# Patient Record
Sex: Male | Born: 1960 | Race: Asian | Hispanic: No | Marital: Single | Smoking: Former smoker
Health system: Southern US, Community
[De-identification: ages and names within clinical notes are randomized; demographics above are authoritative.]

## PROBLEM LIST (undated history)

## (undated) DIAGNOSIS — I10 Essential (primary) hypertension: Secondary | ICD-10-CM

## (undated) DIAGNOSIS — N182 Chronic kidney disease, stage 2 (mild): Secondary | ICD-10-CM

## (undated) DIAGNOSIS — F419 Anxiety disorder, unspecified: Secondary | ICD-10-CM

## (undated) DIAGNOSIS — F32A Depression, unspecified: Secondary | ICD-10-CM

## (undated) DIAGNOSIS — S069XAA Unspecified intracranial injury with loss of consciousness status unknown, initial encounter: Secondary | ICD-10-CM

## (undated) DIAGNOSIS — Z789 Other specified health status: Secondary | ICD-10-CM

## (undated) DIAGNOSIS — F329 Major depressive disorder, single episode, unspecified: Secondary | ICD-10-CM

## (undated) DIAGNOSIS — R569 Unspecified convulsions: Secondary | ICD-10-CM

## (undated) DIAGNOSIS — S069X9A Unspecified intracranial injury with loss of consciousness of unspecified duration, initial encounter: Secondary | ICD-10-CM

## (undated) DIAGNOSIS — M549 Dorsalgia, unspecified: Secondary | ICD-10-CM

## (undated) HISTORY — DX: Anxiety disorder, unspecified: F41.9

## (undated) HISTORY — DX: Major depressive disorder, single episode, unspecified: F32.9

## (undated) HISTORY — DX: Dorsalgia, unspecified: M54.9

## (undated) HISTORY — PX: NO PAST SURGERIES: SHX2092

## (undated) HISTORY — PX: ABDOMINAL SURGERY: SHX537

## (undated) HISTORY — DX: Depression, unspecified: F32.A

---

## 1998-06-23 ENCOUNTER — Inpatient Hospital Stay (HOSPITAL_COMMUNITY): Admission: EM | Admit: 1998-06-23 | Discharge: 1998-06-26 | Payer: Self-pay | Admitting: *Deleted

## 1998-06-25 ENCOUNTER — Encounter: Payer: Self-pay | Admitting: Neurology

## 2001-09-03 ENCOUNTER — Emergency Department (HOSPITAL_COMMUNITY): Admission: EM | Admit: 2001-09-03 | Discharge: 2001-09-03 | Payer: Self-pay

## 2001-09-09 ENCOUNTER — Emergency Department (HOSPITAL_COMMUNITY): Admission: EM | Admit: 2001-09-09 | Discharge: 2001-09-09 | Payer: Self-pay | Admitting: Emergency Medicine

## 2002-04-03 ENCOUNTER — Emergency Department (HOSPITAL_COMMUNITY): Admission: EM | Admit: 2002-04-03 | Discharge: 2002-04-03 | Payer: Self-pay | Admitting: Emergency Medicine

## 2002-09-20 ENCOUNTER — Emergency Department (HOSPITAL_COMMUNITY): Admission: EM | Admit: 2002-09-20 | Discharge: 2002-09-20 | Payer: Self-pay | Admitting: Emergency Medicine

## 2002-10-25 ENCOUNTER — Emergency Department (HOSPITAL_COMMUNITY): Admission: EM | Admit: 2002-10-25 | Discharge: 2002-10-25 | Payer: Self-pay | Admitting: Emergency Medicine

## 2003-01-09 ENCOUNTER — Emergency Department (HOSPITAL_COMMUNITY): Admission: EM | Admit: 2003-01-09 | Discharge: 2003-01-09 | Payer: Self-pay | Admitting: Emergency Medicine

## 2003-06-30 ENCOUNTER — Inpatient Hospital Stay (HOSPITAL_COMMUNITY): Admission: EM | Admit: 2003-06-30 | Discharge: 2003-07-10 | Payer: Self-pay | Admitting: Emergency Medicine

## 2003-10-19 ENCOUNTER — Emergency Department (HOSPITAL_COMMUNITY): Admission: EM | Admit: 2003-10-19 | Discharge: 2003-10-19 | Payer: Self-pay | Admitting: Emergency Medicine

## 2004-03-13 ENCOUNTER — Emergency Department (HOSPITAL_COMMUNITY): Admission: EM | Admit: 2004-03-13 | Discharge: 2004-03-13 | Payer: Self-pay | Admitting: Emergency Medicine

## 2005-06-15 ENCOUNTER — Inpatient Hospital Stay (HOSPITAL_COMMUNITY): Admission: EM | Admit: 2005-06-15 | Discharge: 2005-06-25 | Payer: Self-pay | Admitting: Emergency Medicine

## 2007-11-15 ENCOUNTER — Inpatient Hospital Stay (HOSPITAL_COMMUNITY): Admission: EM | Admit: 2007-11-15 | Discharge: 2007-11-15 | Payer: Self-pay | Admitting: Emergency Medicine

## 2010-03-04 ENCOUNTER — Emergency Department (HOSPITAL_COMMUNITY)
Admission: EM | Admit: 2010-03-04 | Discharge: 2010-03-04 | Payer: Self-pay | Source: Home / Self Care | Admitting: Family Medicine

## 2010-03-04 ENCOUNTER — Emergency Department (HOSPITAL_COMMUNITY)
Admission: EM | Admit: 2010-03-04 | Discharge: 2010-03-05 | Payer: Self-pay | Source: Home / Self Care | Admitting: Emergency Medicine

## 2010-05-12 LAB — COMPREHENSIVE METABOLIC PANEL
ALT: 14 U/L (ref 0–53)
AST: 26 U/L (ref 0–37)
Albumin: 3.9 g/dL (ref 3.5–5.2)
Alkaline Phosphatase: 89 U/L (ref 39–117)
BUN: 10 mg/dL (ref 6–23)
CO2: 23 mEq/L (ref 19–32)
Calcium: 8.8 mg/dL (ref 8.4–10.5)
Chloride: 99 mEq/L (ref 96–112)
Creatinine, Ser: 1.15 mg/dL (ref 0.4–1.5)
GFR calc Af Amer: >60 mL/min (ref >60)
GFR calc non Af Amer: >60 mL/min (ref >60)
Glucose, Bld: 112 mg/dL — ABNORMAL HIGH (ref 70–99)
Potassium: 3.2 mEq/L — ABNORMAL LOW (ref 3.5–5.1)
Sodium: 136 mEq/L (ref 135–145)
Total Bilirubin: 0.3 mg/dL (ref 0.3–1.2)
Total Protein: 8.2 g/dL (ref 6.0–8.3)

## 2010-05-12 LAB — DIFFERENTIAL
Basophils Absolute: 0 10*3/uL (ref 0.0–0.1)
Basophils Relative: 0 % (ref 0–1)
Eosinophils Absolute: 0.1 10*3/uL (ref 0.0–0.7)
Eosinophils Relative: 1 % (ref 0–5)
Lymphocytes Relative: 10 % — ABNORMAL LOW (ref 12–46)
Lymphs Abs: 1.2 10*3/uL (ref 0.7–4.0)
Monocytes Absolute: 0.9 10*3/uL (ref 0.1–1.0)
Monocytes Relative: 7 % (ref 3–12)
Neutro Abs: 10.6 10*3/uL — ABNORMAL HIGH (ref 1.7–7.7)
Neutrophils Relative %: 82 % — ABNORMAL HIGH (ref 43–77)

## 2010-05-12 LAB — CBC
HCT: 37.9 % — ABNORMAL LOW (ref 39.0–52.0)
Hemoglobin: 13 g/dL (ref 13.0–17.0)
MCH: 32.9 pg (ref 26.0–34.0)
MCHC: 34.3 g/dL (ref 30.0–36.0)
MCV: 95.9 fL (ref 78.0–100.0)
Platelets: 316 10*3/uL (ref 150–400)
RBC: 3.95 MIL/uL — ABNORMAL LOW (ref 4.22–5.81)
RDW: 12.9 % (ref 11.5–15.5)
WBC: 12.9 10*3/uL — ABNORMAL HIGH (ref 4.0–10.5)

## 2010-05-12 LAB — URINALYSIS, ROUTINE W REFLEX MICROSCOPIC
Bilirubin Urine: NEGATIVE
Glucose, UA: NEGATIVE mg/dL
Ketones, ur: NEGATIVE mg/dL
Leukocytes, UA: NEGATIVE
Nitrite: NEGATIVE
Protein, ur: NEGATIVE mg/dL
Specific Gravity, Urine: 1.009 (ref 1.005–1.030)
Urobilinogen, UA: 1 mg/dL (ref 0.0–1.0)
pH: 6.5 (ref 5.0–8.0)

## 2010-05-12 LAB — ETHANOL: Alcohol, Ethyl (B): <5 mg/dL (ref 0–10)

## 2010-05-12 LAB — RAPID URINE DRUG SCREEN, HOSP PERFORMED
Amphetamines: NOT DETECTED
Barbiturates: POSITIVE — AB
Benzodiazepines: NOT DETECTED
Cocaine: NOT DETECTED
Opiates: NOT DETECTED
Tetrahydrocannabinol: NOT DETECTED

## 2010-05-12 LAB — PHENYTOIN LEVEL, TOTAL: Phenytoin Lvl: 17.7 ug/mL (ref 10.0–20.0)

## 2010-05-12 LAB — URINE MICROSCOPIC-ADD ON

## 2010-07-15 NOTE — Discharge Summary (Signed)
[name], [name]                   ACCOUNT NO.:  [id number]   MEDICAL RECORD NO.:  [id number]          PATIENT TYPE:  INP   LOCATION:  3012                         FACILITY:  [facility]   PHYSICIAN:  Pramod P. Pearlean Brownie, MD    DATE OF BIRTH:  10-10-1960   DATE OF ADMISSION:  11/14/2007  DATE OF DISCHARGE:                               DISCHARGE SUMMARY   ADDENDUM:   DISPOSITION:  The patient will follow up with Dr. Anne Hahn in 3 to 4  weeks.  He was also discharged home with medications, dilantin 100 mg to  take 2 tabs q. a.m. and 1 tab q. p.m., dispensed 90 with 5 refills.  Pentobarbital prescription 98 mg to take 1 tab p.o. q. a.m. and 1 tab  p.o. q. at bedtime, dispensed #60 with again 5 refills.  At the time of  office visit, the patient will get phenobarbital and dilantin level  checked.  If any questions, please have them call [facility].     ______________________________  Felicie Morn, PA-C    ______________________________  Sunny Schlein. Pearlean Brownie, MD    DS/MEDQ  D:  11/15/2007  T:  11/16/2007  Job:  [id number]

## 2010-07-15 NOTE — Discharge Summary (Signed)
[name], [name]                   ACCOUNT NO.:  [id number]   MEDICAL RECORD NO.:  [id number]          PATIENT TYPE:  INP   LOCATION:  3012                         FACILITY:  [facility]   PHYSICIAN:  Pramod P. Pearlean Brownie, MD    DATE OF BIRTH:  02-20-1961   DATE OF ADMISSION:  11/14/2007  DATE OF DISCHARGE:  11/15/2007                               DISCHARGE SUMMARY   ADMISSION DIAGNOSIS:  Seizure.   DISCHARGE DIAGNOSIS:  Suboptimal phenytoin levels.   HISTORY OF PRESENT ILLNESS:  This patient recently had not refilled his phenytoin prescription, and  as a result of this, patient had a tonic/clonic seizure.  Patient was  brought to the emergency room at [facility], where a phenytoin  level was taken, showing it was low at 6.2, where a normal level is 10-  20.  The patient was at that point admitted to the hospital, loaded with  phenytoin and Dilantin to bring his levels back to normal ranges, kept  overnight to assure the patient was safely within normal limits and not  toxic.  According to the patient, the patient called [facility] to refill a prescription; however, he was  significantly deficient in payment, and the prescription was declined.  Due to this reason, the patient did not refill his prescription nor did  he go to his primary care doctor to refill his phenytoin prescriptions.  Subsequently, the patient suffered from a seizure due to his suboptimal  levels of phenytoin.   PHYSICAL EXAMINATION:  On discharge, November 15, 2007, patient was alert and oriented.  Did  not speak any [place].  However, cranial nerves II-XII were intact.  Patient could move all extremities.  Sensation was grossly intact.  Patient was not exhibiting any seizure or postictal actions.  He was  completely alert.  Brother was at his side.   It was thoroughly discussed with his brother, who speaks good [place],  the importance of taking these medications on a regular basis  in keeping  his blood levels up, both Dilantin and phenytoin, up to optimal levels.  Patient is to follow up with Dr. Anne Hahn in approximately 2-3 weeks and  at that time have a phenytoin and Dilantin level checked.   CHEMISTRIES ON ADMISSION:  White blood cell count was 10.7, hemoglobin 13.9, hematocrit 41.2,  platelets 297.  Sodium 139, potassium 3.5, chloride 103, glucose 107,  CO2 30, BUN 14, creatinine 0.94.   DISPOSITION:  Patient will be discharged home to follow up with Dr. Anne Hahn at [facility] in approximately 2-3 weeks.  At that time, have  Dilantin levels checked.     ______________________________  Felicie Morn, PA-C    ______________________________  Sunny Schlein. Pearlean Brownie, MD    DS/MEDQ  D:  11/15/2007  T:  11/16/2007  Job:  [id number]
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
## 2010-07-18 NOTE — Consult Note (Signed)
NAMELUZ, BURCHER NO.:  [id number]   MEDICAL RECORD NO.:  [id number]                   PATIENT TYPE:  INP   LOCATION:  3308                                 FACILITY:  [facility]   PHYSICIAN:  Melvyn Novas, M.D.               DATE OF BIRTH:  1961-02-11   DATE OF CONSULTATION:  07/02/2003  DATE OF DISCHARGE:                                   CONSULTATION   This is a 50 year old gentleman of [place] origin who was admitted to  [facility] system for intractable seizures in conjunction with  Dilantin toxicity on June 30, 2003.  He is followed by the [facility]. The patient is seen in consultation today by neurology on Jul 02, 2003.  The patient is known to [facility] for 15 years and is  followed by Dr. Lesia Sago.  He has a history of seizure disorder since  childhood.  The history, according to him and is previous outpatient chart  visits, was always elicited with the help of a [name] friend of the  patient.  The patient is described as noncompliant with medications which  was felt to be due to educational deficits.  The patient has a third or  fourth grade education in his home country of [place] but never mastered the  [place] language.  He lives here for over 18 years, and he has been a  difficult historian, according to previous office notes.  He was once seen  in 2004 by Dr. Marcelino Freestone.  At that time, an EEG was obtained which  was abnormal for left-sided electrical discharges, and the patient's  medicine regimen at that time was supervised by a friend or aide, [name].   The patient has a history of Tegretol noncompliance and was switched to  Dilantin to allow him a once-a-day intake.  Dilantin was combined with  phenobarbital already as early as 72.  Past admissions then followed for  Dilantin toxicity in 1991 and in 2000.  Dilantin levels after that were  frequently  subtherapeutic.  The patient then in 2000 decided not to follow  up any longer.  He was then again seen in 2004 after his new primary care  physician, Dr. Laurena Spies, referred him.  At that time, he was reported to  have between three and six seizures weekly, and his phenobarbital level was  17, his Dilantin level 16.  Dilantin was given at that time 400 mg a day,  sometimes described as divided b.i.d., sometimes as a whole dose being taken  at night.  Phenobarbital was 60 mg q.h.s. and a.m.   Normal MRI report for 2001.  Normal CT in 1990, 2001, 2004.  EEG showed left  carotid abnormality, and EEG in 1991 in hospital was read as normal.  Laboratory results vary greatly.  Today he has Dilantin toxicity with  a  level of 68.9.  He has great variability of his Dilantin levels in the past.  He has therapeutic levels for phenobarbital.   SOCIAL HISTORY:  He lives with cousins.  He is not able to speak [place]  fluently.  He denies alcohol or drug abuse in the past.  He is also a  nonsmoker.   FAMILY HISTORY:  There is no family history of epilepsy.  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   PHYSICAL EXAMINATION:  NEUROLOGIC:  Mental Status:  Somnolent, obtunded, not  verbally responsive.  Cranial Nerves:  Pupils react equally to light.  Deviation to gaze to the left without nystagmus.  Disconjugate eye  movements.  No papilledema, no facial droop, no ptosis.  Decreased gag.  Motor Exam:  The patient can withdraw to painful stimuli only. Deep tendon  reflexes are severely attenuated.  Babinski is elicited with upgoing toes  bilaterally.  No clonus could be elicited.  Sensory exam shows noxious  stimuli-provoked response but no responses seen to touch, verbal stimuli, or  even to an ice cube being placed on his chest.  Coordination:  The patient  is not able to comply. Gait and station had to be deferred  as well.   IMPRESSION:  1. Phenytoin toxicity.  2. History of seizure disorder.  3. Possible history of cerebral palsy or childhood brain injury.   The patient was placed on phenytoin and phenobarbital due to a cost factor.  These medications are the cheapest available and cover a variety of  generalized and focal seizure types.  While phenobarbital was in the normal  range, Dilantin was found to be toxic.  I suggested that we should switch  the patient to a once-a-day regimen which makes the compliance easier.  He  should take phenobarbital only at night.  He can take 120 mg, and he should  take Dilantin in p.m. at 300 mg once alert and back into the therapeutic  range. We will check daily Dilantin levels for the next three days.  I  suggested further to either start topiramate, which can be given once a day,  or Keppra, which can be given once a day.  Since the patient still is  supposed to have seizures three to four times weekly, I think it makes sense  to add another substance; however, the cost factor of these medications will  come in to play.   Followup Dilantin levels  #1: During hospitalization daily for the next four  days.  Followup Dilantin levels after discharge:  For the first three  months, monthly; then every three months.  The patient should have  supervised medicine intake.   FOLLOW UP:  The patient is to follow up with Dr. Lesia Sago or Marcelino Freestone, his established neurologists, within the next three months after  discharge.                                               Melvyn Novas, M.D.    CD/MEDQ  D:  07/02/2003  T:  07/02/2003  Job:  [id number]   cc:   Marlan Palau, M.D.  [address]  [address]  [place]  [place] [address]  Fax: [phone]   Laurena Spies, M.D.
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
## 2010-07-18 NOTE — Discharge Summary (Signed)
[name], [name]                               ACCOUNT NO.:  [id number]   MEDICAL RECORD NO.:  [id number]                   PATIENT TYPE:  INP   LOCATION:  3704                                 FACILITY:  [facility]   PHYSICIAN:  Renato Battles, M.D.                  DATE OF BIRTH:  [birth year]   DATE OF ADMISSION:  06/30/2003  DATE OF DISCHARGE:  07/10/2003                                 DISCHARGE SUMMARY   REASON FOR ADMISSION:  Altered mental status.   DISCHARGE DIAGNOSES:  1. Dilantin toxicity.  2. Right pneumonia.  3. Right sinusitis.  4. Psychosis.  5. Post-traumatic stress disorder.  6. Seizure disorder.   DISCHARGE MEDICATIONS:  1. Phenobarbital 120 mg p.o. q.h.s.  2. Topamax 50 mg p.o. daily.  3. Prolixin 5 mg p.o. b.i.d.  4. Paroxetine.  5. Prozac 20 mg p.o. q.a.m.  6. Augmentin 875 mg p.o. b.i.d. until Jul 14, 2003.   CONSULTATIONS:  1. Psychiatry, Dr. ______________.  2. Neurology, Dr. ______________.   PROCEDURES:  The patient had a CT scan on June 30, 2003 which showed only  right upper bilateral nasal sinusitis.   HISTORY OF PRESENT ILLNESS/HOSPITAL COURSE:  The patient is a 50 year old  [name] male who presented to the emergency department and was brought in  by roommate, with complaint of drowsiness and altered mental status for 24  hours prior to admission. On initial workup, he was extremely drowsy and he  does not speak [name], so almost no history was obtained. Most of the  information was given by the roommate.   His physical exam revealed high blood pressure 166/101, heart rate 106. The  rest of the physical exam was negative.   Initial workup was positive for benzodiazepine and barbiturates, traces in  the urine drug screen, and extremely high level of Dilantin was present in  the blood at 39.2. It was felt that the patient's main issue was Dilantin  toxicity. Was admitted for treatment and Dilantin was left to taper off  spontaneously.  Meanwhile, the patient developed shortness of breath and  elevation in white count. Chest x-ray showed extensive right-sided pneumonia  possibly secondary to aspiration. The patient was treated with Unasyn, and  this took care of his shortness of breath and his elevated white count.   Toward the end of the hospital stay, the patient had multiple flares of  agitation and violence. He had to be restrained. Psychiatry and neurology  consults were obtained. Psychiatry started the patient on paroxetine for  possible diagnosis of schizophrenia, and the patient was admitted initially  for 48 hours. Neurologist discontinued Dilantin and replaced it with Topamax  for seizure control. The patient had no seizures during his hospitalization;  however, he continued to have bouts of violence and agitation. Finally, we  had conference with the patient, myself, a cousin, and the other roommate,  which was Mr. [name], who is the primary caregiver, and we found  that the patient is at his baseline and his outbursts are of anger and  frustration because of inability to communicate, and we feel that the  patient was fine just under strain.   They also were told that it was thought that since the patient had been  stable at this level for the last 10-12 years, and he is safe to return home  with them, by them meaning the cousin, and Mr. [name]. Given the fact  that the patient is stable, had no seizure and Dilantin level returned to  therapeutic range and is going to continue taper off as the patient has not  taken any more Dilantin, and also given the fact that commitment papers were  expired, and I have no grounds to renew the commitment papers, I felt that  it was safe to discharge the patient home with his family and follow up with  neurology and psychiatric outpatient.   DISCHARGE DIET:  Regular.   ACTIVITY:  Activity as tolerated.   INSTRUCTIONS:  The patient will require company in  any activity that may  carry risk to him or others in case he has seizure during activity.                                                Renato Battles, M.D.    SA/MEDQ  D:  07/10/2003  T:  07/11/2003  Job:  [id number]

## 2010-07-21 ENCOUNTER — Emergency Department (HOSPITAL_COMMUNITY)
Admission: EM | Admit: 2010-07-21 | Discharge: 2010-07-25 | Disposition: A | Discharge status: Other Acute Inpatient Hospital | Payer: Medicare Other | Attending: Emergency Medicine | Admitting: Emergency Medicine

## 2010-07-21 DIAGNOSIS — F489 Nonpsychotic mental disorder, unspecified: Secondary | ICD-10-CM | POA: Insufficient documentation

## 2010-07-21 DIAGNOSIS — T07XXXA Unspecified multiple injuries, initial encounter: Secondary | ICD-10-CM

## 2010-07-21 DIAGNOSIS — S91009A Unspecified open wound, unspecified ankle, initial encounter: Secondary | ICD-10-CM | POA: Insufficient documentation

## 2010-07-21 DIAGNOSIS — S81009A Unspecified open wound, unspecified knee, initial encounter: Secondary | ICD-10-CM | POA: Insufficient documentation

## 2010-07-21 DIAGNOSIS — R569 Unspecified convulsions: Secondary | ICD-10-CM | POA: Insufficient documentation

## 2010-07-21 DIAGNOSIS — R4182 Altered mental status, unspecified: Secondary | ICD-10-CM | POA: Insufficient documentation

## 2010-07-21 DIAGNOSIS — X789XXA Intentional self-harm by unspecified sharp object, initial encounter: Secondary | ICD-10-CM | POA: Insufficient documentation

## 2010-07-21 DIAGNOSIS — W261XXA Contact with sword or dagger, initial encounter: Secondary | ICD-10-CM

## 2010-07-21 DIAGNOSIS — W260XXA Contact with knife, initial encounter: Secondary | ICD-10-CM

## 2010-07-21 LAB — CBC
HCT: 36.4 % — ABNORMAL LOW (ref 39.0–52.0)
Hemoglobin: 12.5 g/dL — ABNORMAL LOW (ref 13.0–17.0)
MCH: 30.8 pg (ref 26.0–34.0)
MCHC: 34.3 g/dL (ref 30.0–36.0)
MCV: 89.7 fL (ref 78.0–100.0)
Platelets: 396 10*3/uL (ref 150–400)
RBC: 4.06 MIL/uL — ABNORMAL LOW (ref 4.22–5.81)
RDW: 13.2 % (ref 11.5–15.5)
WBC: 4.2 10*3/uL (ref 4.0–10.5)

## 2010-07-21 LAB — ETHANOL: Alcohol, Ethyl (B): <11 mg/dL — ABNORMAL HIGH (ref 0–10)

## 2010-07-21 LAB — RAPID URINE DRUG SCREEN, HOSP PERFORMED
Amphetamines: NOT DETECTED
Barbiturates: POSITIVE — AB
Benzodiazepines: NOT DETECTED
Opiates: NOT DETECTED

## 2010-07-21 LAB — DIFFERENTIAL
Basophils Absolute: 0 10*3/uL (ref 0.0–0.1)
Basophils Relative: 0 % (ref 0–1)
Eosinophils Absolute: 0.2 10*3/uL (ref 0.0–0.7)
Eosinophils Relative: 4 % (ref 0–5)
Lymphocytes Relative: 27 % (ref 12–46)
Lymphs Abs: 1.1 10*3/uL (ref 0.7–4.0)
Monocytes Absolute: 0.3 10*3/uL (ref 0.1–1.0)
Monocytes Relative: 8 % (ref 3–12)
Neutro Abs: 2.5 10*3/uL (ref 1.7–7.7)
Neutrophils Relative %: 61 % (ref 43–77)

## 2010-07-21 LAB — POCT I-STAT, CHEM 8
BUN: 9 mg/dL (ref 6–23)
Calcium, Ion: 1.08 mmol/L — ABNORMAL LOW (ref 1.12–1.32)
Chloride: 102 mEq/L (ref 96–112)
Creatinine, Ser: 1.1 mg/dL (ref 0.4–1.5)
Glucose, Bld: 106 mg/dL — ABNORMAL HIGH (ref 70–99)
HCT: 40 % (ref 39.0–52.0)
Hemoglobin: 13.6 g/dL (ref 13.0–17.0)
Potassium: 3.7 mEq/L (ref 3.5–5.1)
Sodium: 140 mEq/L (ref 135–145)
TCO2: 27 mmol/L (ref 0–100)

## 2010-07-21 LAB — ACETAMINOPHEN LEVEL: Acetaminophen (Tylenol), Serum: <15 ug/mL (ref 10–30)

## 2010-07-21 LAB — SAMPLE TO BLOOD BANK

## 2010-07-21 LAB — PHENYTOIN LEVEL, TOTAL: Phenytoin Lvl: 11.1 ug/mL (ref 10.0–20.0)

## 2010-07-21 LAB — PHENOBARBITAL LEVEL: Phenobarbital: 24.2 ug/mL (ref 15.0–40.0)

## 2010-07-23 DIAGNOSIS — F39 Unspecified mood [affective] disorder: Secondary | ICD-10-CM
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
## 2010-07-25 ENCOUNTER — Emergency Department (HOSPITAL_COMMUNITY)
Admission: EM | Admit: 2010-07-25 | Discharge: 2010-07-31 | Disposition: A | Discharge status: Home / Self Care | Payer: Medicare Other | Attending: Emergency Medicine | Admitting: Emergency Medicine

## 2010-07-25 DIAGNOSIS — F39 Unspecified mood [affective] disorder: Secondary | ICD-10-CM

## 2010-07-25 DIAGNOSIS — F3289 Other specified depressive episodes: Secondary | ICD-10-CM | POA: Insufficient documentation

## 2010-07-25 DIAGNOSIS — G40909 Epilepsy, unspecified, not intractable, without status epilepticus: Secondary | ICD-10-CM | POA: Insufficient documentation

## 2010-07-25 DIAGNOSIS — F329 Major depressive disorder, single episode, unspecified: Secondary | ICD-10-CM | POA: Insufficient documentation

## 2010-07-26 DIAGNOSIS — F329 Major depressive disorder, single episode, unspecified: Secondary | ICD-10-CM

## 2010-07-27 DIAGNOSIS — F329 Major depressive disorder, single episode, unspecified: Secondary | ICD-10-CM

## 2010-07-29 DIAGNOSIS — F39 Unspecified mood [affective] disorder: Secondary | ICD-10-CM

## 2010-07-29 LAB — PHENOBARBITAL LEVEL: Phenobarbital: 15.9 ug/mL (ref 15.0–40.0)

## 2010-07-30 DIAGNOSIS — F39 Unspecified mood [affective] disorder: Secondary | ICD-10-CM

## 2010-07-31 DIAGNOSIS — F39 Unspecified mood [affective] disorder: Secondary | ICD-10-CM
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
## 2010-09-10 ENCOUNTER — Ambulatory Visit (HOSPITAL_COMMUNITY): Payer: Self-pay | Admitting: Psychiatry

## 2010-12-01 LAB — URINALYSIS, ROUTINE W REFLEX MICROSCOPIC
Ketones, ur: NEGATIVE
Nitrite: NEGATIVE
Protein, ur: NEGATIVE
Urobilinogen, UA: 0.2

## 2010-12-01 LAB — COMPREHENSIVE METABOLIC PANEL
ALT: 18
Alkaline Phosphatase: 70
BUN: 14
CO2: 30
GFR calc non Af Amer: >60
Glucose, Bld: 107 — ABNORMAL HIGH
Potassium: 3.5
Sodium: 139
Total Bilirubin: 0.8

## 2010-12-01 LAB — CBC
HCT: 41.2
Hemoglobin: 13.9
RBC: 4.37
RDW: 14.2

## 2010-12-03 LAB — PHENYTOIN LEVEL, TOTAL: Phenytoin Lvl: 6.2 — ABNORMAL LOW

## 2011-12-04 DIAGNOSIS — Z79899 Other long term (current) drug therapy: Secondary | ICD-10-CM | POA: Diagnosis not present

## 2011-12-04 DIAGNOSIS — G40219 Localization-related (focal) (partial) symptomatic epilepsy and epileptic syndromes with complex partial seizures, intractable, without status epilepticus: Secondary | ICD-10-CM | POA: Diagnosis not present

## 2011-12-04 DIAGNOSIS — G40209 Localization-related (focal) (partial) symptomatic epilepsy and epileptic syndromes with complex partial seizures, not intractable, without status epilepticus: Secondary | ICD-10-CM | POA: Diagnosis not present

## 2011-12-04 DIAGNOSIS — G40319 Generalized idiopathic epilepsy and epileptic syndromes, intractable, without status epilepticus: Secondary | ICD-10-CM | POA: Diagnosis not present

## 2011-12-08 DIAGNOSIS — G40219 Localization-related (focal) (partial) symptomatic epilepsy and epileptic syndromes with complex partial seizures, intractable, without status epilepticus: Secondary | ICD-10-CM | POA: Diagnosis not present

## 2011-12-08 DIAGNOSIS — G40319 Generalized idiopathic epilepsy and epileptic syndromes, intractable, without status epilepticus: Secondary | ICD-10-CM | POA: Diagnosis not present

## 2011-12-17 ENCOUNTER — Encounter (HOSPITAL_COMMUNITY): Payer: Self-pay

## 2011-12-17 ENCOUNTER — Emergency Department (HOSPITAL_COMMUNITY): Payer: Medicare Other

## 2011-12-17 ENCOUNTER — Inpatient Hospital Stay (HOSPITAL_COMMUNITY)
Admission: EM | Admit: 2011-12-17 | Discharge: 2011-12-23 | DRG: 100 | Disposition: A | Discharge status: Home / Self Care | Payer: Medicare Other | Attending: Internal Medicine | Admitting: Internal Medicine

## 2011-12-17 ENCOUNTER — Other Ambulatory Visit: Payer: Self-pay

## 2011-12-17 DIAGNOSIS — T420X5A Adverse effect of hydantoin derivatives, initial encounter: Secondary | ICD-10-CM | POA: Diagnosis present

## 2011-12-17 DIAGNOSIS — G9340 Encephalopathy, unspecified: Secondary | ICD-10-CM | POA: Diagnosis not present

## 2011-12-17 DIAGNOSIS — G40409 Other generalized epilepsy and epileptic syndromes, not intractable, without status epilepticus: Secondary | ICD-10-CM

## 2011-12-17 DIAGNOSIS — Z79899 Other long term (current) drug therapy: Secondary | ICD-10-CM

## 2011-12-17 DIAGNOSIS — M79606 Pain in leg, unspecified: Secondary | ICD-10-CM

## 2011-12-17 DIAGNOSIS — I10 Essential (primary) hypertension: Secondary | ICD-10-CM | POA: Diagnosis not present

## 2011-12-17 DIAGNOSIS — R892 Abnormal level of other drugs, medicaments and biological substances in specimens from other organs, systems and tissues: Secondary | ICD-10-CM | POA: Diagnosis not present

## 2011-12-17 DIAGNOSIS — G929 Unspecified toxic encephalopathy: Secondary | ICD-10-CM | POA: Diagnosis present

## 2011-12-17 DIAGNOSIS — G5790 Unspecified mononeuropathy of unspecified lower limb: Secondary | ICD-10-CM | POA: Diagnosis not present

## 2011-12-17 DIAGNOSIS — R569 Unspecified convulsions: Secondary | ICD-10-CM

## 2011-12-17 DIAGNOSIS — M79605 Pain in left leg: Secondary | ICD-10-CM | POA: Diagnosis not present

## 2011-12-17 DIAGNOSIS — Z87891 Personal history of nicotine dependence: Secondary | ICD-10-CM | POA: Diagnosis not present

## 2011-12-17 DIAGNOSIS — D649 Anemia, unspecified: Secondary | ICD-10-CM | POA: Diagnosis not present

## 2011-12-17 DIAGNOSIS — Y92009 Unspecified place in unspecified non-institutional (private) residence as the place of occurrence of the external cause: Secondary | ICD-10-CM

## 2011-12-17 DIAGNOSIS — Z87820 Personal history of traumatic brain injury: Secondary | ICD-10-CM | POA: Diagnosis not present

## 2011-12-17 DIAGNOSIS — E876 Hypokalemia: Secondary | ICD-10-CM | POA: Diagnosis present

## 2011-12-17 DIAGNOSIS — R45851 Suicidal ideations: Secondary | ICD-10-CM

## 2011-12-17 DIAGNOSIS — R259 Unspecified abnormal involuntary movements: Secondary | ICD-10-CM | POA: Diagnosis present

## 2011-12-17 DIAGNOSIS — T420X1A Poisoning by hydantoin derivatives, accidental (unintentional), initial encounter: Secondary | ICD-10-CM | POA: Diagnosis not present

## 2011-12-17 DIAGNOSIS — G40309 Generalized idiopathic epilepsy and epileptic syndromes, not intractable, without status epilepticus: Secondary | ICD-10-CM

## 2011-12-17 DIAGNOSIS — G319 Degenerative disease of nervous system, unspecified: Secondary | ICD-10-CM | POA: Diagnosis not present

## 2011-12-17 DIAGNOSIS — F4310 Post-traumatic stress disorder, unspecified: Secondary | ICD-10-CM | POA: Diagnosis present

## 2011-12-17 DIAGNOSIS — R4182 Altered mental status, unspecified: Secondary | ICD-10-CM

## 2011-12-17 DIAGNOSIS — G40909 Epilepsy, unspecified, not intractable, without status epilepticus: Principal | ICD-10-CM | POA: Diagnosis present

## 2011-12-17 DIAGNOSIS — I999 Unspecified disorder of circulatory system: Secondary | ICD-10-CM | POA: Diagnosis not present

## 2011-12-17 DIAGNOSIS — F329 Major depressive disorder, single episode, unspecified: Secondary | ICD-10-CM | POA: Diagnosis not present

## 2011-12-17 DIAGNOSIS — F411 Generalized anxiety disorder: Secondary | ICD-10-CM | POA: Diagnosis present

## 2011-12-17 DIAGNOSIS — G92 Toxic encephalopathy: Secondary | ICD-10-CM | POA: Diagnosis present

## 2011-12-17 DIAGNOSIS — M79609 Pain in unspecified limb: Secondary | ICD-10-CM | POA: Diagnosis not present

## 2011-12-17 HISTORY — DX: Unspecified convulsions: R56.9

## 2011-12-17 LAB — CBC WITH DIFFERENTIAL/PLATELET
Basophils Absolute: 0 10*3/uL (ref 0.0–0.1)
Eosinophils Absolute: 0.2 10*3/uL (ref 0.0–0.7)
Eosinophils Relative: 2 % (ref 0–5)
HCT: 42.7 % (ref 39.0–52.0)
Lymphocytes Relative: 21 % (ref 12–46)
MCH: 31.6 pg (ref 26.0–34.0)
MCHC: 34.9 g/dL (ref 30.0–36.0)
MCV: 90.7 fL (ref 78.0–100.0)
Monocytes Absolute: 0.5 10*3/uL (ref 0.1–1.0)
RDW: 13.3 % (ref 11.5–15.5)
WBC: 6.7 10*3/uL (ref 4.0–10.5)

## 2011-12-17 LAB — RAPID URINE DRUG SCREEN, HOSP PERFORMED
Barbiturates: POSITIVE — AB
Benzodiazepines: NOT DETECTED
Cocaine: NOT DETECTED

## 2011-12-17 LAB — COMPREHENSIVE METABOLIC PANEL
AST: 22 U/L (ref 0–37)
CO2: 28 mEq/L (ref 19–32)
Calcium: 9 mg/dL (ref 8.4–10.5)
Creatinine, Ser: 0.85 mg/dL (ref 0.50–1.35)
GFR calc Af Amer: >90 mL/min (ref >90)
GFR calc non Af Amer: >90 mL/min (ref >90)
Total Protein: 8.7 g/dL — ABNORMAL HIGH (ref 6.0–8.3)

## 2011-12-17 LAB — URINALYSIS, ROUTINE W REFLEX MICROSCOPIC
Bilirubin Urine: NEGATIVE
Glucose, UA: NEGATIVE mg/dL
Ketones, ur: NEGATIVE mg/dL
pH: 7.5 (ref 5.0–8.0)

## 2011-12-17 LAB — PHENYTOIN LEVEL, TOTAL: Phenytoin Lvl: 20.8 ug/mL — ABNORMAL HIGH (ref 10.0–20.0)

## 2011-12-17 LAB — GLUCOSE, CAPILLARY: Glucose-Capillary: 116 mg/dL — ABNORMAL HIGH (ref 70–99)

## 2011-12-17 MED ORDER — SODIUM CHLORIDE 0.9 % IV SOLN: 250.0000 mL | INTRAVENOUS | Status: DC | PRN

## 2011-12-17 MED ORDER — LACOSAMIDE 50 MG PO TABS
100.0000 mg | ORAL_TABLET | Freq: Two times a day (BID) | ORAL | Status: DC
  Administered 2011-12-17: 100 mg via ORAL
  Filled 2011-12-17: qty 2

## 2011-12-17 MED ORDER — LEVETIRACETAM 750 MG PO TABS
1500.0000 mg | ORAL_TABLET | Freq: Two times a day (BID) | ORAL | Status: DC
  Administered 2011-12-17 – 2011-12-23 (×12): 1500 mg via ORAL
  Filled 2011-12-17 (×13): qty 2

## 2011-12-17 MED ORDER — SODIUM CHLORIDE 0.9 % IJ SOLN
3.0000 mL | Freq: Two times a day (BID) | INTRAMUSCULAR | Status: DC
  Administered 2011-12-17 – 2011-12-23 (×10): 3 mL via INTRAVENOUS

## 2011-12-17 MED ORDER — LORAZEPAM 2 MG/ML IJ SOLN: 0.5000 mg | INTRAMUSCULAR | Status: DC | PRN

## 2011-12-17 MED ORDER — SODIUM CHLORIDE 0.9 % IJ SOLN: 3.0000 mL | Freq: Two times a day (BID) | INTRAMUSCULAR | Status: DC

## 2011-12-17 MED ORDER — HYDRALAZINE HCL 20 MG/ML IJ SOLN
10.0000 mg | Freq: Four times a day (QID) | INTRAMUSCULAR | Status: DC | PRN
  Administered 2011-12-17: 10 mg via INTRAVENOUS
  Filled 2011-12-17: qty 0.5
  Filled 2011-12-17: qty 10

## 2011-12-17 MED ORDER — ONDANSETRON HCL 4 MG/2ML IJ SOLN: 4.0000 mg | Freq: Four times a day (QID) | INTRAMUSCULAR | Status: DC | PRN

## 2011-12-17 MED ORDER — SODIUM CHLORIDE 0.9 % IJ SOLN: 3.0000 mL | INTRAMUSCULAR | Status: DC | PRN

## 2011-12-17 MED ORDER — HYDRALAZINE HCL 20 MG/ML IJ SOLN: 10.0000 mg | Freq: Four times a day (QID) | INTRAMUSCULAR | Status: DC | PRN

## 2011-12-17 MED ORDER — SODIUM CHLORIDE 0.9 % IV SOLN
500.0000 mg | Freq: Once | INTRAVENOUS | Status: AC
  Administered 2011-12-17: 500 mg via INTRAVENOUS
  Filled 2011-12-17: qty 10

## 2011-12-17 MED ORDER — ONDANSETRON HCL 4 MG PO TABS: 4.0000 mg | ORAL_TABLET | Freq: Four times a day (QID) | ORAL | Status: DC | PRN

## 2011-12-17 NOTE — ED Notes (Signed)
Pt is alert to verbal stimuli, flutters eyelids when talked to.  In and out cath completed.  Pt tolerated well.  UA specimen sent to lab

## 2011-12-17 NOTE — Progress Notes (Addendum)
Pt having spastic movements with slight bilateral arm tremors.  Family states this is not normal for pt.  Dr. Roseanne Reno notified, no new orders received.  Dr. Benjamine Mola also notified, orders received.  Will continue to monitor.  Roselie Awkward, RN

## 2011-12-17 NOTE — ED Notes (Signed)
Pt alert to verbal stimuli, remains drowsy. Has flat affect and moans when asked questions

## 2011-12-17 NOTE — ED Notes (Signed)
Family member at bedside. States pt has been on new med x 1 week that has made him drowsy, provider informed

## 2011-12-17 NOTE — ED Notes (Signed)
Report called to [place], RN on 3300

## 2011-12-17 NOTE — Progress Notes (Signed)
Pt admitted to 3300 from ED.  Pt speaks limited English, no family present.  Instructed on use of call bell - placed at pt's side.  BP elevated, will give prn med.    Roselie Awkward, RN

## 2011-12-17 NOTE — Consult Note (Signed)
[facility] CONSULT NOTE     Reason for Consult: Breakthrough generalized seizure.    HPI:    [name] is an 51 y.o. male from [place] with a history of seizure disorder, presenting with a history of recurrent seizure earlier today. He was found on the floor and was living facility lethargic and confused. Patient speaks very little [place]. No one accompanied him to the hospital and could potentially provide a detailed history. CT scan of his head showed no acute intracranial abnormality. Mild diffuse cortical atrophy and white matter ischemic changes were noted. Patient has been taking Dilantin, 200 mg twice a day, Keppra 1500 mg twice a day and Vimpat 100 mg twice a day. His Dilantin level was 4.2, corrected to 4.9. It's unclear along patient was seizure-free prior to today.  Past Medical History  Diagnosis Date  . Seizures     History reviewed. No pertinent past surgical history.  History reviewed. No pertinent family history.  Social History:  does not have a smoking history on file. He does not have any smokeless tobacco history on file. His alcohol and drug histories not on file.  No Known Allergies  Medications:    Prior to Admission:  Prescriptions prior to admission  Medication Sig Dispense Refill  . lacosamide (VIMPAT) 50 MG TABS Take 100 mg by mouth 2 (two) times daily.      [name] levETIRAcetam (KEPPRA) 1000 MG tablet Take 1,500 mg by mouth 2 (two) times daily.      . phenytoin (DILANTIN) 100 MG ER capsule Take 200 mg by mouth 2 (two) times daily.        Review of Systems - unavailable   Blood pressure 165/112, pulse 88, temperature 98 F (36.7 C), temperature source Oral, resp. rate 16, height 5\' 5"  (1.651 m), weight 78.6 kg (173 lb 4.5 oz), SpO2 99.00%.   Neurologic Examination:  Mental Status: Lethargic and unable to understand simple commands.  Cranial Nerves: II-Visual fields were normal. III/IV/VI-Pupils were equal and  reacted. Left exotropia noted. Extraocular movements otherwise normal.  V/VII- no facial weakness. VIII-normal. X-no dysarthria. XII-midline tongue extension Motor: 5/5 bilaterally with normal tone and bulk Sensory: Normal throughout. Deep Tendon Reflexes: 1+ and symmetric. Plantars: Mute bilaterally Cerebellar: Normal finger-to-nose testing. Carotid auscultation: Normal    No results found for this basename: cbc, bmp, coags, chol, tri, ldl, hga1c    Results for orders placed during the hospital encounter of 12/17/11 (from the past 48 hour(s))  GLUCOSE, CAPILLARY     Status: Normal   Collection Time   12/17/11 11:02 AM      Component Value Range Comment   Glucose-Capillary 88  70 - 99 mg/dL   CBC WITH DIFFERENTIAL     Status: Normal   Collection Time   12/17/11 11:12 AM      Component Value Range Comment   WBC 6.7  4.0 - 10.5 K/uL    RBC 4.71  4.22 - 5.81 MIL/uL    Hemoglobin 14.9  13.0 - 17.0 g/dL    HCT 40.9  81.1 - 91.4 %    MCV 90.7  78.0 - 100.0 fL    MCH 31.6  26.0 - 34.0 pg    MCHC 34.9  30.0 - 36.0 g/dL    RDW 78.2  95.6 - 21.3 %    Platelets 364  150 - 400  K/uL    Neutrophils Relative 69  43 - 77 %    Neutro Abs 4.6  1.7 - 7.7 K/uL    Lymphocytes Relative 21  12 - 46 %    Lymphs Abs 1.4  0.7 - 4.0 K/uL    Monocytes Relative 8  3 - 12 %    Monocytes Absolute 0.5  0.1 - 1.0 K/uL    Eosinophils Relative 2  0 - 5 %    Eosinophils Absolute 0.2  0.0 - 0.7 K/uL    Basophils Relative 0  0 - 1 %    Basophils Absolute 0.0  0.0 - 0.1 K/uL   COMPREHENSIVE METABOLIC PANEL     Status: Abnormal   Collection Time   12/17/11 11:12 AM      Component Value Range Comment   Sodium 140  135 - 145 mEq/L    Potassium 3.3 (*) 3.5 - 5.1 mEq/L    Chloride 102  96 - 112 mEq/L    CO2 28  19 - 32 mEq/L    Glucose, Bld 95  70 - 99 mg/dL    BUN 9  6 - 23 mg/dL    Creatinine, Ser 9.60  0.50 - 1.35 mg/dL    Calcium 9.0  8.4 - 45.4 mg/dL    Total Protein 8.7 (*) 6.0 - 8.3 g/dL     Albumin 3.8  3.5 - 5.2 g/dL    AST 22  0 - 37 U/L    ALT 14  0 - 53 U/L    Alkaline Phosphatase 88  39 - 117 U/L    Total Bilirubin 0.2 (*) 0.3 - 1.2 mg/dL    GFR calc non Af Amer >90  >90 mL/min    GFR calc Af Amer >90  >90 mL/min   ETHANOL     Status: Normal   Collection Time   12/17/11 11:12 AM      Component Value Range Comment   Alcohol, Ethyl (B) <11  0 - 11 mg/dL   PHENOBARBITAL LEVEL     Status: Abnormal   Collection Time   12/17/11 11:12 AM      Component Value Range Comment   Phenobarbital 4.2 (*) 15.0 - 40.0 ug/mL   PHENYTOIN LEVEL, TOTAL     Status: Abnormal   Collection Time   12/17/11 11:12 AM      Component Value Range Comment   Phenytoin Lvl 20.8 (*) 10.0 - 20.0 ug/mL   URINE RAPID DRUG SCREEN (HOSP PERFORMED)     Status: Abnormal   Collection Time   12/17/11 11:38 AM      Component Value Range Comment   Opiates NONE DETECTED  NONE DETECTED    Cocaine NONE DETECTED  NONE DETECTED    Benzodiazepines NONE DETECTED  NONE DETECTED    Amphetamines NONE DETECTED  NONE DETECTED    Tetrahydrocannabinol NONE DETECTED  NONE DETECTED    Barbiturates POSITIVE (*) NONE DETECTED   URINALYSIS, ROUTINE W REFLEX MICROSCOPIC     Status: Normal   Collection Time   12/17/11 11:38 AM      Component Value Range Comment   Color, Urine YELLOW  YELLOW    APPearance CLEAR  CLEAR    Specific Gravity, Urine 1.011  1.005 - 1.030    pH 7.5  5.0 - 8.0    Glucose, UA NEGATIVE  NEGATIVE mg/dL    Hgb urine dipstick NEGATIVE  NEGATIVE    Bilirubin Urine NEGATIVE  NEGATIVE  Ketones, ur NEGATIVE  NEGATIVE mg/dL    Protein, ur NEGATIVE  NEGATIVE mg/dL    Urobilinogen, UA 0.2  0.0 - 1.0 mg/dL    Nitrite NEGATIVE  NEGATIVE    Leukocytes, UA NEGATIVE  NEGATIVE MICROSCOPIC NOT DONE ON URINES WITH NEGATIVE PROTEIN, BLOOD, LEUKOCYTES, NITRITE, OR GLUCOSE <1000 mg/dL.    Ct Head Wo Contrast  12/17/2011  *RADIOLOGY REPORT*  Clinical Data: Seizures.  CT HEAD WITHOUT CONTRAST  Technique:   Contiguous axial images were obtained from the base of the skull through the vertex without contrast.  Comparison: March 05, 2010.  Findings: Bony calvarium is intact.  Mild diffuse cortical atrophy is noted.  Ventricular size is within normal limits.  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  There is no evidence of mass lesion, hemorrhage or acute infarction.  IMPRESSION: Mild diffuse cortical atrophy.  Chronic ischemic white matter disease.  No acute intracranial abnormality seen.   Original Report Authenticated By: Venita Sheffield., M.D.      Assessment/Plan:  Breakthrough generalized seizure activity with subtherapeutic level of Dilantin. It's unclear at this point with the patient's been compliant with taking Dilantin. If so he would need higher dose of Dilantin. CT scan showed no indications of acute intracranial abnormality  Recommendations: 1. Fosphenytoin PE 500 mg IV loading dose 2. Continue Dilantin at 200 mg every 12 hours for now 3. No change in current doses Keppra and Vimpat  Venetia Maxon M.D. [facility] [phone]  12/17/2011, 6:24 PM

## 2011-12-17 NOTE — ED Notes (Signed)
EMS reports pt found on the floor unresponsive by roommates this am
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
## 2011-12-17 NOTE — ED Provider Notes (Signed)
History     CSN: [id number]  Arrival date & time 12/17/11  1052   First MD Initiated Contact with Patient 12/17/11 1055      Chief Complaint  Patient presents with  . Seizures    (Consider location/radiation/quality/duration/timing/severity/associated sxs/prior treatment) HPI Comments: Zakariah Fogel 51 y.o. male   The chief complaint is: Patient presents with:  altered mental status  Past Medical History:   Seizures       Depression   Traumatic brain injury                                                Level 5 caveat.  Patient arrived via EMS.  History given by EMS personnel.  Patient was found on the floor by roommates.  When EMS arrived he was unable to communicate .  EMS reports Patient responds to questions with eye opening with and obeys some commands.  Medical records show hx seizure and TBI.  He is followed by Dr. Pearlean Brownie  In Neurology.      Patient is a 51 y.o. male presenting with altered mental status. The history is provided by the EMS personnel. The history is limited by the condition of the patient.  Altered Mental Status This is a new problem. The problem has been unchanged.    Past Medical History  Diagnosis Date  . Seizures     History reviewed. No pertinent past surgical history.  History reviewed. No pertinent family history.  History  Substance Use Topics  . Smoking status: Not on file  . Smokeless tobacco: Not on file  . Alcohol Use:       Review of Systems  Unable to perform ROS Neurological: Positive for seizures.  Psychiatric/Behavioral: Positive for altered mental status.    Allergies  Review of patient's allergies indicates no known allergies.  Home Medications   Current Outpatient Rx  Name Route Sig Dispense Refill  . LACOSAMIDE 50 MG PO TABS Oral Take 100 mg by mouth 2 (two) times daily.    [name] LEVETIRACETAM 1000 MG PO TABS Oral Take 1,500 mg by mouth 2 (two) times daily.    [name] PHENYTOIN SODIUM EXTENDED 100 MG PO CAPS Oral Take  200 mg by mouth 2 (two) times daily.      BP 178/67  Pulse 86  Temp 97.5 F (36.4 C) (Oral)  Resp 18  SpO2 100%  Physical Exam  Nursing note and vitals reviewed. Constitutional: He appears well-developed and well-nourished.  HENT:  Head: Normocephalic and atraumatic.  Eyes: Conjunctivae normal are normal. No scleral icterus.  Neck: Normal range of motion. Neck supple.  Cardiovascular: Normal rate, regular rhythm and normal heart sounds.   Pulmonary/Chest: Effort normal and breath sounds normal. No respiratory distress.  Abdominal: Soft. Bowel sounds are normal.  Musculoskeletal: He exhibits no edema.  Neurological:       Patient opens eyes in response to question. Squeezes fingers lightly. Mumbles incoherently  Skin: Skin is warm and dry.  Psychiatric: His behavior is normal.    ED Course  Procedures (including critical care time)   Labs Reviewed  GLUCOSE, CAPILLARY  CBC WITH DIFFERENTIAL  COMPREHENSIVE METABOLIC PANEL  URINE RAPID DRUG SCREEN (HOSP PERFORMED)  URINALYSIS, ROUTINE W REFLEX MICROSCOPIC  ETHANOL  PHENOBARBITAL LEVEL  PHENYTOIN LEVEL, TOTAL   Ct Head Wo Contrast  12/17/2011  *RADIOLOGY REPORT*  Clinical Data: Seizures.  CT HEAD WITHOUT CONTRAST  Technique:  Contiguous axial images were obtained from the base of the skull through the vertex without contrast.  Comparison: March 05, 2010.  Findings: Bony calvarium is intact.  Mild diffuse cortical atrophy is noted.  Ventricular size is within normal limits.  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  There is no evidence of mass lesion, hemorrhage or acute infarction.  IMPRESSION: Mild diffuse cortical atrophy.  Chronic ischemic white matter disease.  No acute intracranial abnormality seen.   Original Report Authenticated By: Venita Sheffield., M.D.      1. Seizure   2. Altered mental state   3. HTN (hypertension)   4. Seizure disorder, grand mal   5. Dilantin toxicity   6.  Encephalopathy   7. Lower extremity pain      Date: 12/17/2011  Rate: 89  Rhythm: normal sinus rhythm  QRS Axis: normal  Intervals: normal  ST/T Wave abnormalities: nonspecific ST changes  Conduction Disutrbances:none  Narrative Interpretation: normal ECG with LVH  Old EKG Reviewed: changes noted     MDM  12:15 AM Still awaiting labs.  Patent is still lethargic.  He responds to questions and opens his eyes briefly. Response is incoherent mumbling.   1:00 PM Patient brother is present.  He does not speak english well. He states that his brother's roomates called him and said he had a seizure this morning.  He also states that the patient changed meds theis week.  He does not know what he takes or who his neurologist is.    3:45 PM Patient is still extremely lethargic. Still mumbling in response to questions.  I will call for admission.  Patient accepted for admission.  Neuro has agreed to consult.        Arthor Captain, PA-C 12/25/11 2228

## 2011-12-17 NOTE — ED Notes (Signed)
1100- accompanied pt to CT. Pt open eyes to verbal stimuli at times. If arm is held up pt will hold in place

## 2011-12-18 DIAGNOSIS — G40409 Other generalized epilepsy and epileptic syndromes, not intractable, without status epilepticus: Secondary | ICD-10-CM | POA: Diagnosis present

## 2011-12-18 LAB — COMPREHENSIVE METABOLIC PANEL
ALT: 12 U/L (ref 0–53)
AST: 21 U/L (ref 0–37)
Albumin: 3.4 g/dL — ABNORMAL LOW (ref 3.5–5.2)
Alkaline Phosphatase: 78 U/L (ref 39–117)
BUN: 12 mg/dL (ref 6–23)
Potassium: 3.8 mEq/L (ref 3.5–5.1)
Sodium: 139 mEq/L (ref 135–145)
Total Protein: 7.8 g/dL (ref 6.0–8.3)

## 2011-12-18 LAB — CBC
HCT: 43.2 % (ref 39.0–52.0)
MCH: 24.2 pg — ABNORMAL LOW (ref 26.0–34.0)
MCV: 90.8 fL (ref 78.0–100.0)
RDW: 13.5 % (ref 11.5–15.5)
WBC: 6.8 10*3/uL (ref 4.0–10.5)

## 2011-12-18 LAB — IRON AND TIBC
Iron: 87 ug/dL (ref 42–135)
Saturation Ratios: 33 % (ref 20–55)
TIBC: 260 ug/dL (ref 215–435)
UIBC: 173 ug/dL (ref 125–400)

## 2011-12-18 LAB — RETICULOCYTES: Retic Count, Absolute: 34.4 10*3/uL (ref 19.0–186.0)

## 2011-12-18 LAB — FOLATE: Folate: 5.8 ng/mL

## 2011-12-18 MED ORDER — LACOSAMIDE 50 MG PO TABS
150.0000 mg | ORAL_TABLET | Freq: Two times a day (BID) | ORAL | Status: DC
  Administered 2011-12-18 – 2011-12-23 (×11): 150 mg via ORAL
  Filled 2011-12-18 (×11): qty 3

## 2011-12-18 NOTE — Progress Notes (Signed)
[facility] Progress Note [place] TEAM 1 - Stepdown/ICU [name] [place] ZOX:[id number] DOB: Aug 08, 1960 DOA: 12/17/2011 PCP: No primary provider on file.  Brief narrative: 51 y.o. male from [place] brought in by EMS. Per ER record he was found on the floor by roommates. He has a history of seizures. He was given versed by EMS in route to the hospital.  There is mention of a TBI in the ER records as well but no family could be reached to confirm this and patient was still too confused. Per ER record, patient follows with Dr. Pearlean Brownie. ER nurse reported that a new medication was started recently but which one is unknown.  In ER was given no medications, CT scan done with diffuse cortical atrophy, no other major lab abnormalities   Assessment/Plan:  Seizure - Breakthrough generalized seizure activity  CT scan of his head showed no acute intracranial abnormality - was on keppra, dilantin and vimpat - subtherapeutic level of Dilantin - meds being dosed by Neuro - is beginning to wake up - transfer to Neuro unit and cont to follow   Altered mental status Due to postictal state - is slowly clearing   Elevated BP No known hx of HTN - likely related to acute illness - follow trend w/o scheduled tx at this time   Normocytic anemia Was not anemic at admit - likely due to hemodilution w/ IVF - also may be a component of chronic low grade anemia - will check Fe panel and guiac stool - review of old records suggests baseline Hgb of ~13  Mild hypokalemia Resolved w/ IVF and replacement   Hx of TBI and possible PTSD (reported by family)  Code Status: Full Family Communication: no family present in room  Disposition Plan: transfer to Neuro unit   Consultants: Neuro   Procedures: none  Antibiotics: none  DVT prophylaxis: SCDs  HPI/Subjective: Pt speaks little English, but is able to communicate to me that he feels "ok".  Remains somewhat sluggish.  Not agitated.      Objective: Blood pressure 151/93, pulse 80, temperature 98.4 F (36.9 C), temperature source Oral, resp. rate 17, height 5\' 5"  (1.651 m), weight 78.6 kg (173 lb 4.5 oz), SpO2 98.00%.  Intake/Output Summary (Last 24 hours) at 12/18/11 0936 Last data filed at 12/18/11 0804  Gross per 24 hour  Intake      0 ml  Output   1375 ml  Net  -1375 ml     Exam: General: No acute respiratory distress Lungs: Clear to auscultation bilaterally without wheezes or crackles Cardiovascular: Regular rate and rhythm without murmur gallop or rub  Abdomen: Nontender, nondistended, soft, bowel sounds positive, no rebound, no ascites, no appreciable mass Extremities: No significant cyanosis, clubbing, or edema bilateral lower extremities  Data Reviewed: Basic Metabolic Panel:  Lab 12/18/11 8119 12/17/11 1112  NA 139 140  K 3.8 3.3*  CL 100 102  CO2 22 28  GLUCOSE 100* 95  BUN 12 9  CREATININE 0.93 0.85  CALCIUM 9.0 9.0  MG -- --  PHOS -- --   Liver Function Tests:  Lab 12/18/11 0440 12/17/11 1112  AST 21 22  ALT 12 14  ALKPHOS 78 88  BILITOT 0.2* 0.2*  PROT 7.8 8.7*  ALBUMIN 3.4* 3.8   CBC:  Lab 12/18/11 0440 12/17/11 1112  WBC 6.8 6.7  NEUTROABS -- 4.6  HGB 11.5* 14.9  HCT 43.2 42.7  MCV 90.8 90.7  PLT 271 364  Cardiac Enzymes:  Lab 12/17/11 1811  CKTOTAL 251*  CKMB --  CKMBINDEX --  TROPONINI --   CBG:  Lab 12/17/11 1853 12/17/11 1102  GLUCAP 116* 88    Recent Results (from the past 240 hour(s))  MRSA PCR SCREENING     Status: Normal   Collection Time   12/17/11  5:59 PM      Component Value Range Status Comment   MRSA by PCR NEGATIVE  NEGATIVE Final      Studies:  Recent x-ray studies have been reviewed in detail by the Attending Physician  Scheduled Meds:  Reviewed in detail by the Attending Physician   Lonia Blood, MD [facility] Office  [phone] Pager [phone]  On-Call/Text Page:      [email or web address]      password  [email or web address]  If 7PM-7AM, please contact night-coverage [email or web address] Password [email or web address] 12/18/2011, 9:36 AM   LOS: 1 day

## 2011-12-18 NOTE — Progress Notes (Signed)
Utilization review completed.  

## 2011-12-18 NOTE — Progress Notes (Signed)
Subjective: A recurrent seizure activity. Patient had tremors of his upper extremities yesterday evening, radiology which remains unclear. Tremors resolved after a few hours with no recurrence. His mental status was normal according to family members present at the time he had tremors.  Objective: Current vital signs: BP 151/93  Pulse 80  Temp 98.4 F (36.9 C) (Oral)  Resp 17  Ht 5\' 5"  (1.651 m)  Wt 78.6 kg (173 lb 4.5 oz)  BMI 28.84 kg/m2  SpO2 98%  Neurologic Exam: Alert and in no acute distress. Follow simple commands without difficulty. Moved extremities equally with no signs of focal weakness. No tremor noted at rest normal with finger to nose testing bilaterally.  Lab Results: Dilantin level today was 24.7 corrected to 31.7.  Medications:  Scheduled:   . fosPHENYtoin (CEREBYX) IV  500 mg PE Intravenous Once  . lacosamide  150 mg Oral BID  . levETIRAcetam  1,500 mg Oral BID  . sodium chloride  3 mL Intravenous Q12H  . DISCONTD: lacosamide  100 mg Oral BID  . DISCONTD: sodium chloride  3 mL Intravenous Q12H   WUJ:WJXBJY chloride, hydrALAZINE, LORazepam, ondansetron (ZOFRAN) IV, ondansetron, sodium chloride, DISCONTD: hydrALAZINE  Assessment/Plan: Generalized seizure disorder with breakthrough seizures. Etiology for breakthrough seizures is unclear at this point. Patient has not had recurrence of seizure activity since admission.  Dilantin level is toxic. Dilantin we will continue to be held until Dilantin level is again within normal range. Vimpat was increased from 100 mg twice a day to 150 mg twice a day. No change was made in this of Keppra 2000 mg twice a day.  C.R. Roseanne Reno, MD [facility] [phone]  12/18/2011  11:33 AM

## 2011-12-18 NOTE — Progress Notes (Signed)
Report called to Kathlene November, receiving RN on 4N.  Pt transferred to 4N19 via wheelchair with all belongings. Attempted to call [name], pt's contact, with no answer.    Roselie Awkward, RN

## 2011-12-18 NOTE — Clinical Documentation Improvement (Signed)
CHANGE MENTAL STATUS DOCUMENTATION CLARIFICATION   THIS DOCUMENT IS NOT A PERMANENT PART OF THE MEDICAL RECORD  TO RESPOND TO THE THIS QUERY, FOLLOW THE INSTRUCTIONS BELOW:  1. If needed, update documentation for the patient's encounter via the notes activity.  2. Access this query again and click edit on the In [employer].  3. After updating, or not, click F2 to complete all highlighted (required) fields concerning your review. Select "additional documentation in the medical record" OR "no additional documentation provided".  4. Click Sign note button.  5. The deficiency will fall out of your In Basket *Please let us know if you are not able to complete this workflow by phone or e-mail (listed below).         12/18/11  Dear Dr. Sharon Seller Marton Redwood  In an effort to better capture your patient's severity of illness, reflect appropriate length of stay and utilization of resources, a review of the patient medical record has revealed the following indicators.    Based on your clinical judgment, please clarify and document in a progress note and/or discharge summary the clinical condition associated with the following supporting information:  In responding to this query please exercise your independent judgment.  The fact that a query is asked, does not imply that any particular answer is desired or expected.  Possible Clinical Conditions?  _______Encephalopathy (describe type if known)                                Hypertensive                       Metabolic  _______Drug induced confusion/delirium  _______Acute confusion  _______Acute delirium  _______Poisoning / Overdose  _______Other Condition  _______Cannot Clinically Determine     Risk Factors: Elevated BP's on 10/17:     201/117; 196/124; 187/111; 178/67; 173-107; 167/103; 165/112  Signs & Symptoms: AMS due to postictal state/confusion noted per 10/18 progress notes.  Treatment: 10/17: Hydralazine 10mg   IV   Reviewed: AMS/Encephalopathy documented per 10/19 progress notes.  Thank You,  Marciano Sequin,  Clinical Documentation Specialist:  Pager: [phone]  Health Information Management [place]

## 2011-12-19 DIAGNOSIS — G40909 Epilepsy, unspecified, not intractable, without status epilepticus: Secondary | ICD-10-CM | POA: Diagnosis present

## 2011-12-19 DIAGNOSIS — G9340 Encephalopathy, unspecified: Secondary | ICD-10-CM

## 2011-12-19 DIAGNOSIS — T420X1A Poisoning by hydantoin derivatives, accidental (unintentional), initial encounter: Secondary | ICD-10-CM

## 2011-12-19 LAB — CBC
HCT: 41.8 % (ref 39.0–52.0)
Hemoglobin: 14.5 g/dL (ref 13.0–17.0)
MCH: 31.6 pg (ref 26.0–34.0)
MCHC: 34.7 g/dL (ref 30.0–36.0)
MCV: 91.1 fL (ref 78.0–100.0)

## 2011-12-19 NOTE — Progress Notes (Signed)
Subjective: No new complaints. No recurrence of seizure activity.  Objective: Current vital signs: BP 131/79  Pulse 81  Temp 97.7 F (36.5 C) (Oral)  Resp 18  Ht 5\' 5"  (1.651 m)  Wt 78.6 kg (173 lb 4.5 oz)  BMI 28.84 kg/m2  SpO2 98%  Neurologic Exam: Alert in no acute distress. Able to follow simple commands without difficulty. Extraocular movements were full and conjugate without nystagmus. Left exotropia noted at rest. No facial weakness. No significant dysarthria. Moves extremities equally with symmetrical strength.  Lab Results: Dilantin level from today is pending.  Studies/Results: Ct Head Wo Contrast  12/17/2011  *RADIOLOGY REPORT*  Clinical Data: Seizures.  CT HEAD WITHOUT CONTRAST  Technique:  Contiguous axial images were obtained from the base of the skull through the vertex without contrast.  Comparison: March 05, 2010.  Findings: Bony calvarium is intact.  Mild diffuse cortical atrophy is noted.  Ventricular size is within normal limits.  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  There is no evidence of mass lesion, hemorrhage or acute infarction.  IMPRESSION: Mild diffuse cortical atrophy.  Chronic ischemic white matter disease.  No acute intracranial abnormality seen.   Original Report Authenticated By: Venita Sheffield., M.D.     Medications:  Scheduled:   . lacosamide  150 mg Oral BID  . levETIRAcetam  1,500 mg Oral BID  . sodium chloride  3 mL Intravenous Q12H   EAV:WUJWJX chloride, hydrALAZINE, LORazepam, ondansetron (ZOFRAN) IV, ondansetron, sodium chloride  Assessment/Plan: Breakthrough generalized seizure activity with no recurrence since admission. Postictal confusion has resolved. Dilantin level has been slightly toxic. Dilantin has been held. Repeat level today is pending. His dose of Keppra 1500 mg twice a day was continued and Vimpat was increased from 100 mg twice a day to 150 mg twice a day. Dilantin will be restarted when  patient's level has returned to normal range.  C.R. Roseanne Reno, MD [facility] [phone]  12/19/2011  8:27 AM

## 2011-12-19 NOTE — Progress Notes (Signed)
[facility] Progress Note [place] TEAM 1 - Stepdown/ICU [name] [place] [name]:[id number] DOB: April 11, 1960 DOA: 12/17/2011 PCP: No primary provider on file.  Brief narrative: 51 y.o. male from [place] brought in by EMS. Per ER record he was found on the floor by roommates. He has a history of seizures. He was given versed by EMS in route to the hospital.  There is mention of a TBI in the ER records as well but no family could be reached to confirm this and patient was still too confused. Per ER record, patient follows with Dr. Pearlean Brownie. ER nurse reported that a new medication was started recently but which one is unknown.  In ER was given no medications, CT scan done with diffuse cortical atrophy, no other major lab abnormalities   Assessment/Plan:  Seizure - Breakthrough generalized seizure activity  CT scan of his head showed no acute intracranial abnormality - was on keppra, dilantin and vimpat - Dilantin toxic and has been held. Neurology following. Continue Vimpat and Keppra.  Dilantin toxicity Dilantin held. Monitor Dilantin levels and resume when levels are normal. Serum Dilantin level 23.7.  Altered mental status/Encephalopathy Due to postictal state - is slowly clearing. And Dilantin toxicity the Per nurses discussion with patient's family today, patient's mental status at baseline today. Patient expresses "I don't want to live anymore" which he has done for many years but has no plans to kill himself and family does not think that he is at risk to harm himself. Sitter was placed by nursing for safety. Will request a psychiatry consult  Elevated BP No known hx of HTN - likely related to acute illness - follow trend w/o scheduled tx at this time   Normocytic anemia Was not anemic at admit - likely due to hemodilution w/ IVF - also may be a component of chronic low grade anemia - will check Fe panel and guiac stool - review of old records suggests baseline Hgb of  ~13  Mild hypokalemia Resolved w/ IVF and replacement   Hx of TBI and possible PTSD (reported by family)  ?? Thoughts of not wanting to live-?cultural comment versus rule out depression. See discussion above. Language barrier to communicate with the patient. Attempted to use an interpreter phone without success.  Code Status: Full Family Communication: no family present in room  Disposition Plan: Home  Consultants: Neuro   Procedures: none  Antibiotics: none  DVT prophylaxis: SCDs  HPI/Subjective: Unable to communicate with the patient secondary to language barrier. Unsuccessful use of interpreter phone. No volunteer interpreter in-house. Per nurses discussion with family, mental status at baseline.    Objective: Blood pressure 130/81, pulse 94, temperature 98.6 F (37 C), temperature source Oral, resp. rate 18, height 5\' 5"  (1.651 m), weight 78.6 kg (173 lb 4.5 oz), SpO2 95.00%.  Intake/Output Summary (Last 24 hours) at 12/19/11 1440 Last data filed at 12/19/11 1300  Gross per 24 hour  Intake    820 ml  Output    300 ml  Net    520 ml     Exam: General: No acute respiratory distress Lungs: Clear to auscultation bilaterally without wheezes or crackles Cardiovascular: Regular rate and rhythm without murmur gallop or rub  Abdomen: Nontender, nondistended, soft, bowel sounds positive, no rebound, no ascites, no appreciable mass Extremities: No significant cyanosis, clubbing, or edema bilateral lower extremities Central nervous system: Patient appears slightly somnolent-but easily arousable. No cranial nerve deficits. Mumbles  Data Reviewed: Basic Metabolic Panel:  Lab 12/18/11 0440 12/17/11 1112  NA 139 140  K 3.8 3.3*  CL 100 102  CO2 22 28  GLUCOSE 100* 95  BUN 12 9  CREATININE 0.93 0.85  CALCIUM 9.0 9.0  MG -- --  PHOS -- --   Liver Function Tests:  Lab 12/18/11 0440 12/17/11 1112  AST 21 22  ALT 12 14  ALKPHOS 78 88  BILITOT 0.2* 0.2*  PROT 7.8  8.7*  ALBUMIN 3.4* 3.8   CBC:  Lab 12/19/11 0610 12/18/11 0440 12/17/11 1112  WBC 9.2 6.8 6.7  NEUTROABS -- -- 4.6  HGB 14.5 11.5* 14.9  HCT 41.8 43.2 42.7  MCV 91.1 90.8 90.7  PLT 387 271 364   Cardiac Enzymes:  Lab 12/17/11 1811  CKTOTAL 251*  CKMB --  CKMBINDEX --  TROPONINI --   CBG:  Lab 12/17/11 1853 12/17/11 1102  GLUCAP 116* 88    Recent Results (from the past 240 hour(s))  MRSA PCR SCREENING     Status: Normal   Collection Time   12/17/11  5:59 PM      Component Value Range Status Comment   MRSA by PCR NEGATIVE  NEGATIVE Final      Studies:  Recent x-ray studies have been reviewed in detail by the Attending Physician  Scheduled Meds:  Reviewed in detail by the Attending Physician   [facility] Office  [phone] Pager [phone]  On-Call/Text Page:      [email or web address]      password [email or web address]  If 7PM-7AM, please contact night-coverage [email or web address] Password [email or web address] 12/19/2011, 2:40 PM   LOS: 2 days

## 2011-12-19 NOTE — Evaluation (Signed)
Physical Therapy Evaluation Patient Details Name: [name] MRN: [id number] DOB: [birth year] Today's Date: 12/19/2011 Time: 1478-2956 PT Time Calculation (min): 25 min  PT Assessment / Plan / Recommendation Clinical Impression  Patient is a 51 yo male admitted following seizure/AMS.  Per [facility] (contact) patient was independent pta with ambulation, ADL's, able to assist with cleaning house, raking leaves, etc.  Today, patient with significant decrease in gait/balance.  Patient will need 24 hour assist at home for mobility/safety.  Recommend RW, shower seat, and HHPT at discharge.  Patient will benefit from acute PT to maximize independence prior to discharge.    PT Assessment  Patient needs continued PT services    Follow Up Recommendations  Home health PT;Supervision/Assistance - 24 hour    Does the patient have the potential to tolerate intense rehabilitation      Barriers to Discharge   Mr. Washington reports patient has 24 hour assist available.    Equipment Recommendations  Rolling walker with 5" wheels;Tub/shower seat    Recommendations for Other Services     Frequency Min 3X/week    Precautions / Restrictions Precautions Precautions: Fall Restrictions Weight Bearing Restrictions: No   Pertinent Vitals/Pain       Mobility  Bed Mobility Bed Mobility: Supine to Sit;Sit to Supine Supine to Sit: 7: Independent;HOB flat Sit to Supine: 7: Independent;HOB flat Details for Bed Mobility Assistance: No cues or assist needed Transfers Transfers: Sit to Stand;Stand to Sit Sit to Stand: 4: Min guard;With upper extremity assist;From bed Stand to Sit: 4: Min guard;With upper extremity assist;To bed Details for Transfer Assistance: Verbal and visual cues for safety during transfers.  Assist for safety and balance Ambulation/Gait Ambulation/Gait Assistance: 4: Min assist Ambulation Distance (Feet): 122 Feet Assistive device: Rolling walker Ambulation/Gait  Assistance Details: Verbal, tactile, and visual cues for safe use of RW.  Patient pushing RW too far in front of himself.  Patient with decreased balance with gait, requiring physical assist for safety.  Attempted gait without RW - patient unable to step without losing balance.  Increased weight on UE's on RW during gait. Gait Pattern: Step-through pattern;Decreased stride length;Ataxic;Trunk flexed Gait velocity: Slow gait speed Stairs: No           PT Diagnosis: Difficulty walking;Abnormality of gait;Altered mental status  PT Problem List: Decreased activity tolerance;Decreased balance;Decreased mobility;Decreased knowledge of use of DME;Decreased safety awareness PT Treatment Interventions: DME instruction;Gait training;Stair training;Functional mobility training;Balance training;Patient/family education   PT Goals Acute Rehab PT Goals PT Goal Formulation: With patient/family Time For Goal Achievement: 12/26/11 Potential to Achieve Goals: Good Pt will go Sit to Stand: with modified independence;with upper extremity assist PT Goal: Sit to Stand - Progress: Goal set today Pt will go Stand to Sit: with modified independence;with upper extremity assist PT Goal: Stand to Sit - Progress: Goal set today Pt will Ambulate: >150 feet;with modified independence;with rolling walker PT Goal: Ambulate - Progress: Goal set today Pt will Go Up / Down Stairs: 3-5 stairs;with supervision;with rail(s) PT Goal: Up/Down Stairs - Progress: Goal set today  Visit Information  Last PT Received On: 12/19/11 Assistance Needed: +1    Subjective Data  Subjective: "Yes/no" answers to questions. Patient Stated Goal: None stated   Prior Functioning  Home Living Lives With: Friend(s) (brother) Available Help at Discharge: Friend(s);Available 24 hours/day Type of Home: Apartment Home Access: Stairs to enter Entrance Stairs-Number of Steps: 3 Entrance Stairs-Rails: Right;Left Home Layout: One  level Bathroom Shower/Tub: Engineer, manufacturing systems: Standard Home  Adaptive Equipment: None Additional Comments: Information obtained from [facility] - contact person. Prior Function Level of Independence: Independent Able to Take Stairs?: Yes Driving: No Vocation: On disability Comments: Information obtained from [facility] - contact person. Communication Communication: Prefers language other than English    Cognition  Overall Cognitive Status: Difficult to assess Difficult to assess due to: Non-English speaking Arousal/Alertness: Lethargic Behavior During Session: Lethargic Cognition - Other Comments: Difficult to arouse initially.  Patient contact [facility]) reports patient has history of memory deficits - short and long term.    Extremity/Trunk Assessment Right Upper Extremity Assessment RUE ROM/Strength/Tone: Within functional levels Left Upper Extremity Assessment LUE ROM/Strength/Tone: Within functional levels Right Lower Extremity Assessment RLE ROM/Strength/Tone: WFL for tasks assessed RLE Coordination: Deficits RLE Coordination Deficits: Ataxic movement noted, especially in gait. Left Lower Extremity Assessment LLE ROM/Strength/Tone: WFL for tasks assessed LLE Coordination: Deficits LLE Coordination Deficits: Ataxic movement noted, especially in gait.   Balance Balance Balance Assessed: Yes Dynamic Sitting Balance Dynamic Sitting - Balance Support: No upper extremity supported;Feet supported Dynamic Sitting - Level of Assistance: 5: Stand by assistance Dynamic Sitting Balance - Compensations: Patient able to maintain sitting balance during sitting activities x 4 minutes. Static Standing Balance Static Standing - Balance Support: Right upper extremity supported;Left upper extremity supported Static Standing - Level of Assistance: 4: Min assist Static Standing - Comment/# of Minutes: Patient with decreased balance in standing, requiring  increased use of UE's on RW to maintain balance.  End of Session PT - End of Session Equipment Utilized During Treatment: Gait belt Activity Tolerance: Patient limited by fatigue (Lethargic during session) Patient left: in bed;with call bell/phone within reach;with bed alarm set Nurse Communication: Mobility status  GP     Vena Austria 12/19/2011, 10:36 AM Durenda Hurt. Renaldo Fiddler, [facility] Acute Rehab Services Pager [phone]

## 2011-12-19 NOTE — Progress Notes (Addendum)
Patient family at bedside. Spoke with family about patient mental status.  Patient is alert and oriented to person, place, time, but not situation.  They reported that he does not know when he has seizures.  They also stated that he always makes comments "I don't want to live anymore" and has had suicidal thoughts x 23 years, but patient does not have a plan to harm himself.  Patient family prepares patient medication in 7 day container, and patient takes medication by himself.  Family feels patient is not a harm to self, as he has the opportunity to harm self, but never has in 23 years.  They said he has suffered from depression due to being on disability, and having no wife or kids.

## 2011-12-20 DIAGNOSIS — F329 Major depressive disorder, single episode, unspecified: Secondary | ICD-10-CM

## 2011-12-20 DIAGNOSIS — F3289 Other specified depressive episodes: Secondary | ICD-10-CM

## 2011-12-20 LAB — ALBUMIN: Albumin: 3.2 g/dL — ABNORMAL LOW (ref 3.5–5.2)

## 2011-12-20 LAB — PHENYTOIN LEVEL, TOTAL: Phenytoin Lvl: 19.2 ug/mL (ref 10.0–20.0)

## 2011-12-20 NOTE — Progress Notes (Signed)
[facility] Progress Note [place] TEAM 1 - Stepdown/ICU [name] Ahner MVH:[id number] DOB: December 27, 1960 DOA: 12/17/2011 PCP: No primary provider on file.  Brief narrative: 51 y.o. male from [place] brought in by EMS. Per ER record he was found on the floor by roommates. He has a history of seizures. He was given versed by EMS in route to the hospital.  There is mention of a TBI in the ER records as well but no family could be reached to confirm this and patient was still too confused. Per ER record, patient follows with Dr. Pearlean Brownie. ER nurse reported that a new medication was started recently but which one is unknown.  In ER was given no medications, CT scan done with diffuse cortical atrophy, no other major lab abnormalities   Assessment/Plan:  Seizure - Breakthrough generalized seizure activity  CT scan of his head showed no acute intracranial abnormality - was on keppra, dilantin and vimpat - Dilantin toxic and has been held. Neurology following. Continue Vimpat and Keppra.  Dilantin toxicity Dilantin held. Monitor Dilantin levels and resume when levels are normal. Serum Dilantin level 25.9. Continue to hold Dilantin  Altered mental status/Encephalopathy Due to postictal state And Dilantin toxicity. the Per nurses discussion with patient's family 10/19, patient's mental status at baseline. Patient expresses "I don't want to live anymore" 10/19 which he has done for many years but has no plans to kill himself and family does not think that he is at risk to harm himself. Sitter was placed by nursing for safety. Psychiatry consulted-input pending. Patient is alert.  Elevated BP No known hx of HTN - likely related to acute illness - follow trend w/o scheduled tx at this time   Normocytic anemia Was not anemic at admit - likely due to hemodilution w/ IVF - also may be a component of chronic low grade anemia - will check Fe panel and guiac stool - review of old records suggests  baseline Hgb of ~13. Hemoglobin normal  Mild hypokalemia Repleted.  Hx of TBI and possible PTSD (reported by family)  ?? Thoughts of not wanting to live-?cultural comment versus rule out depression. See discussion above. Language barrier to communicate with the patient. Attempted to use an interpreter phone without success.  Code Status: Full Family Communication: no family present in room  Disposition Plan: Home  Consultants: Neuro   Procedures: none  Antibiotics: none  DVT prophylaxis: SCDs  HPI/Subjective: No acute events per nursing. Patient indicates he is okay   Objective: Blood pressure 130/82, pulse 98, temperature 98.4 F (36.9 C), temperature source Oral, resp. rate 18, height 5\' 5"  (1.651 m), weight 78.6 kg (173 lb 4.5 oz), SpO2 97.00%.  Intake/Output Summary (Last 24 hours) at 12/20/11 1354 Last data filed at 12/19/11 2103  Gross per 24 hour  Intake      3 ml  Output      0 ml  Net      3 ml     Exam: General: No acute respiratory distress Lungs: Clear to auscultation bilaterally without wheezes or crackles Cardiovascular: Regular rate and rhythm without murmur gallop or rub  Abdomen: Nontender, nondistended, soft, bowel sounds positive, no rebound, no ascites, no appreciable mass Extremities: No significant cyanosis, clubbing, or edema bilateral lower extremities Central nervous system: Alert. No cranial nerve deficits.   Data Reviewed: Basic Metabolic Panel:  Lab 12/18/11 8413 12/17/11 1112  NA 139 140  K 3.8 3.3*  CL 100 102  CO2 22  28  GLUCOSE 100* 95  BUN 12 9  CREATININE 0.93 0.85  CALCIUM 9.0 9.0  MG -- --  PHOS -- --   Liver Function Tests:  Lab 12/20/11 0850 12/18/11 0440 12/17/11 1112  AST -- 21 22  ALT -- 12 14  ALKPHOS -- 78 88  BILITOT -- 0.2* 0.2*  PROT -- 7.8 8.7*  ALBUMIN 3.2* 3.4* 3.8   CBC:  Lab 12/19/11 0610 12/18/11 0440 12/17/11 1112  WBC 9.2 6.8 6.7  NEUTROABS -- -- 4.6  HGB 14.5 11.5* 14.9  HCT 41.8  43.2 42.7  MCV 91.1 90.8 90.7  PLT 387 271 364   Cardiac Enzymes:  Lab 12/17/11 1811  CKTOTAL 251*  CKMB --  CKMBINDEX --  TROPONINI --   CBG:  Lab 12/17/11 1853 12/17/11 1102  GLUCAP 116* 88    Recent Results (from the past 240 hour(s))  MRSA PCR SCREENING     Status: Normal   Collection Time   12/17/11  5:59 PM      Component Value Range Status Comment   MRSA by PCR NEGATIVE  NEGATIVE Final      Studies:  Recent x-ray studies have been reviewed in detail by the Attending Physician  Scheduled Meds:  Reviewed in detail by the Attending Physician   [facility] Office  [phone] Pager [phone]  On-Call/Text Page:      [email or web address]      password [email or web address]  If 7PM-7AM, please contact night-coverage [email or web address] Password [email or web address] 12/20/2011, 1:54 PM   LOS: 3 days

## 2011-12-20 NOTE — Consult Note (Signed)
Reason for Consult:Depression Referring Physician:  Dr. Dyke [name] [name] is an 51 y.o. male.  HPI: Patient was seen and chart reviewed. Patient has limited english and he has no family or friends at bed side. No interpreter available on weekend. Information obtained from chart and case discussed with Dr. Waymon [name] and staff nurse. Reportedly he lives with a couple of friends who speak some english. He was suffering with epileptic seizures and history of traumatic brain injury. He was found at home unresponsive and brought in by emergency medical services. Patient has made statements of no interest in living but he could not reliable historian at this time. He has been mumbling and has language barrier to complete meaningful psychiatric evaluation. He was queried several times about depression,sadness, tearful and suicidal ideations, intension and plans without reliable and specific response. He has decreased psychomotor activity and poor eye contact. He is trying to show his left leg and no specific complaints. Patient has no agitation, aggressive or combative behaviors. He does not appear responding to internal stimuli.  Past Medical History  Diagnosis Date  . Seizures     History reviewed. No pertinent past surgical history.  History reviewed. No pertinent family history.  Social History:  reports that he has quit smoking. His smoking use included Cigarettes. He does not have any smokeless tobacco history on file. His alcohol and drug histories not on file.  Allergies: No Known Allergies  Medications: I have reviewed the patient's current medications.  Results for orders placed during the hospital encounter of 12/17/11 (from the past 48 hour(s))  CBC     Status: Normal   Collection Time   12/19/11  6:10 AM      Component Value Range Comment   WBC 9.2  4.0 - 10.5 K/uL    RBC 4.59  4.22 - 5.81 MIL/uL    Hemoglobin 14.5  13.0 - 17.0 g/dL DELTA CHECK NOTED   HCT 41.8  39.0 - 52.0 %    MCV 91.1  78.0 - 100.0 fL    MCH 31.6  26.0 - 34.0 pg    MCHC 34.7  30.0 - 36.0 g/dL    RDW 16.1  09.6 - 04.5 %    Platelets 387  150 - 400 K/uL   PHENYTOIN LEVEL, TOTAL     Status: Abnormal   Collection Time   12/19/11  8:26 AM      Component Value Range Comment   Phenytoin Lvl 23.7 (*) 10.0 - 20.0 ug/mL   PHENYTOIN LEVEL, TOTAL     Status: Normal   Collection Time   12/20/11  8:50 AM      Component Value Range Comment   Phenytoin Lvl 19.2  10.0 - 20.0 ug/mL   ALBUMIN     Status: Abnormal   Collection Time   12/20/11  8:50 AM      Component Value Range Comment   Albumin 3.2 (*) 3.5 - 5.2 g/dL     No results found.  Positive for bad mood and decreased psychomotor activity. Blood pressure 134/77, pulse 88, temperature 98.1 F (36.7 C), temperature source Oral, resp. rate 18, height 5\' 5"  (1.651 m), weight 173 lb 4.5 oz (78.6 kg), SpO2 97.00%.   Assessment/Plan: Depression disorder NOS   Recommendation: Will arrange live interpreter for psychiatric assessment and request Dr. Ferol [name] to follow up on Monday.    [name],Damon R. 12/20/2011, 3:27 PM

## 2011-12-20 NOTE — Progress Notes (Signed)
Discussed with floor charge nurse who discussed with [name] (house coverage), patient will not be assigned a safety/suicide sitter due to the fact he is not expressing suicidal thoughts with a plan of action.  MD notified.  Will continue to monitor patient. [name] 12/20/2011 2:07 PM

## 2011-12-21 DIAGNOSIS — M79609 Pain in unspecified limb: Secondary | ICD-10-CM

## 2011-12-21 DIAGNOSIS — M79605 Pain in left leg: Secondary | ICD-10-CM | POA: Diagnosis not present

## 2011-12-21 DIAGNOSIS — M79606 Pain in leg, unspecified: Secondary | ICD-10-CM | POA: Diagnosis not present

## 2011-12-21 LAB — URIC ACID: Uric Acid, Serum: 6.7 mg/dL (ref 4.0–7.8)

## 2011-12-21 MED ORDER — ACETAMINOPHEN 325 MG PO TABS
650.0000 mg | ORAL_TABLET | Freq: Four times a day (QID) | ORAL | Status: DC | PRN
  Administered 2011-12-21 – 2011-12-22 (×3): 650 mg via ORAL
  Filled 2011-12-21 (×3): qty 2

## 2011-12-21 MED ORDER — PHENYTOIN SODIUM EXTENDED 100 MG PO CAPS
100.0000 mg | ORAL_CAPSULE | Freq: Two times a day (BID) | ORAL | Status: DC
  Filled 2011-12-21 (×2): qty 1

## 2011-12-21 NOTE — Progress Notes (Addendum)
Subjective: He  Points to his legs showing me movements. He is not having a seizure. I asked if he was shaking before, he said yes. There is a language barrier. Attempts to get an interpreter are in place from discussion with staff.  Objective: Current vital signs: BP 135/84  Pulse 81  Temp 97.9 F (36.6 C) (Oral)  Resp 20  Ht 5\' 5"  (1.651 m)  Wt 78.6 kg (173 lb 4.5 oz)  BMI 28.84 kg/m2  SpO2 98%  Neurologic Exam: Alert in no acute distress. Able to follow simple commands without difficulty. Extraocular movements were full and conjugate without nystagmus. Left exotropia noted at rest. No facial weakness. No significant dysarthria. Moves extremities equally with symmetrical strength.  Lab Results: Dilantin level from today was 16.5, corrected to 22.3 (still slightly toxic).  Studies/Results:  CT Head Wo Contrast 12/17/2011: Mild diffuse cortical atrophy. Chronic ischemic white matter disease. No acute intracranial abnormality seen.   Medications:  Scheduled:    . lacosamide  150 mg Oral BID  . levETIRAcetam  1,500 mg Oral BID  . sodium chloride  3 mL Intravenous Q12H   GNF:AOZHYQ chloride, hydrALAZINE, LORazepam, ondansetron (ZOFRAN) IV, ondansetron, sodium chloride  Assessment/Plan: Breakthrough generalized seizure activity. Postictal confusion seems to have resolved. No documented seizure activity, but will restart dilantin from 200mg  twice daily to 200 mg in a.m. and 150 mg Hs when his corrected level of Dilantin returns to normal range. His dose of Keppra 1500 mg twice a day was continued and Vimpat was increased from 100 mg twice a day to 150 mg twice a day.   No further neurologic intervention is recommended at this time.  If further questions arise, please call or page at that time.  Thank you for allowing neurology to participate in the care of this patient. Please have the patient make arrangements for follow up with his neurologist (Dr. Pearlean Brownie) in the office within  the next 2 weeks after discharge.  Job Founds, MBA, MHA [facility] Pager [phone]   12/21/2011  11:31 AM        C.R. Roseanne Reno, MD [facility] [phone]  12/21/2011  11:25 AM

## 2011-12-21 NOTE — Progress Notes (Addendum)
   CARE MANAGEMENT NOTE 12/21/2011  Patient:  [name],[name]   Account Number:  [id number]  Date Initiated:  12/21/2011  Documentation initiated by:  [facility]  Subjective/Objective Assessment:   Seizures     Action/Plan:   [name] [name]   Anticipated DC Date:  12/23/2011   Anticipated DC Plan:  HOME W HOME HEALTH SERVICES      DC Planning Services  CM consult      Choice offered to / List presented to:             Status of service:  In process, will continue to follow Medicare Important Message given?   (If response is "NO", the following Medicare IM given date fields will be blank) Date Medicare IM given:   Date Additional Medicare IM given:    Discharge Disposition:    Per UR Regulation:    If discussed at Long Length of Stay Meetings, dates discussed:    Comments:  12/22/2010 1100 NCM spoke to pt's caregiver, [name]. States pt has Medicaid. NCM verified with admitting and showing inactive. Encouraged him to speak with his Medicaid Casework to have him recertified. Caregiver states that is how he gets pt's meds. Damon Donning RN CCM Case Mgmt phone [phone]      12/20/2011 1245 [name] in room is understands same dialect as pt. They are from same [place]. Pt lives at home with his caregiver, [name], [phone]. He handles his finances and care. Pt receives a monthly disability check of $700 and he is able to pay for meds at home. [name] purchases his meds for him. He will have PT/OT evaluation. Pt states he is having difficulty with walking since being in hospital. Will possibly need RW and 3n1 for home and HH. NCM will continue to follow up until d/c. Damon Donning RN CCM Case Mgmt phone [phone]  12/21/2011 1200 Pt will need a [name]. Damon Donning RN CCM Case Mgmt phone [phone]

## 2011-12-21 NOTE — Progress Notes (Signed)
Progress Note following Consult Patient Identification:  [name] Date of Evaluation:  12/21/2011 Reason for Consult:  Suicidal Ideation, Manor Depressive Disorder  Referring Provider: Dr. Waymon Amato  History of Present Illness:Pt is a [name], relocated to [place] after leaving en mass with other escapees.  He has lived here 27 years.  The interpreter also came from [place], [place] from the Linguist Service:  [phone].  Pt and translator knew of each other in [place] and here.  Pt says he feel confused today.  He has no family her and no friends.  [translator lives elsewhere].  Pt says he has pains in his legs  He has been depressed.  He lives with two roommates.  One is [name] and the other is from [place], [name].  He denies drinking liquor but drinks coffee two cups a day. He denies use of drugs and cigarettes.  He tries to explain why his legs hurt:  He was walking, fell into a hole and hurt his legs.  He has the thought of wanting to die but does not want to hurt himself [but has ideas how he would hurt himself - passive thoughts]. He has an appetite, and sleeps  all right.  He has no parents here he did have 3 sisters and one brother.  Past Psychiatric History: He has PTSD, Maj. depressive episode with passive suicidal ideation  Past Medical History:     Past Medical History  Diagnosis Date  . Seizures       History reviewed. No pertinent past surgical history.  Allergies: No Known Allergies  Current Medications:  Prior to Admission medications   Medication Sig Start Date End Date Taking? Authorizing Provider  lacosamide (VIMPAT) 50 MG TABS Take 100 mg by mouth 2 (two) times daily.   Yes Historical Provider, MD  levETIRAcetam (KEPPRA) 1000 MG tablet Take 1,500 mg by mouth 2 (two) times daily.   Yes Historical Provider, MD  phenytoin (DILANTIN) 100 MG ER capsule Take 200 mg by mouth 2 (two) times daily.   Yes Historical Provider, MD    Social History:    reports that  he has quit smoking. His smoking use included Cigarettes. He does not have any smokeless tobacco history on file. His alcohol and drug histories not on file.   Family History:    History reviewed. No pertinent family history.  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  AXIS IV educational problems, other psychosocial or environmental problems, problems related to social environment and epikesy and physical, mobility problems; pain sx.  AXIS V 51-60 moderate symptoms   Assessment/Plan: Discussed with Dr.Hongalgi  [name] translater is thanked:  [phone] This patient speaks only [name] dialect which is translated today. He has a very concrete way of explaining his situation and his moods. He denies other major health problems. He has disability of $758 per month. He lives with 2 other  roommates one from [place] the other [place]. They divide the expenses and the chores. As he describes this arrangement at sounds very compatible. He offers no complaints. He does have problems with his lower extremities. He explains the problem as he happened to fall into a hole queen in traffic. He said he's had no x-ray of the problem.     The language barrier and challenge of translation makes a formal mental status exam difficult. However this patient maintains good eye contact, he responds properly with fluent language when a question is asked and the translator's remarks are  congruent and appropriate for the topic. It appears that the one strength he has in the face of his depression are the 2 roommates. One as an American can presumably help him navigate through many challenges he has in this acculturation to living in the [place]. His had to deal with his seizure disorder he says that he has one to 2 seizures per month. He did not have any seizures last month. His seizures are the only medical problem, besides his leg pain, mentioned during this discussion.  RECOMMENDATION:  1.  Pt has passive suicidal ideation; suggest sitter in room. 2.  Pt is capable of providing detailed information about his life situation but it is not understood how he relates to his roommates and manages daily routine.  Collateral information from roommates would be informative; suggest asking permition to speak with [name]. 3.  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 4.  Suggest Lexapro, escitalopram 20 mg after breakfast for depression/anxiety or: Cymbalta 30 mg for depression, anxiety and neuropathic pain  in am  EKG appears to allow this; reconcile with other medications.  5.  Depending upon diagnosis and treatment, pt may need further physical therapy i.e. SNF after discharge .  Will follow pt. [name], MD Psychiatrist  12/21/2011 3:15 PM

## 2011-12-21 NOTE — Progress Notes (Signed)
Bilateral:  No evidence of DVT, superficial thrombosis, or Baker's Cyst.   

## 2011-12-21 NOTE — Progress Notes (Signed)
Physical Therapy Treatment Patient Details Name: [name] MRN: [id number] DOB: [birth year] Today's Date: 12/21/2011 Time: 4098-1191 PT Time Calculation (min): 12 min  PT Assessment / Plan / Recommendation Comments on Treatment Session  Patient admitted with seizure, able to ambulate on Sunday however today experiencing increased pain in BLEs and unable to stand without +2 total A. Patient unable to walk today and states that this pain started yesterday. Patient had interpreter present and I was able to work in between meeting with psychologist and Dr. Waymon Amato.     Follow Up Recommendations  Home health PT;Post acute inpatient (depending progress)     Does the patient have the potential to tolerate intense rehabilitation  No, Recommend SNF  Barriers to Discharge        Equipment Recommendations  Rolling walker with 5" wheels;Tub/shower seat    Recommendations for Other Services    Frequency Min 3X/week   Plan Discharge plan needs to be updated;Frequency remains appropriate    Precautions / Restrictions Precautions Precautions: Fall   Pertinent Vitals/Pain Patient complained of new bilateral leg pain, unable to walk this session    Mobility  Bed Mobility Bed Mobility: Sitting - Scoot to Edge of Bed Supine to Sit: 4: Min assist Sitting - Scoot to Edge of Bed: 4: Min guard Details for Bed Mobility Assistance: Cues for safety and technique. A for LEs out of the bed Transfers Transfers: Stand Pivot Transfers Sit to Stand: 1: +2 Total assist Sit to Stand: Patient Percentage: 60% Stand to Sit: 1: +2 Total assist Stand to Sit: Patient Percentage: 60% Stand Pivot Transfers: 1: +2 Total assist Details for Transfer Assistance: Patient with increased difficulty standing this session. Patient screaming out with pain and unable to fully stand upright. first attempt at stand was unsucessful and patient sat back onto bed. On second attempt patient able to clear buttocks off of bed but  required A for balance and he was unable to fully extend knees. A at hips to facilitate positioning to recliner.  Ambulation/Gait Ambulation/Gait Assistance: Not tested (comment)    Exercises     PT Diagnosis:    PT Problem List:   PT Treatment Interventions:     PT Goals Acute Rehab PT Goals PT Goal: Sit to Stand - Progress: Not progressing PT Goal: Stand to Sit - Progress: Not progressing PT Goal: Ambulate - Progress: Not progressing  Visit Information  Last PT Received On: 12/21/11 Assistance Needed: +2    Subjective Data  Subjective: " my feet pain"   Cognition  Overall Cognitive Status: Impaired Area of Impairment: Safety/judgement;Awareness of deficits Difficult to assess due to: Non-English speaking Arousal/Alertness: Awake/alert Orientation Level: Appears intact for tasks assessed Behavior During Session: [facility] for tasks performed Safety/Judgement: Decreased awareness of need for assistance;Decreased safety judgement for tasks assessed Awareness of Deficits: Patient stating he is unable to stand but repeativly ask to walk to the restroom.     Balance     End of Session PT - End of Session Equipment Utilized During Treatment: Gait belt Activity Tolerance: Patient limited by pain Patient left: in chair;with call bell/phone within reach;with nursing in room Nurse Communication: Mobility status   GP     [name] 12/21/2011, 2:34 PM  12/21/2011 [name] PTA [phone] pager [phone] office

## 2011-12-21 NOTE — Progress Notes (Signed)
[facility] Progress Note [place] TEAM 1 - Stepdown/ICU BRITTAN MAPEL Ursin NWG:[id number] DOB: 29-Mar-1960 DOA: 12/17/2011 PCP: No primary provider on file.  Brief narrative: 51 y.o. male from [place] brought in by EMS. Per ER record he was found on the floor by roommates. He has a history of seizures. He was given versed by EMS in route to the hospital.  There is mention of a TBI in the ER records as well but no family could be reached to confirm this and patient was still too confused. Per ER record, patient follows with Dr. Pearlean Brownie. ER nurse reported that a new medication was started recently but which one is unknown.  In ER was given no medications, CT scan done with diffuse cortical atrophy, no other major lab abnormalities   Assessment/Plan:  Bilateral lower extremity pains Patient indicates that he has had these pains since admission. Gives history of falling with left leg going into a ? hole. Describes generalized pains and not limited only to joints. Denies swellings. Denies history of gout. Will get bilateral lower extremity venous Dopplers and check serum uric acid levels. Tylenol when necessary for pain and monitor.  Seizure - Breakthrough generalized seizure activity  CT scan of his head showed no acute intracranial abnormality - was on keppra, dilantin and vimpat - Dilantin toxic and has been held. Neurology following. Continue Vimpat and Keppra. Resumed Dilantin levels at reduced dose once Dilantin levels were normal.  Dilantin toxicity Dilantin held. Monitor Dilantin levels and resume when levels are normal. Serum Dilantin level 22. Continue to hold Dilantin  Altered mental status/Encephalopathy Due to postictal state And Dilantin toxicity. the Per nurses discussion with patient's family 10/19, patient's mental status at baseline. Patient expresses "I don't want to live anymore" 10/19 which he has done for many years but has no plans to kill himself and family does  not think that he is at risk to harm himself. Sitter was placed by nursing for safety. Psychiatry consulted-input pending. Patient is alert. Mental status changes have resolved.  Possible suicidal ideations and depression Psychiatry consulted-note pending. Dr. Ferol Luz advises to continue safety sitter and will reevaluate patient tomorrow.  Elevated BP No known hx of HTN - likely related to acute illness - follow trend w/o scheduled tx at this time   Normocytic anemia Was not anemic at admit - likely due to hemodilution w/ IVF - also may be a component of chronic low grade anemia - will check Fe panel and guiac stool - review of old records suggests baseline Hgb of ~13. Hemoglobin normal  Mild hypokalemia Repleted.  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 Left message to update patient's caregiver/friend Ms. Kae Heller (patient had consented) Disposition Plan: Home  Consultants: Neuro  Psychiatry  Procedures: none  Antibiotics: none  DVT prophylaxis: SCDs  HPI/Subjective: Discussed with patient using human interpreter. Patient complains of bilateral lower extremity pains waist down. He denies swelling. No specific joint pains. Gives history of falling with left lower extremity landing in a large hole-prior to admission. Denies history of gout. Denies dyspnea or chest pain. Difficulties standing due to pains in the legs.   Objective: Blood pressure 137/87, pulse 98, temperature 98 F (36.7 C), temperature source Oral, resp. rate 20, height 5\' 5"  (1.651 m), weight 78.6 kg (173 lb 4.5 oz), SpO2 99.00%.  Intake/Output Summary (Last 24 hours) at  12/21/11 1715 Last data filed at 12/20/11 2134  Gross per 24 hour  Intake      3 ml  Output      0 ml  Net      3 ml     Exam: General: No acute respiratory distress. Sitting up in chair. Lungs: Clear to  auscultation bilaterally without wheezes or crackles Cardiovascular: Regular rate and rhythm without murmur gallop or rub  Abdomen: Nontender, nondistended, soft, bowel sounds positive, no rebound, no ascites, no appreciable mass Extremities: No significant cyanosis, clubbing, or edema bilateral lower extremities. No asymmetrical swelling or acute findings. Peripheral pulses symmetrically felt. Central nervous system: Alert and oriented. No focal neurological deficits.  Data Reviewed: Basic Metabolic Panel:  Lab 12/18/11 1610 12/17/11 1112  NA 139 140  K 3.8 3.3*  CL 100 102  CO2 22 28  GLUCOSE 100* 95  BUN 12 9  CREATININE 0.93 0.85  CALCIUM 9.0 9.0  MG -- --  PHOS -- --   Liver Function Tests:  Lab 12/20/11 0850 12/18/11 0440 12/17/11 1112  AST -- 21 22  ALT -- 12 14  ALKPHOS -- 78 88  BILITOT -- 0.2* 0.2*  PROT -- 7.8 8.7*  ALBUMIN 3.2* 3.4* 3.8   CBC:  Lab 12/19/11 0610 12/18/11 0440 12/17/11 1112  WBC 9.2 6.8 6.7  NEUTROABS -- -- 4.6  HGB 14.5 11.5* 14.9  HCT 41.8 43.2 42.7  MCV 91.1 90.8 90.7  PLT 387 271 364   Cardiac Enzymes:  Lab 12/17/11 1811  CKTOTAL 251*  CKMB --  CKMBINDEX --  TROPONINI --   CBG:  Lab 12/17/11 1853 12/17/11 1102  GLUCAP 116* 88    Recent Results (from the past 240 hour(s))  MRSA PCR SCREENING     Status: Normal   Collection Time   12/17/11  5:59 PM      Component Value Range Status Comment   MRSA by PCR NEGATIVE  NEGATIVE Final      Studies:  Recent x-ray studies have been reviewed in detail by the Attending Physician  Scheduled Meds:  Reviewed in detail by the Attending Physician   [facility] Office  [phone] Pager [phone]  On-Call/Text Page:      [email or web address]      password [email or web address]  If 7PM-7AM, please contact night-coverage [email or web address] Password [email or web address] 12/21/2011, 5:15 PM   LOS: 4 days

## 2011-12-22 DIAGNOSIS — R45851 Suicidal ideations: Secondary | ICD-10-CM

## 2011-12-22 DIAGNOSIS — F329 Major depressive disorder, single episode, unspecified: Secondary | ICD-10-CM

## 2011-12-22 LAB — PHENYTOIN LEVEL, TOTAL: Phenytoin Lvl: 11.1 ug/mL (ref 10.0–20.0)

## 2011-12-22 MED ORDER — PHENYTOIN SODIUM EXTENDED 100 MG PO CAPS
200.0000 mg | ORAL_CAPSULE | Freq: Every day | ORAL | Status: DC
  Administered 2011-12-22 – 2011-12-23 (×2): 200 mg via ORAL
  Filled 2011-12-22 (×2): qty 2

## 2011-12-22 MED ORDER — PHENYTOIN SODIUM EXTENDED 30 MG PO CAPS
150.0000 mg | ORAL_CAPSULE | Freq: Every day | ORAL | Status: DC
  Administered 2011-12-22: 150 mg via ORAL
  Filled 2011-12-22 (×2): qty 5

## 2011-12-22 MED ORDER — DULOXETINE HCL 30 MG PO CPEP
30.0000 mg | ORAL_CAPSULE | Freq: Every day | ORAL | Status: DC
  Administered 2011-12-22 – 2011-12-23 (×2): 30 mg via ORAL
  Filled 2011-12-22 (×2): qty 1

## 2011-12-22 NOTE — Progress Notes (Signed)
Writer met with patient, interpreter, caregiver, Ala Dach, and another roommate along with Dr Cordelia Pen at 11:30 AM today.  Patient was in bedside chair in recline position and communicating through interpreter. Patient's affect was flat and somewhat depressed. Patient reports no alcohol or drug and no safety concerns, desires to return home upon discharge. Patient reports he lives with the two visitors in room (other than interpreter) and is able to manage well with their help.They apparently share expenses and meals with one another. Patient currently has neurologist for seizure disorder but no primary care physician.  Caregiver has attempted to obtain a PCP for patient but runs into difficulty due to medicare/medicaid status.  Dr Cordelia Pen discussed  Medication recommendations to patient and care giver and stated she would make recommendations to patient's physician.    Both patient, through interpreter, and caregiver stated they had no further concerns at this time.   Carney Bern, LCSWA 12/22/2011 4:15 PM

## 2011-12-22 NOTE — Progress Notes (Addendum)
Physical Therapy Treatment Patient Details Name: [name] MRN: [id number] DOB: [birth year] Today's Date: 12/22/2011 Time: 7829-5621 PT Time Calculation (min): 30 min  PT Assessment / Plan / Recommendation Comments on Treatment Session  Patient able to ambulate today. Still complaining of pain in LEs. Dr. Waymon Amato able to watch patient ambulate and spoke with me about having conference with interpreter, MD, CSW later when they can organize. RN plans to notify me when this is happening. Patient will need 24/7 assistance at home for safety and mobility,. If not availible will have to consider SNF options.     Follow Up Recommendations  Post acute inpatient;Supervision for mobility/OOB;Home health PT     Does the patient have the potential to tolerate intense rehabilitation  No, Recommend SNF  Barriers to Discharge        Equipment Recommendations  Rolling walker with 5" wheels;Tub/shower seat    Recommendations for Other Services    Frequency     Plan Discharge plan needs to be updated;Frequency remains appropriate    Precautions / Restrictions Precautions Precautions: Fall   Pertinent Vitals/Pain     Mobility  Bed Mobility Supine to Sit: 6: Modified independent (Device/Increase time) Sitting - Scoot to Edge of Bed: 6: Modified independent (Device/Increase time) Transfers Sit to Stand: 4: Min assist;With upper extremity assist;With armrests;From chair/3-in-1;From bed Stand to Sit: 4: Min assist;With upper extremity assist;With armrests;To chair/3-in-1 Details for Transfer Assistance: Tactile cues for safe hand placement. Min A to ensure balance and support with stand. Increased time to stand upright Ambulation/Gait Ambulation/Gait Assistance: 4: Min assist Ambulation Distance (Feet): 120 Feet Assistive device: Rolling walker Ambulation/Gait Assistance Details: Patient with heavy reliance on UEs with ambulation. Patient requiring A for balance at times but gait appears  increasily steady as ambulation progressed. Verbal, tactile, manual cues for safety with RW. +1 to follow with chair as patient suddenly sits for rest break.  Gait velocity: decreased    Exercises     PT Diagnosis:    PT Problem List:   PT Treatment Interventions:     PT Goals Acute Rehab PT Goals PT Goal: Sit to Stand - Progress: Progressing toward goal PT Goal: Stand to Sit - Progress: Progressing toward goal PT Goal: Ambulate - Progress: Progressing toward goal  Visit Information  Last PT Received On: 12/22/11 Assistance Needed: +2    Subjective Data      Cognition  Overall Cognitive Status: Impaired Area of Impairment: Safety/judgement;Following commands Difficult to assess due to: Impaired communication;Non-English speaking (can understand some basic english) Arousal/Alertness: Awake/alert Orientation Level: Appears intact for tasks assessed Behavior During Session: [facility] for tasks performed Safety/Judgement: Decreased awareness of safety precautions;Impulsive;Decreased safety judgement for tasks assessed    Balance     End of Session PT - End of Session Equipment Utilized During Treatment: Gait belt Activity Tolerance: Patient limited by pain;Patient tolerated treatment well Patient left: in chair;with call bell/phone within reach;with chair alarm set;with nursing in room Nurse Communication: Mobility status   GP     [name] 12/22/2011, 9:17 AM 12/22/2011 [name] PTA [phone] pager [phone] office

## 2011-12-22 NOTE — Progress Notes (Signed)
I have read and agree with the below note.  Nyeshia Mysliwiec Helen Whitlow PT, DPT Pager: [phone] 

## 2011-12-22 NOTE — Progress Notes (Signed)
[facility] Progress Note [place] TEAM 1 - Stepdown/ICU [name] Stowell AVW:[id number] DOB: 27-Jan-1961 DOA: 12/17/2011 PCP: No primary provider on file.  Brief narrative: 51 y.o. male from [place] brought in by EMS. Per ER record he was found on the floor by roommates. He has a history of seizures. He was given versed by EMS in route to the hospital.  There is mention of a TBI in the ER records as well but no family could be reached to confirm this and patient was still too confused. Per ER record, patient follows with Dr. Pearlean Brownie. ER nurse reported that a new medication was started recently but which one is unknown.  In ER was given no medications, CT scan done with diffuse cortical atrophy, no other major lab abnormalities   Assessment/Plan:  Bilateral lower extremity pains Patient indicates that he has had these pains since admission. Per family- no recent falls. Describes generalized pains and not limited only to joints. Per family, does have history of gout. Uric acid normal. Pain is improved with Tylenol. Able to ambulate with walker. Continue when necessary Tylenol. If lower extremity pains persist, consider outpatient evaluation by his primary neurologist for neuropathy and consider Neurontin.  Seizure - Breakthrough generalized seizure activity  CT scan of his head showed no acute intracranial abnormality - was on keppra, dilantin and vimpat - Dilantin toxic and has been held. Neurology following. Continue Vimpat and Keppra. Resume Dilantin levels at reduced dose-Dilantin level 11  Dilantin toxicity Dilantin held. Monitor Dilantin levels and resume when levels are normal. Serum Dilantin level 11. Resume Dilantin  Altered mental status/Encephalopathy Due to postictal state And Dilantin toxicity. the Per nurses discussion with patient's family 10/19, patient's mental status at baseline. Patient expresses "I don't want to live anymore" 10/19 which he has done for many  years but has no plans to kill himself and family does not think that he is at risk to harm himself. Sitter was placed by nursing for safety. Psychiatry consulted-input pending. Patient is alert. Mental status changes have resolved.  Possible suicidal ideations and depression Discussed with Dr. Ferol Luz, psychiatry recommended DC safety sitter. She also recommended Cymbalta for depression  Elevated BP No known hx of HTN - likely related to acute illness - follow trend w/o scheduled tx at this time   Normocytic anemia Was not anemic at admit - likely due to hemodilution w/ IVF - also may be a component of chronic low grade anemia - will check Fe panel and guiac stool - review of old records suggests baseline Hgb of ~13. Hemoglobin normal  Mild hypokalemia Repleted.  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 and another male family member.  Disposition Plan: Home 10/23  Consultants: Neuro  Psychiatry  Procedures: none  Antibiotics: none  DVT prophylaxis: SCDs  HPI/Subjective: Improved leg pains.   Objective: Blood pressure 143/79, pulse 84, temperature 98.1 F (36.7 C), temperature source Oral, resp. rate 16, height 5\' 5"  (1.651 m), weight 78.6 kg (173 lb 4.5 oz), SpO2 100.00%.  Intake/Output Summary (Last 24 hours) at 12/22/11 1848 Last data filed at 12/22/11 1718  Gross per 24 hour  Intake    600 ml  Output    675 ml  Net    -75 ml     Exam: General: No acute respiratory distress. Sitting up in chair. Lungs: Clear to auscultation bilaterally  without wheezes or crackles Cardiovascular: Regular rate and rhythm without murmur gallop or rub  Abdomen: Nontender, nondistended, soft, bowel sounds positive, no rebound, no ascites, no appreciable mass Extremities: No significant cyanosis, clubbing, or edema bilateral lower extremities. No  asymmetrical swelling or acute findings. Peripheral pulses symmetrically felt. Ambulating with walker. Central nervous system: Alert and oriented. No focal neurological deficits.  Data Reviewed: Basic Metabolic Panel:  Lab 12/18/11 1610 12/17/11 1112  NA 139 140  K 3.8 3.3*  CL 100 102  CO2 22 28  GLUCOSE 100* 95  BUN 12 9  CREATININE 0.93 0.85  CALCIUM 9.0 9.0  MG -- --  PHOS -- --   Liver Function Tests:  Lab 12/20/11 0850 12/18/11 0440 12/17/11 1112  AST -- 21 22  ALT -- 12 14  ALKPHOS -- 78 88  BILITOT -- 0.2* 0.2*  PROT -- 7.8 8.7*  ALBUMIN 3.2* 3.4* 3.8   CBC:  Lab 12/19/11 0610 12/18/11 0440 12/17/11 1112  WBC 9.2 6.8 6.7  NEUTROABS -- -- 4.6  HGB 14.5 11.5* 14.9  HCT 41.8 43.2 42.7  MCV 91.1 90.8 90.7  PLT 387 271 364   Cardiac Enzymes:  Lab 12/17/11 1811  CKTOTAL 251*  CKMB --  CKMBINDEX --  TROPONINI --   CBG:  Lab 12/17/11 1853 12/17/11 1102  GLUCAP 116* 88    Recent Results (from the past 240 hour(s))  MRSA PCR SCREENING     Status: Normal   Collection Time   12/17/11  5:59 PM      Component Value Range Status Comment   MRSA by PCR NEGATIVE  NEGATIVE Final      Studies:  Recent x-ray studies have been reviewed in detail by the Attending Physician  Scheduled Meds:  Reviewed in detail by the Attending Physician   [facility] Office  [phone] Pager [phone]  On-Call/Text Page:      [email or web address]      password [email or web address]  If 7PM-7AM, please contact night-coverage [email or web address] Password [facility] 12/22/2011, 6:48 PM   LOS: 5 days

## 2011-12-22 NOTE — Progress Notes (Signed)
Physical Therapy Treatment Patient Details Name: [name] MRN: [id number] DOB: May 27, 1960 Today's Date: 12/22/2011 Time: 7829-5621 PT Time Calculation (min): 17 min  PT Assessment / Plan / Recommendation Comments on Treatment Session  Self Care/Home Management meeting with case manager, MD, and family (who interprets). Family and patient were educated on therapy sessions and how patient was progressing and what he would require at home. Patients " brother" [name] was the interpreter and patients caregiver at home. He states that someone is with patient at all times and that he is the one that takes him to his appointments. [name] stated that the patient has a walker at home however does not have a tub seat/bench. Patient does get in the shower at home. Patient is not safe to stand without RW and his family was made aware of this. [name] is planning on getting anf OT consult to assess needs at home. Will follow up tomorrow with further issues/questions.     Follow Up Recommendations  Home health PT;Supervision/Assistance - 24 hour     Does the patient have the potential to tolerate intense rehabilitation  No, Recommend SNF  Barriers to Discharge        Equipment Recommendations  Tub/shower seat    Recommendations for Other Services OT consult  Frequency Min 3X/week   Plan Discharge plan remains appropriate;Frequency remains appropriate    Precautions / Restrictions Precautions Precautions: Fall Restrictions Weight Bearing Restrictions: No   Pertinent Vitals/Pain        Exercises     PT Diagnosis:    PT Problem List:   PT Treatment Interventions:     PT Goals Acute Rehab PT Goals PT Goal: Sit to Stand - Progress: Progressing toward goal PT Goal: Stand to Sit - Progress: Progressing toward goal PT Goal: Ambulate - Progress: Progressing toward goal  Visit Information  Last PT Received On: 12/22/11 Assistance Needed: +2    Subjective Data        Cognition  Overall Cognitive Status: Impaired Area of Impairment: Safety/judgement;Following commands Difficult to assess due to: Impaired [name];Non-English speaking (can understand some basic english) Arousal/Alertness: Awake/alert Orientation Level: Appears intact for tasks assessed Behavior During Session: [facility] for tasks performed Safety/Judgement: Decreased awareness of safety precautions;Impulsive;Decreased safety judgement for tasks assessed    Balance     End of Session PT - End of Session Equipment Utilized During Treatment: Gait belt Activity Tolerance: Patient limited by pain;Patient tolerated treatment well Patient left: in bed;with call bell/phone within reach;with nursing in room;with family/visitor present [name]: Mobility status   GP     [name] 12/22/2011, 10:45 AM  12/22/2011 [name] PTA [phone] pager [phone] office

## 2011-12-22 NOTE — Progress Notes (Signed)
[facility]  Pt said he has DME at home already.

## 2011-12-23 MED ORDER — DULOXETINE HCL 30 MG PO CPEP: 30.0000 mg | ORAL_CAPSULE | Freq: Every day | ORAL | Status: DC

## 2011-12-23 MED ORDER — LACOSAMIDE 50 MG PO TABS: 150.0000 mg | ORAL_TABLET | Freq: Two times a day (BID) | ORAL | Status: DC

## 2011-12-23 MED ORDER — PHENYTOIN SODIUM EXTENDED 100 MG PO CAPS: 200.0000 mg | ORAL_CAPSULE | Freq: Every morning | ORAL | Status: DC

## 2011-12-23 MED ORDER — PHENYTOIN SODIUM EXTENDED 30 MG PO CAPS: 150.0000 mg | ORAL_CAPSULE | Freq: Every day | ORAL | Status: DC

## 2011-12-23 MED ORDER — ACETAMINOPHEN 325 MG PO TABS: 650.0000 mg | ORAL_TABLET | Freq: Four times a day (QID) | ORAL | Status: DC | PRN

## 2011-12-23 NOTE — Evaluation (Signed)
Occupational Therapy Evaluation Patient Details Name: [name] MRN: [id number] DOB: 02-22-1961 Today's Date: 12/23/2011 Time: 0820-0850 OT Time Calculation (min): 30 min  OT Assessment / Plan / Recommendation Clinical Impression  Pleasant 51 yr old male admitted with history of seizures and new onset seizures as well.  Now with slight decreased safety awareness and decreased dynamic balance for functional selfcare tasks.  Feel he will benefit from acute OT to help increase safety.  Also feel he will need a small tub/shower seat at discharge.  Anticipate no HHOT needs.    OT Assessment  Patient needs continued OT Services    Follow Up Recommendations  No OT follow up       Equipment Recommendations  Tub/shower seat (Simultaneous filing. User may not have seen previous data.)       Frequency  Min 2X/week    Precautions / Restrictions Precautions Precautions: Fall Restrictions Weight Bearing Restrictions: No   Pertinent Vitals/Pain 1/10 in his legs, no need for medication at this time.    ADL  Eating/Feeding: Performed;Independent Where Assessed - Eating/Feeding: Edge of bed Grooming: Performed;Min guard Where Assessed - Grooming: Unsupported standing Upper Body Bathing: Performed;Supervision/safety Where Assessed - Upper Body Bathing: Unsupported sitting Lower Body Bathing: Performed;Min guard Where Assessed - Lower Body Bathing: Supported sit to stand Upper Body Dressing: Simulated;Min guard Where Assessed - Upper Body Dressing: Unsupported standing Lower Body Dressing: Performed;Min guard Where Assessed - Lower Body Dressing: Unsupported sit to stand Toilet Transfer: Simulated;Min guard Toilet Transfer Method: Other (comment) (Ambulate with RW to EOB, pt declined need to toilet) Toilet Transfer Equipment: Comfort height toilet Toileting - Clothing Manipulation and Hygiene: Simulated;Min guard Where Assessed - Toileting Clothing Manipulation and Hygiene: Sit to stand  from 3-in-1 or toilet Tub/Shower Transfer: Performed;Min guard;Other (comment) (step over the edge of the tub) Tub/Shower Transfer Method: Ambulating Equipment Used: Rolling walker;Gait belt Transfers/Ambulation Related to ADLs: Pt overall min guard assist for mobility using the RW in the room and out in the hall. ADL Comments: Pt still with slight static and dynamic balance issues related to selfcare.  Feel he will benefit from a shower seat for safety at home because of this.  Required mod instructional cueing to apply more shaving cream before shaving.    OT Diagnosis: Generalized weakness;Cognitive deficits  OT Problem List: Impaired balance (sitting and/or standing);Decreased knowledge of use of DME or AE;Decreased safety awareness OT Treatment Interventions: Self-care/ADL training;DME and/or AE instruction;Balance training;Cognitive remediation/compensation   OT Goals Acute Rehab OT Goals OT Goal Formulation: With patient Time For Goal Achievement: 01/06/12 Potential to Achieve Goals: Good ADL Goals Pt Will Perform Grooming: with modified independence;Standing at sink ADL Goal: Grooming - Progress: Goal set today Pt Will Perform Upper Body Bathing: with modified independence;Sit to stand from bed ADL Goal: Upper Body Bathing - Progress: Goal set today Pt Will Perform Lower Body Bathing: with modified independence;Sit to stand from bed ADL Goal: Lower Body Bathing - Progress: Goal set today Pt Will Perform Upper Body Dressing: with modified independence;Sitting, bed;Unsupported ADL Goal: Upper Body Dressing - Progress: Goal set today Pt Will Perform Lower Body Dressing: with modified independence;Sit to stand from bed;Unsupported ADL Goal: Lower Body Dressing - Progress: Goal set today Pt Will Transfer to Toilet: with modified independence;with DME;Regular height toilet ADL Goal: Toilet Transfer - Progress: Goal set today  Visit Information  Last OT Received On:  12/23/11 Assistance Needed: +1 PT/OT Co-Evaluation/Treatment: Yes    Subjective Data  Subjective: "Pain"  referring to  his legs. Patient Stated Goal: Did not state.   Prior Functioning     Home Living Lives With: Friend(s) (brother) Available Help at Discharge: Friend(s);Available 24 hours/day Type of Home: Apartment Home Access: Stairs to enter Entrance Stairs-Number of Steps: 3 Entrance Stairs-Rails: Right;Left Home Layout: One level Bathroom Shower/Tub: Engineer, manufacturing systems: Standard Home Adaptive Equipment: None Additional Comments: Information obtained from [employer] - contact person. Prior Function Level of Independence: Independent Able to Take Stairs?: Yes Driving: No Vocation: On disability Communication Communication: Prefers language other than English Dominant Hand: Right         Vision/Perception Vision - Assessment Vision Assessment: Vision not tested Perception Perception: Within Functional Limits Praxis Praxis: Intact   Cognition  Overall Cognitive Status: Impaired Area of Impairment: Safety/judgement Difficult to assess due to: Non-English speaking Arousal/Alertness: Awake/alert Orientation Level: Appears intact for tasks assessed Behavior During Session: [facility] for tasks performed Following Commands: Follows one step commands consistently;Follows multi-step commands consistently    Extremity/Trunk Assessment Right Upper Extremity Assessment RUE ROM/Strength/Tone: Within functional levels RUE Sensation: WFL - Light Touch RUE Coordination: WFL - gross/fine motor Left Upper Extremity Assessment LUE ROM/Strength/Tone: Within functional levels LUE Sensation: WFL - Light Touch LUE Coordination: WFL - gross/fine motor Trunk Assessment Trunk Assessment: Normal     Mobility Bed Mobility Bed Mobility: Not assessed Transfers Transfers: Sit to Stand Sit to Stand: 5: Supervision;Without upper extremity assist Stand to Sit: 5:  Supervision;Without upper extremity assist           Balance Balance Balance Assessed: Yes Static Standing Balance Static Standing - Balance Support: No upper extremity supported Static Standing - Level of Assistance: 5: Stand by assistance Dynamic Standing Balance Dynamic Standing - Balance Support: Bilateral upper extremity supported Dynamic Standing - Level of Assistance: 4: Min assist;Other (comment) (Min guard with mobility using the RW.) Dynamic Standing - Balance Activities: Lateral lean/weight shifting;Forward lean/weight shifting;Reaching across midline Dynamic Standing - Comments: 10 mins.    End of Session OT - End of Session Equipment Utilized During Treatment: Gait belt Activity Tolerance: Patient tolerated treatment well Patient left: in bed;with bed alarm set Nurse Communication: Mobility status     Madiha Bambrick OTR/L Pager number [phone] 12/23/2011, 9:02 AM

## 2011-12-23 NOTE — Discharge Summary (Signed)
Physician Discharge Summary  Patient ID: [name] MRN: [id number] DOB/AGE: 1960-06-14 51 y.o.  Admit date: 12/17/2011 Discharge date: 12/23/2011  Primary Care Physician:  Dr. Concepcion Elk  Neurologist: [name] Neurology  Discharge Follow up: [name] in 2 weeks, consider starting Neurontin or Lyrica for neuropathic pain upon Follow up   Discharge Diagnoses:   Dilantin toxicity Breakthrough Seizures Altered mental state HTN (hypertension) Encephalopathy Lower extremity pain Depression      Medication List     As of 12/23/2011  1:45 PM    TAKE these medications         acetaminophen 325 MG tablet   Commonly known as: TYLENOL   Take 2 tablets (650 mg total) by mouth every 6 (six) hours as needed for pain.      DULoxetine 30 MG capsule   Commonly known as: CYMBALTA   Take 1 capsule (30 mg total) by mouth daily.      lacosamide 50 MG Tabs   Commonly known as: VIMPAT   Take 3 tablets (150 mg total) by mouth 2 (two) times daily.      levETIRAcetam 1000 MG tablet   Commonly known as: KEPPRA   Take 1,500 mg by mouth 2 (two) times daily.      phenytoin 100 MG ER capsule   Commonly known as: DILANTIN   Take 2 capsules (200 mg total) by mouth every morning.      phenytoin 30 MG ER capsule   Commonly known as: DILANTIN   Take 5 capsules (150 mg total) by mouth daily at 10 pm.        Consults: [name], Neurology  Significant Diagnostic Studies:   CT head:  IMPRESSION: Mild diffuse cortical atrophy. Chronic ischemic white matter disease. No acute intracranial abnormality seen.    Brief H and P: [name] is a 51 y.o. male who was brought in by EMS. Per ER record he was found on the floor by roommates with breakthrough seizures, he has a history of seizures. He was given versed by EMS in route to the hospital. He will open his eyes and answer a few questions now. He is also following commands. There is mention of a TBI in the ER records as well but no  family could be reached to confirm this and patient still too confused. Per ER record, patient follows with [name].  In ER was given no medications, CT scan done with diffuse cortical atrophy, no other major lab abnormalities  Unable to get history from patient, so obtained from EMS/ER record    Hospital Course:  Bilateral lower extremity pains  Patient indicates that he has had these pains since admission. Per family- no recent falls. Describes generalized pains and not limited only to joints. Per family, does have history of gout. Uric acid normal. Pain is improved with Tylenol. Able to ambulate with walker. Continue when necessary Tylenol. If lower extremity pains persist, consider outpatient evaluation by his primary neurologist for neuropathy and consider Neurontin.   Seizure - Breakthrough generalized seizure activity  CT scan of his head showed no acute intracranial abnormality - was on keppra, dilantin and vimpat - Dilantin toxic and hence this was held then resumed at a lower dose, seen and followed by  Neurology. Continued Keppra at home dose and Vimpat dose increased to 150mg  BID from 100mg  BID.   Dilantin toxicity  Dilantin held initially and then resumedn   Altered mental status/Encephalopathy  Due to postictal state And Dilantin toxicity.  Per  discussion with patient's family 10/19, patient's mental status at baseline. [name]   Possible suicidal ideations and depression  Patient expressed "I don't want to live anymore" on 10/19 which he has done for many years but has no plans to kill himself and family does not think that he is at risk to harm himself. Sitter was placed by nursing for safety. Psychiatry consulted and seen by [name], felt to have adequate support system at home, and started on Cymbalta per Psychiatry recommendation and inpatient psychiatry was not recommended  Mild hypokalemia  Repleted.   Time spent on Discharge:  Signed: Taila [name] [facility]  12/23/2011, 1:45 PM

## 2011-12-23 NOTE — Progress Notes (Signed)
Physical Therapy Treatment Patient Details Name: [name] MRN: [id number] DOB: February 10, 1961 Today's Date: 12/23/2011 Time: 0820-0850 PT Time Calculation (min): 30 min  PT Assessment / Plan / Recommendation Comments on Treatment Session  Patient progressing very well this session with increased safety awareness. Patient able to perfrom long hall ambulation, stairs, and balance with functional activity. Per caregiver he has supervision at home throughout the day. Per discussion with MD yesterday, plan for DC today with HHPT.     Follow Up Recommendations  Home health PT;Supervision for mobility/OOB;Supervision/Assistance - 24 hour     Does the patient have the potential to tolerate intense rehabilitation     Barriers to Discharge        Equipment Recommendations  Tub/shower seat    Recommendations for Other Services    Frequency Min 3X/week   Plan Discharge plan remains appropriate;Frequency remains appropriate    Precautions / Restrictions Precautions Precautions: Fall Restrictions Weight Bearing Restrictions: No   Pertinent Vitals/Pain     Mobility  Bed Mobility Bed Mobility: Not assessed Transfers Sit to Stand: 5: Supervision;Without upper extremity assist Stand to Sit: 5: Supervision;Without upper extremity assist Ambulation/Gait Ambulation/Gait Assistance: 4: Min guard Ambulation Distance (Feet): 500 Feet Assistive device: Rolling walker Ambulation/Gait Assistance Details: Patient with less reliance on UEs this session. Progressing well with ambulation. Some antalgic gait with LLE.  Gait Pattern: Step-through pattern;Decreased stride length Stairs: Yes Stairs Assistance: 4: Min guard Stair Management Technique: Alternating pattern;Forwards;Two rails Number of Stairs: 5     Exercises     PT Diagnosis:    PT Problem List:   PT Treatment Interventions:     PT Goals Acute Rehab PT Goals PT Goal: Sit to Stand - Progress: Progressing toward goal PT Goal: Stand  to Sit - Progress: Progressing toward goal PT Goal: Ambulate - Progress: Progressing toward goal PT Goal: Up/Down Stairs - Progress: Progressing toward goal  Visit Information  Last PT Received On: 12/23/11 Assistance Needed: +1 PT/OT Co-Evaluation/Treatment: Yes    Subjective Data      Cognition  Overall Cognitive Status: Impaired Area of Impairment: Safety/judgement Difficult to assess due to: Non-English speaking Arousal/Alertness: Awake/alert Orientation Level: Appears intact for tasks assessed Behavior During Session: [facility] for tasks performed Following Commands: Follows one step commands consistently;Follows multi-step commands consistently    Balance  Balance Balance Assessed: Yes Static Standing Balance Static Standing - Balance Support: No upper extremity supported Static Standing - Level of Assistance: 5: Stand by assistance Dynamic Standing Balance Dynamic Standing - Balance Support: Bilateral upper extremity supported Dynamic Standing - Level of Assistance: 4: Min assist;Other (comment) (Min guard with mobility using the RW.) Dynamic Standing - Balance Activities: Lateral lean/weight shifting;Forward lean/weight shifting;Reaching across midline Dynamic Standing - Comments: 10 mins.   End of Session PT - End of Session Equipment Utilized During Treatment: Gait belt Activity Tolerance: Patient tolerated treatment well Patient left: in bed;with bed alarm set;with call bell/phone within reach Nurse Communication: Mobility status   GP     [name] 12/23/2011, 9:00 AM 12/23/2011 [name] PTA [phone] pager [phone] office

## 2011-12-23 NOTE — Progress Notes (Signed)
I have read and agree with the below note.  Jaree Trinka Helen Whitlow PT, DPT Pager: [phone] 

## 2011-12-23 NOTE — Progress Notes (Signed)
Pt is present with his two room mates.  One with same last name, Hambly also from [place] - his Brother and an AAM who has assumed the role of caregiver.  He is the most informed about his behavior and his treatment.  He supervises the medication he takes.  They worked together for many years and now, retired, they are usually at home.  Pt has complained of pain in lower legs.  Friend says when he hurts, he will say he does not want to live.  Today pt says he is not suicidal.  He apppears to be at ease with the interpreter from the agency and his two roommates.  He is alert, aware and oriented to purpose and name and place.  Cultural diversity complicates the more cognitive questions relating to American culture.   He shows some humor as the friends converse.  Otherwise his affect is rather blunt and language [not knowing the phonemes] is gutteral and monotonous in tone. His comprehension appears to be intact because he listens and promptly responds with logical replies to questions.   Medications are discussed as to purpose possible side effect and need to keep in contact with a psychiatrist.   RECOMMENDATION:  1. Dr. Waymon Amato is called to report pt is cognitively intact and has a support system in house to supevise and seek help if needed.  Pt is expecting to return to his apt when medically stable.  2.  Suggest Cymbalta 30 mg for depressed mood and neuropathic pain - if compatible with anti-seizure medications. 3.  No other psychiatric needs unless requested.  .MD psychiatrist signs off [name] J. Damon Luz, MD Psychiatrist  12/23/2011  2:08 AM

## 2011-12-23 NOTE — Progress Notes (Addendum)
Dc instructions provided. Pt caregiver verbalized understanding. Pt under no s/s distress. Iv dc  Intact. Rx given to caregiver.

## 2011-12-26 NOTE — ED Provider Notes (Addendum)
Medical screening examination/treatment/procedure(s) were conducted as a shared visit with non-physician practitioner(s) and myself.  I personally evaluated the patient during the encounter  Ethelda Chick, MD 12/26/11 1504  Ethelda Chick, MD 12/26/11 1504  Ethelda Chick, MD 12/26/11 1504

## 2011-12-31 DIAGNOSIS — Z125 Encounter for screening for malignant neoplasm of prostate: Secondary | ICD-10-CM | POA: Diagnosis not present

## 2011-12-31 DIAGNOSIS — Z131 Encounter for screening for diabetes mellitus: Secondary | ICD-10-CM | POA: Diagnosis not present

## 2011-12-31 DIAGNOSIS — Z13220 Encounter for screening for lipoid disorders: Secondary | ICD-10-CM | POA: Diagnosis not present

## 2011-12-31 DIAGNOSIS — Z136 Encounter for screening for cardiovascular disorders: Secondary | ICD-10-CM | POA: Diagnosis not present

## 2011-12-31 DIAGNOSIS — G40909 Epilepsy, unspecified, not intractable, without status epilepticus: Secondary | ICD-10-CM | POA: Diagnosis not present

## 2011-12-31 DIAGNOSIS — E876 Hypokalemia: Secondary | ICD-10-CM | POA: Diagnosis not present

## 2012-03-07 DIAGNOSIS — G40219 Localization-related (focal) (partial) symptomatic epilepsy and epileptic syndromes with complex partial seizures, intractable, without status epilepticus: Secondary | ICD-10-CM | POA: Diagnosis not present

## 2012-03-07 DIAGNOSIS — Z79899 Other long term (current) drug therapy: Secondary | ICD-10-CM | POA: Diagnosis not present

## 2012-03-07 DIAGNOSIS — G40209 Localization-related (focal) (partial) symptomatic epilepsy and epileptic syndromes with complex partial seizures, not intractable, without status epilepticus: Secondary | ICD-10-CM | POA: Diagnosis not present

## 2012-03-07 DIAGNOSIS — G40319 Generalized idiopathic epilepsy and epileptic syndromes, intractable, without status epilepticus: Secondary | ICD-10-CM | POA: Diagnosis not present

## 2012-03-09 DIAGNOSIS — R030 Elevated blood-pressure reading, without diagnosis of hypertension: Secondary | ICD-10-CM | POA: Diagnosis not present

## 2012-03-09 DIAGNOSIS — E559 Vitamin D deficiency, unspecified: Secondary | ICD-10-CM | POA: Diagnosis not present

## 2012-03-09 DIAGNOSIS — F329 Major depressive disorder, single episode, unspecified: Secondary | ICD-10-CM | POA: Diagnosis not present

## 2012-03-09 DIAGNOSIS — G40909 Epilepsy, unspecified, not intractable, without status epilepticus: Secondary | ICD-10-CM | POA: Diagnosis not present

## 2012-06-07 DIAGNOSIS — F329 Major depressive disorder, single episode, unspecified: Secondary | ICD-10-CM | POA: Diagnosis not present

## 2012-06-07 DIAGNOSIS — I10 Essential (primary) hypertension: Secondary | ICD-10-CM | POA: Diagnosis not present

## 2012-06-07 DIAGNOSIS — G40909 Epilepsy, unspecified, not intractable, without status epilepticus: Secondary | ICD-10-CM | POA: Diagnosis not present

## 2012-06-07 DIAGNOSIS — Z1211 Encounter for screening for malignant neoplasm of colon: Secondary | ICD-10-CM | POA: Diagnosis not present

## 2012-06-15 ENCOUNTER — Ambulatory Visit: Payer: Self-pay | Admitting: Nurse Practitioner

## 2012-06-23 DIAGNOSIS — H5010 Unspecified exotropia: Secondary | ICD-10-CM | POA: Diagnosis not present

## 2012-06-23 DIAGNOSIS — H53029 Refractive amblyopia, unspecified eye: Secondary | ICD-10-CM | POA: Diagnosis not present

## 2012-07-30 ENCOUNTER — Emergency Department (HOSPITAL_COMMUNITY): Payer: Medicare Other

## 2012-07-30 ENCOUNTER — Encounter (HOSPITAL_COMMUNITY): Payer: Self-pay | Admitting: *Deleted

## 2012-07-30 ENCOUNTER — Emergency Department (EMERGENCY_DEPARTMENT_HOSPITAL)
Admission: EM | Admit: 2012-07-30 | Discharge: 2012-08-01 | Disposition: A | Discharge status: Home / Self Care | Payer: Medicare Other | Source: Home / Self Care | Attending: Emergency Medicine | Admitting: Emergency Medicine

## 2012-07-30 DIAGNOSIS — R569 Unspecified convulsions: Secondary | ICD-10-CM

## 2012-07-30 DIAGNOSIS — R6883 Chills (without fever): Secondary | ICD-10-CM | POA: Diagnosis not present

## 2012-07-30 DIAGNOSIS — G40909 Epilepsy, unspecified, not intractable, without status epilepticus: Secondary | ICD-10-CM

## 2012-07-30 DIAGNOSIS — R262 Difficulty in walking, not elsewhere classified: Secondary | ICD-10-CM | POA: Diagnosis not present

## 2012-07-30 DIAGNOSIS — F3289 Other specified depressive episodes: Secondary | ICD-10-CM | POA: Diagnosis not present

## 2012-07-30 DIAGNOSIS — R279 Unspecified lack of coordination: Secondary | ICD-10-CM | POA: Diagnosis not present

## 2012-07-30 DIAGNOSIS — R5381 Other malaise: Secondary | ICD-10-CM | POA: Diagnosis not present

## 2012-07-30 DIAGNOSIS — IMO0002 Reserved for concepts with insufficient information to code with codable children: Secondary | ICD-10-CM | POA: Diagnosis not present

## 2012-07-30 DIAGNOSIS — I10 Essential (primary) hypertension: Secondary | ICD-10-CM

## 2012-07-30 DIAGNOSIS — G9340 Encephalopathy, unspecified: Secondary | ICD-10-CM

## 2012-07-30 DIAGNOSIS — F29 Unspecified psychosis not due to a substance or known physiological condition: Secondary | ICD-10-CM

## 2012-07-30 DIAGNOSIS — G40409 Other generalized epilepsy and epileptic syndromes, not intractable, without status epilepticus: Secondary | ICD-10-CM

## 2012-07-30 DIAGNOSIS — Z87891 Personal history of nicotine dependence: Secondary | ICD-10-CM | POA: Insufficient documentation

## 2012-07-30 DIAGNOSIS — R443 Hallucinations, unspecified: Secondary | ICD-10-CM | POA: Diagnosis not present

## 2012-07-30 DIAGNOSIS — R4182 Altered mental status, unspecified: Secondary | ICD-10-CM | POA: Diagnosis not present

## 2012-07-30 DIAGNOSIS — R7889 Finding of other specified substances, not normally found in blood: Secondary | ICD-10-CM

## 2012-07-30 DIAGNOSIS — Z79899 Other long term (current) drug therapy: Secondary | ICD-10-CM | POA: Insufficient documentation

## 2012-07-30 DIAGNOSIS — R509 Fever, unspecified: Secondary | ICD-10-CM | POA: Diagnosis not present

## 2012-07-30 DIAGNOSIS — I498 Other specified cardiac arrhythmias: Secondary | ICD-10-CM | POA: Diagnosis not present

## 2012-07-30 DIAGNOSIS — F329 Major depressive disorder, single episode, unspecified: Secondary | ICD-10-CM | POA: Diagnosis not present

## 2012-07-30 DIAGNOSIS — F22 Delusional disorders: Secondary | ICD-10-CM | POA: Insufficient documentation

## 2012-07-30 DIAGNOSIS — R892 Abnormal level of other drugs, medicaments and biological substances in specimens from other organs, systems and tissues: Secondary | ICD-10-CM | POA: Insufficient documentation

## 2012-07-30 DIAGNOSIS — R404 Transient alteration of awareness: Secondary | ICD-10-CM | POA: Diagnosis not present

## 2012-07-30 DIAGNOSIS — R269 Unspecified abnormalities of gait and mobility: Secondary | ICD-10-CM | POA: Diagnosis not present

## 2012-07-30 DIAGNOSIS — R451 Restlessness and agitation: Secondary | ICD-10-CM

## 2012-07-30 DIAGNOSIS — I672 Cerebral atherosclerosis: Secondary | ICD-10-CM | POA: Diagnosis not present

## 2012-07-30 DIAGNOSIS — J189 Pneumonia, unspecified organism: Secondary | ICD-10-CM | POA: Diagnosis not present

## 2012-07-30 LAB — CBC WITH DIFFERENTIAL/PLATELET
Basophils Absolute: 0 10*3/uL (ref 0.0–0.1)
HCT: 39.8 % (ref 39.0–52.0)
Hemoglobin: 13.9 g/dL (ref 13.0–17.0)
Lymphocytes Relative: 25 % (ref 12–46)
Monocytes Absolute: 0.9 10*3/uL (ref 0.1–1.0)
Neutro Abs: 5.4 10*3/uL (ref 1.7–7.7)
Neutrophils Relative %: 62 % (ref 43–77)
RDW: 13.2 % (ref 11.5–15.5)
WBC: 8.6 10*3/uL (ref 4.0–10.5)

## 2012-07-30 LAB — URINALYSIS W MICROSCOPIC + REFLEX CULTURE
Bilirubin Urine: NEGATIVE
Glucose, UA: NEGATIVE mg/dL
Hgb urine dipstick: NEGATIVE
Ketones, ur: NEGATIVE mg/dL
Protein, ur: NEGATIVE mg/dL
pH: 7 (ref 5.0–8.0)

## 2012-07-30 LAB — COMPREHENSIVE METABOLIC PANEL
ALT: 15 U/L (ref 0–53)
AST: 22 U/L (ref 0–37)
Albumin: 3.8 g/dL (ref 3.5–5.2)
Alkaline Phosphatase: 88 U/L (ref 39–117)
CO2: 27 mEq/L (ref 19–32)
Chloride: 100 mEq/L (ref 96–112)
Creatinine, Ser: 0.93 mg/dL (ref 0.50–1.35)
GFR calc non Af Amer: >90 mL/min (ref >90)
Potassium: 3.3 mEq/L — ABNORMAL LOW (ref 3.5–5.1)
Total Bilirubin: 0.3 mg/dL (ref 0.3–1.2)

## 2012-07-30 LAB — RAPID URINE DRUG SCREEN, HOSP PERFORMED
Amphetamines: NOT DETECTED
Cocaine: NOT DETECTED
Opiates: NOT DETECTED
Tetrahydrocannabinol: NOT DETECTED

## 2012-07-30 LAB — AMMONIA: Ammonia: 46 umol/L (ref 11–60)

## 2012-07-30 MED ORDER — PHENYTOIN SODIUM EXTENDED 30 MG PO CAPS
150.0000 mg | ORAL_CAPSULE | Freq: Every day | ORAL | Status: DC
  Administered 2012-07-31 (×2): 150 mg via ORAL
  Filled 2012-07-30 (×3): qty 5

## 2012-07-30 MED ORDER — PHENYTOIN SODIUM EXTENDED 100 MG PO CAPS
400.0000 mg | ORAL_CAPSULE | Freq: Once | ORAL | Status: AC
  Administered 2012-07-30: 400 mg via ORAL
  Filled 2012-07-30: qty 4

## 2012-07-30 MED ORDER — ALUM & MAG HYDROXIDE-SIMETH 200-200-20 MG/5ML PO SUSP: 30.0000 mL | ORAL | Status: DC | PRN

## 2012-07-30 MED ORDER — IBUPROFEN 600 MG PO TABS
600.0000 mg | ORAL_TABLET | Freq: Three times a day (TID) | ORAL | Status: DC | PRN
  Administered 2012-08-01: 600 mg via ORAL
  Filled 2012-07-30: qty 1

## 2012-07-30 MED ORDER — ZIPRASIDONE MESYLATE 20 MG IM SOLR
20.0000 mg | Freq: Once | INTRAMUSCULAR | Status: AC
  Administered 2012-07-30: 20 mg via INTRAMUSCULAR

## 2012-07-30 MED ORDER — ZOLPIDEM TARTRATE 5 MG PO TABS: 5.0000 mg | ORAL_TABLET | Freq: Every evening | ORAL | Status: DC | PRN

## 2012-07-30 MED ORDER — SODIUM CHLORIDE 0.9 % IV SOLN
1000.0000 mg | INTRAVENOUS | Status: DC
  Filled 2012-07-30: qty 20

## 2012-07-30 MED ORDER — IOHEXOL 300 MG/ML  SOLN
100.0000 mL | Freq: Once | INTRAMUSCULAR | Status: AC | PRN
  Administered 2012-07-30: 100 mL via INTRAVENOUS

## 2012-07-30 MED ORDER — LORAZEPAM 1 MG PO TABS: 1.0000 mg | ORAL_TABLET | Freq: Three times a day (TID) | ORAL | Status: DC | PRN

## 2012-07-30 MED ORDER — LEVETIRACETAM 750 MG PO TABS
1500.0000 mg | ORAL_TABLET | Freq: Two times a day (BID) | ORAL | Status: DC
  Administered 2012-07-31 – 2012-08-01 (×4): 1500 mg via ORAL
  Filled 2012-07-30 (×5): qty 2

## 2012-07-30 MED ORDER — PHENYTOIN SODIUM EXTENDED 100 MG PO CAPS
400.0000 mg | ORAL_CAPSULE | Freq: Once | ORAL | Status: AC
  Administered 2012-07-30: 400 mg via ORAL
  Filled 2012-07-30: qty 4
  Filled 2012-07-30: qty 1

## 2012-07-30 MED ORDER — POTASSIUM CHLORIDE CRYS ER 20 MEQ PO TBCR
20.0000 meq | EXTENDED_RELEASE_TABLET | Freq: Once | ORAL | Status: AC
  Administered 2012-07-30: 20 meq via ORAL
  Filled 2012-07-30: qty 1

## 2012-07-30 MED ORDER — NICOTINE 21 MG/24HR TD PT24
21.0000 mg | MEDICATED_PATCH | Freq: Every day | TRANSDERMAL | Status: DC
  Filled 2012-07-30: qty 1

## 2012-07-30 MED ORDER — ONDANSETRON HCL 4 MG PO TABS
4.0000 mg | ORAL_TABLET | Freq: Three times a day (TID) | ORAL | Status: DC | PRN
  Administered 2012-07-31: 4 mg via ORAL
  Filled 2012-07-30: qty 1

## 2012-07-30 MED ORDER — ZIPRASIDONE MESYLATE 20 MG IM SOLR
10.0000 mg | Freq: Once | INTRAMUSCULAR | Status: DC
  Filled 2012-07-30: qty 20

## 2012-07-30 MED ORDER — LACOSAMIDE 50 MG PO TABS
150.0000 mg | ORAL_TABLET | Freq: Two times a day (BID) | ORAL | Status: DC
  Administered 2012-07-31 – 2012-08-01 (×4): 150 mg via ORAL
  Filled 2012-07-30 (×4): qty 3

## 2012-07-30 MED ORDER — RISPERIDONE 2 MG PO TABS
2.0000 mg | ORAL_TABLET | Freq: Every day | ORAL | Status: DC
  Administered 2012-07-31 – 2012-08-01 (×3): 2 mg via ORAL
  Filled 2012-07-30 (×3): qty 1

## 2012-07-30 MED ORDER — DULOXETINE HCL 30 MG PO CPEP
30.0000 mg | ORAL_CAPSULE | Freq: Every day | ORAL | Status: DC
  Administered 2012-07-31 – 2012-08-01 (×3): 30 mg via ORAL
  Filled 2012-07-30 (×3): qty 1

## 2012-07-30 MED ORDER — PHENYTOIN SODIUM EXTENDED 100 MG PO CAPS
200.0000 mg | ORAL_CAPSULE | Freq: Every morning | ORAL | Status: DC
  Administered 2012-07-31 – 2012-08-01 (×2): 200 mg via ORAL
  Filled 2012-07-30 (×3): qty 2

## 2012-07-30 MED ORDER — ACETAMINOPHEN 325 MG PO TABS: 650.0000 mg | ORAL_TABLET | ORAL | Status: DC | PRN
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
## 2012-07-30 NOTE — ED Provider Notes (Signed)
3:47 PM Called to the room, pt on the fool, slid from the chair. Pt brought in to ER via EMS for altered mental status. Pt confused. Hx of seizure, unknow if had a seizure episode. Pt thinks he is in [place] in the jungle. Pt aggitated, rolling around on the floor screaming. Recent hospitalization for Seizure, dilantin toxicity, AMS, encepholopathy.   Exam: Pt agitated, confused. PERRLA, head atraumatic. Normal extraocular movement. Pt moving all extremities, grip strength equal bilaterally. Unable to assess other parts of neuro exam, pt uncooperative, screaming, rolling on the floor.   Pt will be moved to the back. Ordered labs and CT head. Geodon for agitation.     Lottie Mussel, PA-C 07/30/12 1550

## 2012-07-30 NOTE — ED Notes (Signed)
PT placed back in the ED. Autumn RN given report of fall.

## 2012-07-30 NOTE — ED Notes (Signed)
Pt brought to psych unit in wheel chair pt wheeled into room pt then walked to bed from wheelchair. Pt got out of bed and walked into hallway  and became unsteady on his feet and began to fall. This nurse grabbed him under the arm and around his  back and pt went down to the ground on his buttocks.No signs or symptoms of injury.  Pt speaks limited english.  Pt speaks [place]. Charge nurse notified. AC of [facility] notified. Pt placed back over in Main ED.
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
## 2012-07-30 NOTE — ED Notes (Signed)
Report received from Autumn RN

## 2012-07-30 NOTE — ED Notes (Signed)
Pt become agitated during assessment, during taking VS pt started to shake and become violent, assisted to floor for safety by RN, at that time PA come in to assess.

## 2012-07-30 NOTE — Progress Notes (Addendum)
MEDICATION RELATED CONSULT NOTE - INITIAL   Pharmacy Consult for Phenytoin  Indication: seizure  No Known Allergies  Patient Measurements: Height: 5' 4.96" (165 cm) Weight: 173 lb (78.472 kg) IBW/kg (Calculated) : 61.41   Vital Signs: Temp: 98.5 F (36.9 C) (05/31 1540) BP: 180/101 mmHg (05/31 1615) Pulse Rate: 99 (05/31 1615) Intake/Output from previous day:   Intake/Output from this shift:    Labs:  Recent Labs  07/30/12 1638  WBC 8.6  HGB 13.9  HCT 39.8  PLT 299  CREATININE 0.93  ALBUMIN 3.8  PROT 8.7*  AST 22  ALT 15  ALKPHOS 88  BILITOT 0.3   Estimated Creatinine Clearance: 87.6 ml/min (by C-G formula based on Cr of 0.93).   Microbiology: No results found for this or any previous visit (from the past 720 hour(s)).  Medical History: Past Medical History  Diagnosis Date  . Seizures     Assessment: 11 yom with h/o seizure and ?traumatic brain injury presented 5/31 with AMS (confused, agitated). Unknown if had a seizure episode. Patient's PTA seizure meds appear to be Vimpat, Keppra, Dilantin.  Awaiting MR to be completed to ensure accurate meds and last doses taken.   5/31: Phenytoin level 4.3, albumin 3.8 for corrected phenytoin level of 5.    Goal of Therapy:  Total phenytoin level: 10-20 mcg/mL  Plan:   Phenytoin 1000 mg IV x 1 as loading dose given subtherapeutic phenytoin level  Follow up MR completion prior to ordering future doses of phenytoin.  Geoffry Paradise, PharmD, BCPS Pager: [phone] 6:42 PM Pharmacy #: [phone]  Addendum:   Phenytoin protocol discontinued. Pt never received IV loading dose of Phenytoin.  MD ordered for Phenytoin 400 mg PO x 2 doses. This is appropriate. Pharmacy will sign off.  Geoffry Paradise, PharmD, BCPS Pager: [phone] 7:31 PM Pharmacy #: [phone]

## 2012-07-30 NOTE — ED Notes (Signed)
Pt stood with red socks at side of bed. Pt steady standing at bedside, pt bent over at waste to pick up glove on floor, throwing glove in trash. Pt then walked steady to nurses station. Pt then stretched arms out and began swaying back and forth. Pt placed in wheelchair, escorted to psych ED room 34, pt stood from wheelchair, ambulated to bed, climbed into bed with no staff assistance, bed rail up for safety.

## 2012-07-30 NOTE — ED Notes (Signed)
Telepsych consult placed via fax and telephone

## 2012-07-31 MED ORDER — LORAZEPAM 1 MG PO TABS: 2.0000 mg | ORAL_TABLET | Freq: Four times a day (QID) | ORAL | Status: DC | PRN

## 2012-07-31 MED ORDER — ZIPRASIDONE MESYLATE 20 MG IM SOLR: 20.0000 mg | Freq: Two times a day (BID) | INTRAMUSCULAR | Status: DC | PRN

## 2012-07-31 MED ORDER — PHENYLEPHRINE IN HARD FAT 0.25 % RE SUPP
1.0000 | Freq: Two times a day (BID) | RECTAL | Status: DC
  Administered 2012-07-31 – 2012-08-01 (×2): 1 via RECTAL
  Filled 2012-07-31 (×3): qty 1

## 2012-07-31 NOTE — ED Provider Notes (Addendum)
Pt eval by telepsych and requires inpatient admission  2:15 PM  Date: 07/31/2012  Rate: 111  Rhythm: sinus tachycardia  QRS Axis: normal  Intervals: normal  ST/T Wave abnormalities: normal  Conduction Disutrbances:none  Narrative Interpretation:   Old EKG Reviewed: changes noted, heart rate slightly increased  Pt with orthostatic changes, will push oral fluids and give iv fluids if needed--will repeat orthostatics after fluids    Toy Baker, MD 07/31/12 9147  Toy Baker, MD 07/31/12 1421

## 2012-07-31 NOTE — ED Notes (Addendum)
Orthostatic V/S: Lying BP137/81, P109, O2 97%. Sitting BP125/80, P116, O2 99%. Standing BP113/76, P130, O2 98%. Dr.Allen aware.

## 2012-07-31 NOTE — ED Notes (Signed)
Pt ambulated unassisted to bathroom and back to room with steady gait
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
## 2012-07-31 NOTE — BH Assessment (Signed)
[facility] Assessment Progress Note   Pt is currently voluntary.  Pt cooperative today.  Has a friend that may come by and he may be able to translate.  It is unclear if pt can, at this point, sign himself into a facility.  IVC may be needed due to delusional behaviors that may have impaired ability to understand need for tx.    Placing SW or ACT will need to determine pt cognitive ability to sign self into tx if pt remains voluntary.

## 2012-07-31 NOTE — ED Notes (Signed)
Dr Silverio Lay notified, pt complaining of Hemmoroids.

## 2012-07-31 NOTE — BH Assessment (Signed)
[facility] Assessment Progress Note  Pt reviewed by Jacquelyne Balint, RN, Administrative Coordinator.  She reports that pt will require a 400 hall bed.  None are currently available and it is not anticipated that one will open today.  Pt will be considered for admission once a suitable bed becomes available.  Assessment counselor advised to seek alternative placement in the meantime.  Scherrie Merritts, LCSW, Assessment Counselor, notified at 11:50.  Doylene Canning, MA Assessment Counselor 07/31/2012 @ 11:58

## 2012-07-31 NOTE — ED Notes (Signed)
Up to the bathroom 

## 2012-08-01 DIAGNOSIS — R4182 Altered mental status, unspecified: Secondary | ICD-10-CM | POA: Diagnosis not present

## 2012-08-01 DIAGNOSIS — R569 Unspecified convulsions: Secondary | ICD-10-CM | POA: Diagnosis not present

## 2012-08-01 DIAGNOSIS — R404 Transient alteration of awareness: Secondary | ICD-10-CM | POA: Diagnosis not present

## 2012-08-01 MED ORDER — LACOSAMIDE 150 MG PO TABS: 150.0000 mg | ORAL_TABLET | Freq: Two times a day (BID) | ORAL | Status: DC

## 2012-08-01 MED ORDER — LEVETIRACETAM 750 MG PO TABS: 1500.0000 mg | ORAL_TABLET | Freq: Two times a day (BID) | ORAL | Status: DC

## 2012-08-01 MED ORDER — DULOXETINE HCL 30 MG PO CPEP: 30.0000 mg | ORAL_CAPSULE | Freq: Every day | ORAL | Status: DC

## 2012-08-01 MED ORDER — PHENYTOIN SODIUM EXTENDED 30 MG PO CAPS: 150.0000 mg | ORAL_CAPSULE | Freq: Every day | ORAL | Status: DC

## 2012-08-01 MED ORDER — LISINOPRIL 10 MG PO TABS
10.0000 mg | ORAL_TABLET | Freq: Every day | ORAL | Status: DC
  Administered 2012-08-01: 10 mg via ORAL
  Filled 2012-08-01: qty 1

## 2012-08-01 MED ORDER — PHENYTOIN SODIUM EXTENDED 100 MG PO CAPS: ORAL_CAPSULE | ORAL | Status: DC

## 2012-08-01 NOTE — Progress Notes (Signed)
PHARMACY NOTE:  ANTISEIZURE MEDICATIONS  Patient receiving lacosamide 150 mg PO BID, levetiracetam 1500 mg PO BID, phenytoin 200 mg PO AM, 150 mg PO hs.  Phenytoin level was 4.3 (subtherapeutic) on 07/30/12; 400 mg PO x 2 was given on 07/30/12 per MD order.  No seizure activity reported overnight per notes.  Recommend: 1. Continue lacosamide, levetiracetam, and phenytoin at current dosages. 2. Recheck phenytoin level on 08/04/12 unless seizure activity or signs/symptoms of toxicity occur in the interim.   Elie Goody, PharmD, BCPS Pager: [phone] 08/01/2012  10:14 AM

## 2012-08-01 NOTE — [facility] Counselor (Signed)
Called [name] [phone] again regarding patients disposition. He will be here to pick patient up within the hour. It is now 6:06pm.

## 2012-08-01 NOTE — [facility] Counselor (Addendum)
Pt to be discharged home, per recommendations of Josephine. Writer contacted patient's friend Kae Heller [phone]. He is also patient's caregiver/roomate. Left a voicemail letting him know that patient is ready to be discharged/pickup. Writer will continue trying to reach [name] regarding patients disposition.

## 2012-08-01 NOTE — ED Notes (Signed)
Patient was seen in the monitor to be on the floor, checked on patient, looked at the video, patient did not fall but did try to get out of bed from a sitting position. As he was going to a standing position he slid down to the floor and sat on his butt holding onto the bed w/his hands. No injuries. BP 106/69, pulse 109, temp 98.7 resp 28 and O2 sat is 100%. Patient had been medicated earlier w/IBU.

## 2012-08-01 NOTE — BHH Counselor (Signed)
Patient's friend contacted [name] [phone]. He was contacted to obtain collateral information about [name]. Sts that patient lives in the home with him and 3 other males that are all  Brothers. All residents are from [place].  Patient has been under the care of [name] for 20+ yrs. [place] he [place] in the [place]. Patient was on the run from [place] soldiers and sent to the [place] by [employer] as a "refugee". Patient speaks "[place]" and little to no [place]. Sts that most of the people of his culture were diagnosed with PTSD. Sts that patient was allowed to come here as a permanent resident in 1988. Patient is currently on disability.  Pt also suffering epileptic seizures. Patient typically mild seizures not allowing medical attention. However, patient has had several issues leading to medical hospitalizations. This past week (possible Wed or Thurs) patient had 5 major seizures. During this last episode patient awakened yelling and kicking. Also, sts that [name] is typically confused and "not himself" after a seizure but since having his seizures last week patient has "not been himself". Patient thought he was in the jungle, per [place]. Sts that patient was behaving as if he was living a "jungle life", as he did for 8 yrs of his life.   Patient's friend denies that patient is suicidal at this time. However, sts that he has endorsed suicidal thoughts in the past (1 yr ago). Sts that patient "stuck something in his leg". His trigger was pain in legs (chronic pain). Patient  No HI.  His friend also denies that patient uses alcohol and drugs. Sts, "We don't keep alcohol or drugs in our home". Patient has not appeared to be depressed or anxious. His appetite is typically normal. However, over the last few days he has reported sore a throat. Patient's sleep pattern are normal, per [name]. Patient has never been hospitalized for mental health reasons nor  has ever had a outpatient therapist/psychiatrist.   Patient's PCP is at [facility]. He also has a neurologist for his seizure disorder. He does not have a psychiatrist and/or therapist.   Patient is completely independent of all ADL's, per his friend [name]. 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

## 2012-08-01 NOTE — Consult Note (Addendum)
Reason for Consult: Seizure disorder, Altered mental status Referring Physician: ONEILL BAIS [name] is an 52 y.o. male.  HPI: This interview was through an interpreter.  Patient is a 52 year old Asian male who was brought in by EMS for post seizure fall and Psychosis.  Patient does not remember or recall how he got here but does remember he had a fall.  He does not remember getting anxious and panicky after the seizure thinking he was in [place] fighting in the jungle.  Patient has a history of PTSD and once in a while relive the experience of fighting in the jungle.  He denies SI/HI/AVH.  He reports he is stabilized on Cymbalta for his depression.  He denies drug use or alcohol use.  He lives with three roommates and one of them is his care giver.  On arrival to the ER his Dilantin level  Was subtherapeutic at 4.3ug/ml.  While in the ED his seizure medications were resumed.  We will be sending patient home to follow up with his neurologist.  He will be given prescription for Dilantin and Keppra. All of his radiological test were negative for fractures after the fall at home.  Past Medical History  Diagnosis Date  . Seizures     History reviewed. No pertinent past surgical history.  No family history on file.  Social History:  reports that he has quit smoking. His smoking use included Cigarettes. He smoked 0.00 packs per day. He does not have any smokeless tobacco history on file. His alcohol and drug histories are not on file.  Allergies: No Known Allergies  Medications: I have reviewed the patient's current medications.  Results for orders placed during the hospital encounter of 07/30/12 (from the past 48 hour(s))  CBC WITH DIFFERENTIAL     Status: None   Collection Time    07/30/12  4:38 PM      Result Value Range   WBC 8.6  4.0 - 10.5 K/uL   RBC 4.58  4.22 - 5.81 MIL/uL   Hemoglobin 13.9  13.0 - 17.0 g/dL   HCT 16.1  09.6 - 04.5 %   MCV 86.9  78.0 - 100.0 fL   MCH 30.3  26.0 -  34.0 pg   MCHC 34.9  30.0 - 36.0 g/dL   RDW 40.9  81.1 - 91.4 %   Platelets 299  150 - 400 K/uL   Neutrophils Relative % 62  43 - 77 %   Neutro Abs 5.4  1.7 - 7.7 K/uL   Lymphocytes Relative 25  12 - 46 %   Lymphs Abs 2.2  0.7 - 4.0 K/uL   Monocytes Relative 10  3 - 12 %   Monocytes Absolute 0.9  0.1 - 1.0 K/uL   Eosinophils Relative 2  0 - 5 %   Eosinophils Absolute 0.2  0.0 - 0.7 K/uL   Basophils Relative 0  0 - 1 %   Basophils Absolute 0.0  0.0 - 0.1 K/uL  COMPREHENSIVE METABOLIC PANEL     Status: Abnormal   Collection Time    07/30/12  4:38 PM      Result Value Range   Sodium 138  135 - 145 mEq/L   Potassium 3.3 (*) 3.5 - 5.1 mEq/L   Chloride 100  96 - 112 mEq/L   CO2 27  19 - 32 mEq/L   Glucose, Bld 111 (*) 70 - 99 mg/dL   BUN 8  6 - 23 mg/dL  Creatinine, Ser 0.93  0.50 - 1.35 mg/dL   Calcium 9.4  8.4 - 16.1 mg/dL   Total Protein 8.7 (*) 6.0 - 8.3 g/dL   Albumin 3.8  3.5 - 5.2 g/dL   AST 22  0 - 37 U/L   ALT 15  0 - 53 U/L   Alkaline Phosphatase 88  39 - 117 U/L   Total Bilirubin 0.3  0.3 - 1.2 mg/dL   GFR calc non Af Amer >90  >90 mL/min   GFR calc Af Amer >90  >90 mL/min   Comment:            The eGFR has been calculated     using the CKD EPI equation.     This calculation has not been     validated in all clinical     situations.     eGFR's persistently     <90 mL/min signify     possible Chronic Kidney Disease.  PHENYTOIN LEVEL, TOTAL     Status: Abnormal   Collection Time    07/30/12  4:38 PM      Result Value Range   Phenytoin Lvl 4.3 (*) 10.0 - 20.0 ug/mL  AMMONIA     Status: None   Collection Time    07/30/12  4:38 PM      Result Value Range   Ammonia 46  11 - 60 umol/L  URINALYSIS W MICROSCOPIC + REFLEX CULTURE     Status: None   Collection Time    07/30/12  5:58 PM      Result Value Range   Color, Urine YELLOW  YELLOW   APPearance CLEAR  CLEAR   Specific Gravity, Urine 1.009  1.005 - 1.030   pH 7.0  5.0 - 8.0   Glucose, UA NEGATIVE   NEGATIVE mg/dL   Hgb urine dipstick NEGATIVE  NEGATIVE   Bilirubin Urine NEGATIVE  NEGATIVE   Ketones, ur NEGATIVE  NEGATIVE mg/dL   Protein, ur NEGATIVE  NEGATIVE mg/dL   Urobilinogen, UA 0.2  0.0 - 1.0 mg/dL   Nitrite NEGATIVE  NEGATIVE   Leukocytes, UA NEGATIVE  NEGATIVE   WBC, UA 0-2  <3 WBC/hpf   RBC / HPF 0-2  <3 RBC/hpf  URINE RAPID DRUG SCREEN (HOSP PERFORMED)     Status: None   Collection Time    07/30/12  5:58 PM      Result Value Range   Opiates NONE DETECTED  NONE DETECTED   Cocaine NONE DETECTED  NONE DETECTED   Benzodiazepines NONE DETECTED  NONE DETECTED   Amphetamines NONE DETECTED  NONE DETECTED   Tetrahydrocannabinol NONE DETECTED  NONE DETECTED   Barbiturates NONE DETECTED  NONE DETECTED   Comment:            DRUG SCREEN FOR MEDICAL PURPOSES     ONLY.  IF CONFIRMATION IS NEEDED     FOR ANY PURPOSE, NOTIFY LAB     WITHIN 5 DAYS.                LOWEST DETECTABLE LIMITS     FOR URINE DRUG SCREEN     Drug Class       Cutoff (ng/mL)     Amphetamine      1000     Barbiturate      200     Benzodiazepine   200     Tricyclics       300     Opiates  300     Cocaine          300     THC              50    Ct Head Wo Contrast  07/30/2012   *RADIOLOGY REPORT*  Clinical Data: Altered mental status and confusion.  CT HEAD WITHOUT CONTRAST  Technique:  Contiguous axial images were obtained from the base of the skull through the vertex without contrast.  Comparison: 12/17/2011  Findings: Bone windows demonstrate mucosal thickening of the ethmoid air cells with  mucous retention cysts or polyps in the left maxillary sinus. Clear mastoid air cells.  Soft tissue windows demonstrate age advanced cerebellar and less so cerebral atrophy.  Patchy areas of hypoattenuation in the periventricular white matter.  Similar on the left and slightly increased on the right.  Favored to be related to small vessel ischemic change.  No  mass lesion, hemorrhage, hydrocephalus, acute  infarct, intra- axial, or extra-axial fluid collection.  IMPRESSION:  1. No acute intracranial abnormality. 2.  Advanced cerebral and cerebellar atrophy with progressive small vessel ischemic change in the periventricular white matter. 3.  Chronic sinus disease.   Original Report Authenticated By: Jeronimo Greaves, M.D.   Ct Head W Contrast  07/30/2012   *RADIOLOGY REPORT*  Clinical Data: Seizure.  Hallucinations.  Agitation.  CT HEAD WITH CONTRAST  Technique:  Contiguous axial images were obtained from the base of the skull through the vertex with intravenous contrast  Contrast: OMNIPAQUE IOHEXOL 300 MG/ML  SOLN  Comparison: Noncontrast head CT earlier today  Findings: There is no evidence of intracranial hemorrhage, brain edema or other signs of acute infarction.  There is no evidence of intracranial mass lesion or mass effect.  No abnormal extra-axial fluid collections are identified.  No evidence of enhancing mass lesion or other abnormal intracranial contrast enhancement.  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  Ventricles are normal in size. Mild to moderate cerebellar atrophy is also stable.  IMPRESSION:  1.  No acute intracranial findings. 2.  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 3.  Stable cerebellar atrophy.   Original Report Authenticated By: Myles Rosenthal, M.D.    Review of Systems  Constitutional: Negative.  Negative for fever, chills, weight loss, malaise/fatigue and diaphoresis.  HENT: Negative.  Negative for hearing loss, nosebleeds, congestion, sore throat, tinnitus and ear discharge.   Eyes: Negative.  Negative for blurred vision, double vision, photophobia, pain, discharge and redness.  Respiratory: Negative.  Negative for cough, hemoptysis, sputum production, shortness of breath, wheezing and stridor.   Cardiovascular: Negative.  Negative for chest pain, orthopnea, claudication, leg swelling and  PND.  Gastrointestinal: Negative.  Negative for heartburn, nausea, vomiting, abdominal pain, diarrhea, constipation, blood in stool and melena.  Genitourinary: Negative.  Negative for dysuria, urgency, frequency, hematuria and flank pain.  Musculoskeletal: Positive for falls (c/o does not remember what and how he fell dowm ? post seizure episode).       C/O GENERALIZED BODY ACHE AFTER FALLING AT HOME POSSIBLY POST SEIZURE FALL.  Skin:       Bruised area right cheek  Neurological: Positive for seizures (hx of seizure on medication.  Dilantin level subtherapeutic) and loss of consciousness (Oriented to self and name.  He knows he is in the hospital but does not know which one). Negative for weakness and headaches.  Endo/Heme/Allergies: Negative.  Psychiatric/Behavioral: Negative for depression (Stabilized on Cymbalta for depression), suicidal ideas, hallucinations, memory loss and substance abuse. The patient is not nervous/anxious and does not have insomnia.    Blood pressure 120/75, pulse 123, temperature 98.8 F (37.1 C), temperature source Oral, resp. rate 18, height 5' 4.96" (1.65 m), weight 78.472 kg (173 lb), SpO2 96.00%. Physical Exam  Vitals reviewed. Constitutional: He is oriented to person, place, and time. He appears well-developed and well-nourished.  HENT:  Head: Normocephalic and atraumatic.  Eyes: Conjunctivae and EOM are normal.  Neck: Normal range of motion. Neck supple.  Cardiovascular: Normal rate, regular rhythm, normal heart sounds and intact distal pulses.   Respiratory: Effort normal and breath sounds normal. No respiratory distress. He has no wheezes. He has no rales. He exhibits no tenderness.  GI: Soft. Bowel sounds are normal.  Musculoskeletal: He exhibits tenderness (c/o generalized body ache from a fall after seizure yesterday).  Neurological: He is oriented to person, place, and time.   Sleepy bur easily aroused.  Not ambulating due to generalized body ache and  pain after seizure and fall. Able to move in his bed by self  Skin: Skin is warm and dry. There is erythema (Right cheek from post seizure fall).    Assessment/Plan:  Will discharge home to care giver Will encourage to take his anti seizure  medications as ordered and to follow up with his neurologist  Dahlia Byes, C  PMHNP-bc 08/01/2012, 3:42 PM     I am agreed with the findings and involve in the treatment plan.

## 2012-08-01 NOTE — BHH Counselor (Signed)
Referral faxed to [facility] Christiane Ha at [facility] anticipates discharges later today.  Evette Cristal, [place] Assessment Counselor

## 2012-08-01 NOTE — BHH Counselor (Signed)
Writer made another call to patient's friend regarding his disposition and pickup from the ED; left another voicemail.

## 2012-08-01 NOTE — ED Provider Notes (Signed)
Patient resting comfortably this morning without any overnight problems. Patient has been recommended for inpatient psychiatric treatment, continue to pursue placement.  Gilda Crease, MD 08/01/12 [phone]

## 2012-08-04 ENCOUNTER — Other Ambulatory Visit: Payer: Self-pay

## 2012-08-04 ENCOUNTER — Emergency Department (HOSPITAL_COMMUNITY): Payer: Medicare Other

## 2012-08-04 ENCOUNTER — Encounter (HOSPITAL_COMMUNITY): Payer: Self-pay | Admitting: Emergency Medicine

## 2012-08-04 ENCOUNTER — Inpatient Hospital Stay (HOSPITAL_COMMUNITY)
Admission: EM | Admit: 2012-08-04 | Discharge: 2012-08-07 | DRG: 195 | Disposition: A | Discharge status: Home / Self Care | Payer: Medicare Other | Attending: Internal Medicine | Admitting: Internal Medicine

## 2012-08-04 DIAGNOSIS — G40909 Epilepsy, unspecified, not intractable, without status epilepticus: Secondary | ICD-10-CM | POA: Diagnosis present

## 2012-08-04 DIAGNOSIS — R269 Unspecified abnormalities of gait and mobility: Secondary | ICD-10-CM | POA: Diagnosis present

## 2012-08-04 DIAGNOSIS — R270 Ataxia, unspecified: Secondary | ICD-10-CM

## 2012-08-04 DIAGNOSIS — I498 Other specified cardiac arrhythmias: Secondary | ICD-10-CM | POA: Diagnosis not present

## 2012-08-04 DIAGNOSIS — F3289 Other specified depressive episodes: Secondary | ICD-10-CM | POA: Diagnosis present

## 2012-08-04 DIAGNOSIS — J189 Pneumonia, unspecified organism: Principal | ICD-10-CM | POA: Diagnosis present

## 2012-08-04 DIAGNOSIS — R509 Fever, unspecified: Secondary | ICD-10-CM | POA: Diagnosis not present

## 2012-08-04 DIAGNOSIS — F329 Major depressive disorder, single episode, unspecified: Secondary | ICD-10-CM | POA: Diagnosis present

## 2012-08-04 DIAGNOSIS — R531 Weakness: Secondary | ICD-10-CM

## 2012-08-04 DIAGNOSIS — R262 Difficulty in walking, not elsewhere classified: Secondary | ICD-10-CM | POA: Diagnosis present

## 2012-08-04 DIAGNOSIS — R6883 Chills (without fever): Secondary | ICD-10-CM | POA: Diagnosis not present

## 2012-08-04 DIAGNOSIS — I10 Essential (primary) hypertension: Secondary | ICD-10-CM | POA: Diagnosis present

## 2012-08-04 DIAGNOSIS — I672 Cerebral atherosclerosis: Secondary | ICD-10-CM | POA: Diagnosis not present

## 2012-08-04 DIAGNOSIS — R279 Unspecified lack of coordination: Secondary | ICD-10-CM | POA: Diagnosis not present

## 2012-08-04 DIAGNOSIS — F32A Depression, unspecified: Secondary | ICD-10-CM | POA: Diagnosis present

## 2012-08-04 DIAGNOSIS — R569 Unspecified convulsions: Secondary | ICD-10-CM | POA: Diagnosis present

## 2012-08-04 HISTORY — DX: Essential (primary) hypertension: I10

## 2012-08-04 LAB — CBC WITH DIFFERENTIAL/PLATELET
Basophils Relative: 0 % (ref 0–1)
Eosinophils Absolute: 0.3 10*3/uL (ref 0.0–0.7)
Eosinophils Relative: 4 % (ref 0–5)
Hemoglobin: 14.2 g/dL (ref 13.0–17.0)
Lymphs Abs: 1.4 10*3/uL (ref 0.7–4.0)
MCH: 30.7 pg (ref 26.0–34.0)
MCHC: 33.8 g/dL (ref 30.0–36.0)
MCV: 90.9 fL (ref 78.0–100.0)
Monocytes Relative: 9 % (ref 3–12)
Platelets: 375 10*3/uL (ref 150–400)
RBC: 4.62 MIL/uL (ref 4.22–5.81)

## 2012-08-04 LAB — CBC
MCH: 30.5 pg (ref 26.0–34.0)
MCV: 89.1 fL (ref 78.0–100.0)
Platelets: 378 10*3/uL (ref 150–400)
RDW: 13.2 % (ref 11.5–15.5)

## 2012-08-04 LAB — URINALYSIS, ROUTINE W REFLEX MICROSCOPIC
Leukocytes, UA: NEGATIVE
Nitrite: NEGATIVE
Specific Gravity, Urine: 1.015 (ref 1.005–1.030)
pH: 6.5 (ref 5.0–8.0)

## 2012-08-04 LAB — COMPREHENSIVE METABOLIC PANEL
BUN: 13 mg/dL (ref 6–23)
Calcium: 9.8 mg/dL (ref 8.4–10.5)
GFR calc Af Amer: 89 mL/min — ABNORMAL LOW (ref >90)
Glucose, Bld: 111 mg/dL — ABNORMAL HIGH (ref 70–99)
Total Protein: 8.3 g/dL (ref 6.0–8.3)

## 2012-08-04 LAB — VALPROIC ACID LEVEL: Valproic Acid Lvl: <10 ug/mL — ABNORMAL LOW (ref 50.0–100.0)

## 2012-08-04 LAB — TROPONIN I: Troponin I: <0.3 ng/mL (ref <0.30)

## 2012-08-04 MED ORDER — PHENYTOIN SODIUM EXTENDED 100 MG PO CAPS
200.0000 mg | ORAL_CAPSULE | Freq: Two times a day (BID) | ORAL | Status: DC
  Administered 2012-08-04 – 2012-08-07 (×6): 200 mg via ORAL
  Filled 2012-08-04 (×7): qty 2

## 2012-08-04 MED ORDER — LACOSAMIDE 50 MG PO TABS
100.0000 mg | ORAL_TABLET | Freq: Two times a day (BID) | ORAL | Status: DC
  Administered 2012-08-04 – 2012-08-05 (×3): 100 mg via ORAL
  Filled 2012-08-04 (×7): qty 2

## 2012-08-04 MED ORDER — DEXTROSE 5 % IV SOLN
500.0000 mg | Freq: Once | INTRAVENOUS | Status: AC
  Administered 2012-08-04: 500 mg via INTRAVENOUS
  Filled 2012-08-04: qty 500

## 2012-08-04 MED ORDER — AZITHROMYCIN 250 MG PO TABS: ORAL_TABLET | ORAL | Status: DC

## 2012-08-04 MED ORDER — DEXTROSE 5 % IV SOLN
500.0000 mg | INTRAVENOUS | Status: DC
  Filled 2012-08-04: qty 500

## 2012-08-04 MED ORDER — LISINOPRIL 10 MG PO TABS
10.0000 mg | ORAL_TABLET | Freq: Every day | ORAL | Status: DC
  Administered 2012-08-04 – 2012-08-07 (×4): 10 mg via ORAL
  Filled 2012-08-04 (×4): qty 1

## 2012-08-04 MED ORDER — ACETAMINOPHEN 650 MG RE SUPP: 650.0000 mg | Freq: Four times a day (QID) | RECTAL | Status: DC | PRN

## 2012-08-04 MED ORDER — ACETAMINOPHEN 325 MG PO TABS: 650.0000 mg | ORAL_TABLET | Freq: Four times a day (QID) | ORAL | Status: DC | PRN

## 2012-08-04 MED ORDER — LEVETIRACETAM 750 MG PO TABS
1500.0000 mg | ORAL_TABLET | Freq: Two times a day (BID) | ORAL | Status: DC
  Administered 2012-08-04 – 2012-08-07 (×6): 1500 mg via ORAL
  Filled 2012-08-04 (×7): qty 2

## 2012-08-04 MED ORDER — DEXTROSE 5 % IV SOLN
1.0000 g | INTRAVENOUS | Status: DC
  Administered 2012-08-05 – 2012-08-06 (×2): 1 g via INTRAVENOUS
  Filled 2012-08-04 (×3): qty 10

## 2012-08-04 MED ORDER — DEXTROSE 5 % IV SOLN
2.0000 g | Freq: Once | INTRAVENOUS | Status: AC
  Administered 2012-08-04: 2 g via INTRAVENOUS
  Filled 2012-08-04: qty 2

## 2012-08-04 MED ORDER — DULOXETINE HCL 30 MG PO CPEP
30.0000 mg | ORAL_CAPSULE | Freq: Every day | ORAL | Status: DC
  Administered 2012-08-04 – 2012-08-07 (×4): 30 mg via ORAL
  Filled 2012-08-04 (×4): qty 1

## 2012-08-04 MED ORDER — ENOXAPARIN SODIUM 40 MG/0.4ML ~~LOC~~ SOLN
40.0000 mg | SUBCUTANEOUS | Status: DC
  Administered 2012-08-04 – 2012-08-06 (×3): 40 mg via SUBCUTANEOUS
  Filled 2012-08-04 (×4): qty 0.4

## 2012-08-04 MED ORDER — SODIUM CHLORIDE 0.9 % IV SOLN: INTRAVENOUS | Status: DC

## 2012-08-04 MED ORDER — ACETAMINOPHEN 325 MG PO TABS
650.0000 mg | ORAL_TABLET | Freq: Once | ORAL | Status: AC
  Administered 2012-08-04: 650 mg via ORAL
  Filled 2012-08-04: qty 2

## 2012-08-04 MED ORDER — ONDANSETRON HCL 4 MG/2ML IJ SOLN: 4.0000 mg | Freq: Four times a day (QID) | INTRAMUSCULAR | Status: DC | PRN

## 2012-08-04 MED ORDER — ONDANSETRON HCL 4 MG PO TABS: 4.0000 mg | ORAL_TABLET | Freq: Four times a day (QID) | ORAL | Status: DC | PRN

## 2012-08-04 MED ORDER — SODIUM CHLORIDE 0.9 % IJ SOLN
3.0000 mL | Freq: Two times a day (BID) | INTRAMUSCULAR | Status: DC
  Administered 2012-08-04 – 2012-08-07 (×6): 3 mL via INTRAVENOUS

## 2012-08-04 NOTE — ED Notes (Signed)
Fourth attempt with language line. Patient speaks [name] EDE. Nurse and PA currently speaking with patient.

## 2012-08-04 NOTE — ED Notes (Signed)
Attempted to call [facility] no answer.

## 2012-08-04 NOTE — ED Notes (Signed)
Eugene at bedside explain patient has history of seizure last major being 7 days ago and was at [facility] discharged on Monday.  Patient when having "bad" seizure does forget intermittently.  EDP notified.

## 2012-08-04 NOTE — ED Provider Notes (Signed)
Medical screening examination/treatment/procedure(s) were performed by non-physician practitioner and as supervising physician I was immediately available for consultation/collaboration.  Yuliet Needs, MD 08/04/12 0510 

## 2012-08-04 NOTE — Progress Notes (Signed)
Interpertor called however no one able to to assist at this time. Will try again.

## 2012-08-04 NOTE — ED Notes (Signed)
Attempted to give floor report x 1  

## 2012-08-04 NOTE — ED Notes (Signed)
Used language line to communicate with patient. Discussed discharge paper work.  Patient states he does not understand and sometimes have headaches along with forgetful.  Patient answering and following commands appropriately.  PA notified and at bedside speaking with patient via translator phone.  Patient moves all extremities bilateral equal and strong no facial droop.  Patient ambulated unsteady. With Nurse and PA.  Additional test will be performed and patient verbalized understanding.

## 2012-08-04 NOTE — ED Notes (Signed)
Patient or patient family called 911 stating the need for a Doctor per EMS.  Patient speaks unknown language.  Upon arrival patient resting comfortably alert attempting to answer questions via three attempts with a language interpreter. Patient able to speak with vietnamese interpreter.  Denies any complaints at this time.  EMS reported BP 146/97 and HR 115.

## 2012-08-04 NOTE — H&P (Signed)
[facility] History and Physical  [name] WRU:[id number] DOB: 12-19-1960 DOA: 08/04/2012  Referring physician: ER physician. PCP: Pcp Not In System   Patient speaks Lossie Faes and it was difficult to get an interpreter. Most of the history obtained from ER physician who had discussed with his friend earlier.  Chief Complaint: Not feeling well.  HPI: [name] is a 52 y.o. male with known history of seizures hypertension and depression was brought to the ER the patient was not feeling well. As per the history provided patient has been having generalized myalgia for last one week and had gone to the ER last week with seizure-like episode and was found to have low Dilantin level at that time. Patient in the ER today was found to have also difficulty walking with ataxic-like gait. Patient's friend had told that this was new for the patient. Patient was able to move all his extremities does not have any headache or chest pain. Chest x-ray shows possible infiltrate and has been started on antibiotics for pneumonia.  Review of Systems: As presented in the history of presenting illness, rest negative.  Past Medical History  Diagnosis Date  . Hypertension   . Seizures    History reviewed. No pertinent past surgical history. Social History:  has no tobacco, alcohol, and drug history on file. At home. where does patient live-- Can do ADLs. Can patient participate in ADLs?  No Known Allergies  Family History  Problem Relation Age of Onset  . Family history unknown: Yes      Prior to Admission medications   Medication Sig Start Date End Date Taking? Authorizing Provider  DULoxetine (CYMBALTA) 30 MG capsule Take 30 mg by mouth daily.   Yes Historical Provider, MD  Lacosamide (VIMPAT) 100 MG TABS Take 100 mg by mouth 2 (two) times daily.   Yes Historical Provider, MD  levETIRAcetam (KEPPRA) 500 MG tablet Take 1,500 mg by mouth every 12 (twelve) hours.   Yes Historical Provider, MD   lisinopril (PRINIVIL,ZESTRIL) 10 MG tablet Take 10 mg by mouth daily.   Yes Historical Provider, MD  phenytoin (DILANTIN) 100 MG ER capsule Take 200 mg by mouth 2 (two) times daily.   Yes Historical Provider, MD  azithromycin (ZITHROMAX Z-PAK) 250 MG tablet 2 po day one, then 1 daily x 4 days 08/04/12   Wynetta Emery, PA-C   Physical Exam: Filed Vitals:   08/04/12 1845 08/04/12 1900 08/04/12 1930 08/04/12 2000  BP: 155/83 151/82 151/91 157/91  Pulse: 94 94 91 96  Temp:      TempSrc:      Resp: 18 23 20 23   SpO2: 99% 98% 96% 97%     General:  Well-developed well-nourished.  Eyes: Anicteric no pallor.  ENT: No discharge from the ears eyes nose mouth.  Neck: No mass felt.  Cardiovascular: S1-S2 heard.  Respiratory: No rhonchi or crepitations.  Abdomen: Soft nontender bowel sounds present.  Skin: No rash.  Musculoskeletal: No edema.  Psychiatric: Follows commands.  Neurologic: Moves all extremities.  Labs on Admission:  Basic Metabolic Panel:  Recent Labs Lab 08/04/12 1430  NA 141  K 4.8  CL 103  CO2 30  GLUCOSE 111*  BUN 13  CREATININE 1.07  CALCIUM 9.8   Liver Function Tests:  Recent Labs Lab 08/04/12 1430  AST 23  ALT 16  ALKPHOS 80  BILITOT 0.2*  PROT 8.3  ALBUMIN 3.9   No results found for this basename: LIPASE, AMYLASE,  in the last  168 hours No results found for this basename: AMMONIA,  in the last 168 hours CBC:  Recent Labs Lab 08/04/12 1430  WBC 7.7  NEUTROABS 5.3  HGB 14.2  HCT 42.0  MCV 90.9  PLT 375   Cardiac Enzymes: No results found for this basename: CKTOTAL, CKMB, CKMBINDEX, TROPONINI,  in the last 168 hours  BNP (last 3 results) No results found for this basename: PROBNP,  in the last 8760 hours CBG: No results found for this basename: GLUCAP,  in the last 168 hours  Radiological Exams on Admission: Dg Chest 2 View  08/04/2012   *RADIOLOGY REPORT*  Clinical Data: Fever and chills.  CHEST - 2 VIEW  Comparison:  None  Findings: The cardiac silhouette, mediastinal and hilar contours are within normal limits.  There is mild tortuosity of the thoracic aorta.  Slight increased lung markings in the right upper lobe could represent an early infiltrate/bronchopneumonia.  No pleural effusion.  The bony thorax is intact.  IMPRESSION: Possible early/developing right upper lobe infiltrate.   Original Report Authenticated By: Rudie Meyer, M.D.   Ct Head Wo Contrast  08/04/2012   *RADIOLOGY REPORT*  Clinical Data: Headache.  Altered mental status.  History of seizures 7 days ago.  CT HEAD WITHOUT CONTRAST  Technique:  Contiguous axial images were obtained from the base of the skull through the vertex without contrast.  Comparison: None.  Findings: Age indeterminate lacunar infarcts are present in the left frontal lobe (image number 17) and in the posterior left frontal lobe periventricular white matter (image number 19). Scattered paranasal sinus disease is present.  Mastoid air cells clear.  Intracranial atherosclerosis. Normal variant coaptation of the occipital horn of the right lateral ventricle.  No mass lesion, mass effect, midline shift, hydrocephalus, or hemorrhage. Mild streak artifact is present over the inferior anterior cranial fossa.  IMPRESSION: Small age indeterminant left frontal lobe lacunar infarcts.  No acute intracranial abnormality.   Original Report Authenticated By: Andreas Newport, M.D.     Assessment/Plan Principal Problem:   Pneumonia Active Problems:   HTN (hypertension)   Seizure   Depression   1. Pneumonia - continue ceftriaxone and Zithromax. Check urine strep antigen and Legionella antigen. 2. Difficulty walking - check Dilantin level and MRI brain. 3. Seizures - continue home medications. 4. History of depression - continue home medications.    Code Status: Full code.  Family Communication: None.  Disposition Plan: Admit to inpatient.    Dakwan Pridgen N. [facility] Pager [phone].  If 7PM-7AM, please contact night-coverage [email or web address] Password [facility] 08/04/2012, 8:43 PM

## 2012-08-04 NOTE — ED Provider Notes (Signed)
I assumed care of patient at signout to f/u on CT imaging A friend came for patient and reports he was just a [name] hospital ER His name initially was placed in wrong and this was adjusted and charts will be merged He is still mildly ataxic and friend reports this is new He will be admitted for ataxia and pneumonia He is on dilantin and possibly VPA for seizures, will check those levels D/w [facility] to admit and f/u on pending labs    Date: 08/04/2012  Rate: 103  Rhythm: sinus tachycardia  QRS Axis: left  Intervals: normal  ST/T Wave abnormalities: nonspecific ST changes  Conduction Disutrbances:none  Narrative Interpretation:   Old EKG Reviewed: unchanged from 07/31/2012    Joya Gaskins, MD 08/04/12 1942

## 2012-08-04 NOTE — ED Provider Notes (Signed)
History     CSN: [id number]  Arrival date & time 08/04/12  1309   First MD Initiated Contact with Patient 08/04/12 1310      Chief Complaint  Patient presents with  . Hypertension    (Consider location/radiation/quality/duration/timing/severity/associated sxs/prior treatment) HPI  Ernest Mallick is a 52 y.o. male brought in by EMS for complaining of diffuse myalgia, subjective fever and chills onset 7 days ago. Patient denies chest pain, shortness of breath, cough, abdominal pain, nausea vomiting, change in bowel or bladder habits, sick contacts. He states that the abrasions on his bilateral cheeks because he scratched himself. He denies head trauma, loss of consciousness, neck pain, dysarthria, ataxia.   Translator: Vietnamese: language is Counselling psychologist; can best communicate with EDE dialect interpreter   PCP: [facility] (Chart is marked for merge, no notes in EPIC available)  Pt states his meds are given to him by [employer] ([facility]?) [phone]    History reviewed. No pertinent past medical history.  History reviewed. No pertinent past surgical history.  No family history on file.  History  Substance Use Topics  . Smoking status: Not on file  . Smokeless tobacco: Not on file  . Alcohol Use: Not on file      Review of Systems  Constitutional: Negative for fever.  Respiratory: Negative for shortness of breath.   Cardiovascular: Negative for chest pain.  Gastrointestinal: Negative for nausea, vomiting, abdominal pain and diarrhea.  All other systems reviewed and are negative.    Allergies  Review of patient's allergies indicates no known allergies.  Home Medications  No current outpatient prescriptions on file.  BP 136/92  Pulse 102  Temp(Src) 99.2 F (37.3 C) (Oral)  Resp 22  SpO2 95%  Physical Exam  Nursing note and vitals reviewed. Constitutional: He is oriented to person, place, and time. He appears well-developed and well-nourished. No  distress.  HENT:  Head: Normocephalic and atraumatic.  Mouth/Throat: Oropharynx is clear and moist.  Eyes: Conjunctivae and EOM are normal. Pupils are equal, round, and reactive to light.  Neck: Normal range of motion. Neck supple. No JVD present.  Cardiovascular: Normal rate, regular rhythm and intact distal pulses.   Pulmonary/Chest: Effort normal and breath sounds normal. No stridor. No respiratory distress. He has no wheezes. He has no rales. He exhibits no tenderness.  Abdominal: Soft. Bowel sounds are normal. He exhibits no distension and no mass. There is no tenderness. There is no rebound and no guarding.  Musculoskeletal: Normal range of motion. He exhibits no edema and no tenderness.  Neurological: He is alert and oriented to person, place, and time.  MAE  Psychiatric: He has a normal mood and affect.    ED Course  Procedures (including critical care time)  Labs Reviewed  COMPREHENSIVE METABOLIC PANEL - Abnormal; Notable for the following:    Glucose, Bld 111 (*)    Total Bilirubin 0.2 (*)    GFR calc non Af Amer 77 (*)    GFR calc Af Amer 89 (*)    All other components within normal limits  CBC WITH DIFFERENTIAL  URINALYSIS, ROUTINE W REFLEX MICROSCOPIC   Dg Chest 2 View  08/04/2012   *RADIOLOGY REPORT*  Clinical Data: Fever and chills.  CHEST - 2 VIEW  Comparison: None  Findings: The cardiac silhouette, mediastinal and hilar contours are within normal limits.  There is mild tortuosity of the thoracic aorta.  Slight increased lung markings in the right upper lobe could represent an early infiltrate/bronchopneumonia.  No pleural effusion.  The bony thorax is intact.  IMPRESSION: Possible early/developing right upper lobe infiltrate.   Original Report Authenticated By: Rudie Meyer, M.D.     1. Community acquired pneumonia       MDM   Filed Vitals:   08/04/12 1357 08/04/12 1400 08/04/12 1430 08/04/12 1525  BP: 157/86 150/85 151/95 128/81  Pulse: 102 99 97 87  Temp:    98.1 F (36.7 C)   TempSrc:   Oral   Resp: 20 18 18 16   SpO2: 98% 98% 98% 98%     Ernest Mallick is a 52 y.o. male with diffuse myalgia, subjective fever and chills. The patient is hemodynamically stable. CBC shows no leukocytosis. No electrolyte abnormalities. Urinalysis is not consistent with infection. Chest x-ray is possible early right upper lobe infiltrate. I will give him Rocephin and discharge him home with azithromycin.  Patient will be picked up by his roommate who appears to be his caregiver,  Kae Heller.  Medications  acetaminophen (TYLENOL) tablet 650 mg (650 mg Oral Given 08/04/12 1425)  cefTRIAXone (ROCEPHIN) 2 g in dextrose 5 % 50 mL IVPB (2 g Intravenous New Bag/Given 08/04/12 1521)   When discussing the discharge plan with the translator phone, patient states he feels weak, has a headache, has difficulty remembering things, difficulty walking, difficulty understanding words. Strength is 5 out of 5x4 extremities, pupils are equal round reactive to light, ambulate with an ataxic gait.  Head CT ordered, I discussed and signed out the case to Dr. Bebe Shaggy, who will assume care and followup the head CT.        Wynetta Emery, PA-C 08/04/12 1608  Wynetta Emery, PA-C 08/05/12 [phone]

## 2012-08-04 NOTE — ED Notes (Signed)
Ambulated patient with MD & Tammy Sours, RN. Ambulation improved in comparison to earlier ambulation but still has an equally weak gait. Friend at bedside reports that patient typically ambulates better and is not at his ambulation baseline. EDP has decided to admit patient. Patient and friend made aware of plan of care and verbalizes understanding.

## 2012-08-04 NOTE — ED Notes (Signed)
Admitting MD at bedside.

## 2012-08-04 NOTE — ED Notes (Signed)
Spoke with Kae Heller [phone]. Will be able to pick patient up at [phone].  Asked if there is another option [name] stated no.

## 2012-08-04 NOTE — ED Notes (Addendum)
Patient friend at bedside patient speaks a form of [name] called [name].  The translator was Elissa Lovett which is very close.

## 2012-08-04 NOTE — ED Provider Notes (Signed)
Medical screening examination/treatment/procedure(s) were performed by non-physician practitioner and as supervising physician I was immediately available for consultation/collaboration.  Raeford Razor, MD 08/04/12 [phone]

## 2012-08-05 ENCOUNTER — Encounter (HOSPITAL_COMMUNITY): Payer: Self-pay | Admitting: *Deleted

## 2012-08-05 DIAGNOSIS — R5381 Other malaise: Secondary | ICD-10-CM

## 2012-08-05 DIAGNOSIS — R531 Weakness: Secondary | ICD-10-CM

## 2012-08-05 LAB — LEGIONELLA ANTIGEN, URINE: Legionella Antigen, Urine: NEGATIVE

## 2012-08-05 LAB — BASIC METABOLIC PANEL
BUN: 14 mg/dL (ref 6–23)
Chloride: 101 mEq/L (ref 96–112)
Glucose, Bld: 110 mg/dL — ABNORMAL HIGH (ref 70–99)
Potassium: 4.1 mEq/L (ref 3.5–5.1)

## 2012-08-05 LAB — VITAMIN B12: Vitamin B-12: 289 pg/mL (ref 211–911)

## 2012-08-05 LAB — CBC
HCT: 40.1 % (ref 39.0–52.0)
Hemoglobin: 13.6 g/dL (ref 13.0–17.0)
MCH: 30.6 pg (ref 26.0–34.0)
MCHC: 33.9 g/dL (ref 30.0–36.0)

## 2012-08-05 LAB — TSH: TSH: 0.119 u[IU]/mL — ABNORMAL LOW (ref 0.350–4.500)

## 2012-08-05 NOTE — Progress Notes (Signed)
[facility] PROGRESS NOTE  [name] RUE:[id number] DOB: 1960-11-03 DOA: 08/04/2012 PCP: Pcp Not In System  Assessment/Plan: Pneumonia -Strep pneumo urinary antigen is positive. -Will continue Rocephin and discontinue azithromycin at this time. -Await culture data.  Weakness -Suspect related to pneumonia. -Check B12/TSH. -Obtain PT consultation.  Seizure disorder -No active seizures so far while in the hospital. -Continued Vimpat and Dilantin. -His Dilantin level corrects to within therapeutic range according to his albumin.   DVT prophylaxis -Lovenox.  Code Status: Full code Family Communication: Patient only  Disposition Plan: Home in 24-48 hours   Consultants:  None   Antibiotics:  Rocephin day 2   Azithromycin day 2 (discontinued on 08/05/12)  Subjective: Patient has been interviewed and examined with an interpreter present in the room. His main complaints are extreme fatigue and weakness as well as cold and rigors.  Objective: Filed Vitals:   08/05/12 0400 08/05/12 0600 08/05/12 0800 08/05/12 1026  BP: 127/80 123/75 137/83 129/72  Pulse: 91 86 105   Temp:  98.6 F (37 C) 97.7 F (36.5 C)   TempSrc:  Oral    Resp: 20 20 17    SpO2: 99% 99% 94%     Intake/Output Summary (Last 24 hours) at 08/05/12 1223 Last data filed at 08/05/12 1026  Gross per 24 hour  Intake    363 ml  Output    600 ml  Net   -237 ml   There were no vitals filed for this visit.  Exam:   General:  Alert, awake, oriented x3  Cardiovascular: Regular rate and rhythm, no murmurs, rubs or gallops  Respiratory: Mild bilateral rhonchi  Abdomen: Soft, nontender, nondistended, positive bowel sounds, no masses or organomegaly noted  Extremities: No clubbing, cyanosis or edema, positive pedal pulses   Neurologic:  Grossly intact and nonfocal. I have not ambulated him  Data Reviewed: Basic Metabolic Panel:  Recent Labs Lab 08/04/12 1430 08/04/12 2213 08/05/12 0450  NA  141  --  138  K 4.8  --  4.1  CL 103  --  101  CO2 30  --  30  GLUCOSE 111*  --  110*  BUN 13  --  14  CREATININE 1.07 0.96 1.11  CALCIUM 9.8  --  9.5   Liver Function Tests:  Recent Labs Lab 08/04/12 1430  AST 23  ALT 16  ALKPHOS 80  BILITOT 0.2*  PROT 8.3  ALBUMIN 3.9   No results found for this basename: LIPASE, AMYLASE,  in the last 168 hours No results found for this basename: AMMONIA,  in the last 168 hours CBC:  Recent Labs Lab 08/04/12 1430 08/04/12 2213 08/05/12 0450  WBC 7.7 7.2 7.8  NEUTROABS 5.3  --   --   HGB 14.2 13.7 13.6  HCT 42.0 40.0 40.1  MCV 90.9 89.1 90.1  PLT 375 378 364   Cardiac Enzymes:  Recent Labs Lab 08/04/12 2213  TROPONINI <0.30   BNP (last 3 results) No results found for this basename: PROBNP,  in the last 8760 hours CBG: No results found for this basename: GLUCAP,  in the last 168 hours  No results found for this or any previous visit (from the past 240 hour(s)).   Studies: Dg Chest 2 View  08/04/2012   *RADIOLOGY REPORT*  Clinical Data: Fever and chills.  CHEST - 2 VIEW  Comparison: None  Findings: The cardiac silhouette, mediastinal and hilar contours are within normal limits.  There is mild tortuosity of the thoracic  aorta.  Slight increased lung markings in the right upper lobe could represent an early infiltrate/bronchopneumonia.  No pleural effusion.  The bony thorax is intact.  IMPRESSION: Possible early/developing right upper lobe infiltrate.   Original Report Authenticated By: Rudie Meyer, M.D.   Ct Head Wo Contrast  08/04/2012   *RADIOLOGY REPORT*  Clinical Data: Headache.  Altered mental status.  History of seizures 7 days ago.  CT HEAD WITHOUT CONTRAST  Technique:  Contiguous axial images were obtained from the base of the skull through the vertex without contrast.  Comparison: None.  Findings: Age indeterminate lacunar infarcts are present in the left frontal lobe (image number 17) and in the posterior left frontal  lobe periventricular white matter (image number 19). Scattered paranasal sinus disease is present.  Mastoid air cells clear.  Intracranial atherosclerosis. Normal variant coaptation of the occipital horn of the right lateral ventricle.  No mass lesion, mass effect, midline shift, hydrocephalus, or hemorrhage. Mild streak artifact is present over the inferior anterior cranial fossa.  IMPRESSION: Small age indeterminant left frontal lobe lacunar infarcts.  No acute intracranial abnormality.   Original Report Authenticated By: Andreas Newport, M.D.    Scheduled Meds: . cefTRIAXone (ROCEPHIN)  IV  1 g Intravenous Q24H  . DULoxetine  30 mg Oral Daily  . enoxaparin (LOVENOX) injection  40 mg Subcutaneous Q24H  . lacosamide  100 mg Oral BID  . levETIRAcetam  1,500 mg Oral Q12H  . lisinopril  10 mg Oral Daily  . phenytoin  200 mg Oral BID  . sodium chloride  3 mL Intravenous Q12H   Continuous Infusions: . sodium chloride      Principal Problem:   Pneumonia Active Problems:   HTN (hypertension)   Seizure   Depression   Weakness generalized    Time spent: 35 minutes    HERNANDEZ ACOSTA,Aneesh Faller  [facility] Pager [phone]  If 7PM-7AM, please contact night-coverage at [email or web address], password [facility] 08/05/2012, 12:23 PM  LOS: 1 day

## 2012-08-05 NOTE — Evaluation (Signed)
Physical Therapy Evaluation Patient Details Name: [name] MRN: [id number] DOB: [birth year] Today's Date: 08/05/2012 Time: 0865-7846 PT Time Calculation (min): 30 min  PT Assessment / Plan / Recommendation Clinical Impression  Pt is a 52 yo man who presents with pneumonia. Pt demonstrated mod. Independence with basic mobility on the unit. Feel pt is safe to d/c home alone or with family. I was unable to obtain any history of prior level of function or living situation.  Pt is safe to ambulate in the room without assistance.  Pt does not need any further PT services and is d/c from care.     PT Assessment  Patent does not need any further PT services    Follow Up Recommendations  No PT follow up    Does the patient have the potential to tolerate intense rehabilitation      Barriers to Discharge        Equipment Recommendations  None recommended by PT    Recommendations for Other Services     Frequency      Precautions / Restrictions Precautions Precautions: None Restrictions Weight Bearing Restrictions: No   Pertinent Vitals/Pain       Mobility  Bed Mobility Bed Mobility: Supine to Sit Supine to Sit: 6: Modified independent (Device/Increase time) Transfers Transfers: Sit to Stand;Stand to Sit Sit to Stand: 6: Modified independent (Device/Increase time) Stand to Sit: 6: Modified independent (Device/Increase time) Details for Transfer Assistance: slow Ambulation/Gait Ambulation/Gait Assistance: 6: Modified independent (Device/Increase time) Ambulation Distance (Feet): 125 Feet Assistive device: None Gait Pattern: Step-through pattern;Decreased stride length Gait velocity: slightly decreased Stairs: No    Exercises     PT Diagnosis:    PT Problem List:   PT Treatment Interventions:     PT Goals    Visit Information  Last PT Received On: 08/05/12 Assistance Needed: +1    Subjective Data  Subjective: "Thirsty...cold..." Patient Stated Goal: unable to  obtain due to language   Prior Functioning  Home Living Lives With: Other (Comment) (unable to obtain history) Additional Comments: unable to obtain prior level of function and home information due to interperter not available and no family present. Communication Communication: Prefers language other than English    Cognition  Cognition Arousal/Alertness: Awake/alert Behavior During Therapy: WFL for tasks assessed/performed Overall Cognitive Status: Within Functional Limits for tasks assessed    Extremity/Trunk Assessment Right Lower Extremity Assessment RLE ROM/Strength/Tone: WFL for tasks assessed Left Lower Extremity Assessment LLE ROM/Strength/Tone: WFL for tasks assessed Trunk Assessment Trunk Assessment: Normal   Balance Balance Balance Assessed: Yes Static Standing Balance Static Standing - Balance Support: No upper extremity supported Static Standing - Level of Assistance: 6: Modified independent (Device/Increase time) High Level Balance High Level Balance Activites: Head turns;Turns;Direction changes High Level Balance Comments: mod independent on level surfaces  End of Session PT - End of Session Equipment Utilized During Treatment: Gait belt Activity Tolerance: Patient tolerated treatment well Patient left: in chair;with call bell/phone within reach Nurse Communication: Mobility status  GP     [name] 08/05/2012, 9:28 AM

## 2012-08-05 NOTE — Progress Notes (Signed)
UR Completed Jashanti Clinkscale Graves-Bigelow, RN,BSN [phone]  

## 2012-08-05 NOTE — Progress Notes (Signed)
Nutrition Brief Note  Patient identified on the Malnutrition Screening Tool (MST) Report for recent weight loss without trying (patient unsure).  There is no height or weight on file to calculate BMI.  Wt Readings from Last 10 Encounters:  No data found for Wt    Current diet order is Heart Healthy, patient is consuming approximately 100% of meals at this time. Labs and medications reviewed.   No nutrition interventions warranted at this time. If nutrition issues arise, please consult RD.   Maureen Chatters, RD, LDN Pager #: [phone] After-Hours Pager #: [phone]

## 2012-08-05 NOTE — Progress Notes (Signed)
Physical Therapy Discharge Patient Details Name: [name] MRN: [id number] DOB: 08/13/1960 Today's Date: 08/05/2012 Time:  -     Patient discharged from PT services secondary to Pt at modified independant level as based upon eval earlier today by Lilyan Punt, PT.  No acute PT needs..  Please see latest therapy progress note for current level of functioning and progress toward goals.      GP     Donnella Sham 08/05/2012, 3:08 PM

## 2012-08-05 NOTE — Progress Notes (Signed)
Interpretor paged [phone]. However, was unable to get in touch with interpretor. Will cont to monitor pt.

## 2012-08-06 DIAGNOSIS — F329 Major depressive disorder, single episode, unspecified: Secondary | ICD-10-CM

## 2012-08-06 MED ORDER — LACOSAMIDE 50 MG PO TABS
100.0000 mg | ORAL_TABLET | Freq: Two times a day (BID) | ORAL | Status: DC
  Administered 2012-08-06 – 2012-08-07 (×3): 100 mg via ORAL
  Filled 2012-08-06 (×3): qty 2

## 2012-08-06 MED ORDER — LEVOFLOXACIN 750 MG PO TABS: 750.0000 mg | ORAL_TABLET | Freq: Every day | ORAL | Status: DC

## 2012-08-06 NOTE — Discharge Summary (Signed)
Physician Discharge Summary  [name] AVW:[id number] DOB: 1960-11-19 DOA: 08/04/2012  PCP: Pcp Not In System  Admit date: 08/04/2012 Discharge date: 08/06/2012  Time spent: Greater than 30 minutes  Recommendations for Outpatient Follow-up:  -Advised to followup with primary care provider in 2 weeks. -Chest x-ray should be repeated in 4-6 weeks to ensure complete resolution of his pneumonia.   Discharge Diagnoses:  Principal Problem:   Pneumonia Active Problems:   HTN (hypertension)   Seizure   Depression   Weakness generalized   Discharge Condition: Stable and improved  Filed Weights   08/05/12 1500 08/06/12 0000  Weight: 80.74 kg (178 lb) 81.33 kg (179 lb 4.8 oz)    History of present illness:  Patient is a 52 y.o. male with known history of seizures hypertension and depression was brought to the ER the patient was not feeling well. As per the history provided patient has been having generalized myalgia for last one week and had gone to the ER last week with seizure-like episode and was found to have low Dilantin level at that time. Patient in the ER today was found to have also difficulty walking with ataxic-like gait. Patient's friend had told that this was new for the patient. Patient was able to move all his extremities does not have any headache or chest pain. Chest x-ray shows possible infiltrate and has been started on antibiotics for pneumonia. We were asked to admit him for further evaluation and management.   Hospital Course:   Pneumonia  -Strep pneumo urinary antigen is positive.  -Received 2 days of IV Rocephin and azithromycin that will be transitioned to 7 more days of Levaquin at time of discharge. -culture data is negative to date.  Weakness  -Suspect related to pneumonia.  -Has been evaluated by physical therapy and they're not recommending any followup.  Seizure disorder  -No active seizures while in the hospital.  -Continued Vimpat and Dilantin.  -His  Dilantin level corrects to within therapeutic range according to his albumin.      Procedures:  None   Consultations:  None  Discharge Instructions  Discharge Orders   Future Orders Complete By Expires     Diet - low sodium heart healthy  As directed     Discontinue IV  As directed     Increase activity slowly  As directed         Medication List    TAKE these medications       DULoxetine 30 MG capsule  Commonly known as:  CYMBALTA  Take 30 mg by mouth daily.     levETIRAcetam 500 MG tablet  Commonly known as:  KEPPRA  Take 1,500 mg by mouth every 12 (twelve) hours.     levofloxacin 750 MG tablet  Commonly known as:  LEVAQUIN  Take 1 tablet (750 mg total) by mouth daily.     lisinopril 10 MG tablet  Commonly known as:  PRINIVIL,ZESTRIL  Take 10 mg by mouth daily.     phenytoin 100 MG ER capsule  Commonly known as:  DILANTIN  Take 200 mg by mouth 2 (two) times daily.     VIMPAT 100 MG Tabs  Generic drug:  Lacosamide  Take 100 mg by mouth 2 (two) times daily.       No Known Allergies     Follow-up Information   Schedule an appointment as soon as possible for a visit in 2 weeks to follow up. (With your regular provider)  The results of significant diagnostics from this hospitalization (including imaging, microbiology, ancillary and laboratory) are listed below for reference.    Significant Diagnostic Studies: Dg Chest 2 View  08/04/2012   *RADIOLOGY REPORT*  Clinical Data: Fever and chills.  CHEST - 2 VIEW  Comparison: None  Findings: The cardiac silhouette, mediastinal and hilar contours are within normal limits.  There is mild tortuosity of the thoracic aorta.  Slight increased lung markings in the right upper lobe could represent an early infiltrate/bronchopneumonia.  No pleural effusion.  The bony thorax is intact.  IMPRESSION: Possible early/developing right upper lobe infiltrate.   Original Report Authenticated By: Rudie Meyer, M.D.   Ct  Head Wo Contrast  08/04/2012   *RADIOLOGY REPORT*  Clinical Data: Headache.  Altered mental status.  History of seizures 7 days ago.  CT HEAD WITHOUT CONTRAST  Technique:  Contiguous axial images were obtained from the base of the skull through the vertex without contrast.  Comparison: None.  Findings: Age indeterminate lacunar infarcts are present in the left frontal lobe (image number 17) and in the posterior left frontal lobe periventricular white matter (image number 19). Scattered paranasal sinus disease is present.  Mastoid air cells clear.  Intracranial atherosclerosis. Normal variant coaptation of the occipital horn of the right lateral ventricle.  No mass lesion, mass effect, midline shift, hydrocephalus, or hemorrhage. Mild streak artifact is present over the inferior anterior cranial fossa.  IMPRESSION: Small age indeterminant left frontal lobe lacunar infarcts.  No acute intracranial abnormality.   Original Report Authenticated By: Andreas Newport, M.D.    Microbiology: No results found for this or any previous visit (from the past 240 hour(s)).   Labs: Basic Metabolic Panel:  Recent Labs Lab 08/04/12 1430 08/04/12 2213 08/05/12 0450  NA 141  --  138  K 4.8  --  4.1  CL 103  --  101  CO2 30  --  30  GLUCOSE 111*  --  110*  BUN 13  --  14  CREATININE 1.07 0.96 1.11  CALCIUM 9.8  --  9.5   Liver Function Tests:  Recent Labs Lab 08/04/12 1430  AST 23  ALT 16  ALKPHOS 80  BILITOT 0.2*  PROT 8.3  ALBUMIN 3.9   No results found for this basename: LIPASE, AMYLASE,  in the last 168 hours No results found for this basename: AMMONIA,  in the last 168 hours CBC:  Recent Labs Lab 08/04/12 1430 08/04/12 2213 08/05/12 0450  WBC 7.7 7.2 7.8  NEUTROABS 5.3  --   --   HGB 14.2 13.7 13.6  HCT 42.0 40.0 40.1  MCV 90.9 89.1 90.1  PLT 375 378 364   Cardiac Enzymes:  Recent Labs Lab 08/04/12 2213  TROPONINI <0.30   BNP: BNP (last 3 results) No results found for this  basename: PROBNP,  in the last 8760 hours CBG: No results found for this basename: GLUCAP,  in the last 168 hours     Signed:  Chaya Jan  [facility] Pager: [phone] 08/06/2012, 10:36 AM

## 2012-08-06 NOTE — ED Provider Notes (Signed)
Medical screening examination/treatment/procedure(s) were performed by non-physician practitioner and as supervising physician I was immediately available for consultation/collaboration.   Suzi Roots, MD 08/06/12 [phone]

## 2012-08-07 DIAGNOSIS — R279 Unspecified lack of coordination: Secondary | ICD-10-CM

## 2012-08-07 MED ORDER — LEVOFLOXACIN 750 MG PO TABS: 750.0000 mg | ORAL_TABLET | Freq: Every day | ORAL | Status: DC

## 2012-08-07 NOTE — Clinical Social Work Note (Signed)
Clinical Social Worker provided RN with bus pass for patient transportation at discharge.  [name], [place] [phone]

## 2012-08-07 NOTE — Progress Notes (Signed)
Patient seen and examined. Questions asked and answered via interpreter phone line. He was discharged home yesterday, but was unable to leave as we had difficulty locating a translator and no one was available to pick patient up from the hospital. SW has provided patient a bus pass, and we have asked CM to see if they can assist him with his antibiotic. Plan to DC home today.  Peggye Pitt, MD [facility] Pager: [phone]

## 2012-08-07 NOTE — Progress Notes (Signed)
Discharge review done with patient's friend Mr. [place]_Eugene.   He acknowledged understanding of information provided.  Prescriptions given per MD's order.  Patient is stable and discharged home with [name]. Ephriam Knuckles

## 2012-08-08 ENCOUNTER — Encounter (HOSPITAL_COMMUNITY): Payer: Self-pay | Admitting: *Deleted

## 2012-09-06 ENCOUNTER — Other Ambulatory Visit: Payer: Self-pay | Admitting: Neurology

## 2013-01-17 DIAGNOSIS — Z131 Encounter for screening for diabetes mellitus: Secondary | ICD-10-CM | POA: Diagnosis not present

## 2013-01-17 DIAGNOSIS — Z13220 Encounter for screening for lipoid disorders: Secondary | ICD-10-CM | POA: Diagnosis not present

## 2013-01-17 DIAGNOSIS — I10 Essential (primary) hypertension: Secondary | ICD-10-CM | POA: Diagnosis not present

## 2013-01-17 DIAGNOSIS — R059 Cough, unspecified: Secondary | ICD-10-CM | POA: Diagnosis not present

## 2013-01-17 DIAGNOSIS — Z125 Encounter for screening for malignant neoplasm of prostate: Secondary | ICD-10-CM | POA: Diagnosis not present

## 2013-02-13 ENCOUNTER — Other Ambulatory Visit: Payer: Self-pay | Admitting: Gastroenterology

## 2013-02-13 DIAGNOSIS — I10 Essential (primary) hypertension: Secondary | ICD-10-CM | POA: Diagnosis not present

## 2013-02-13 DIAGNOSIS — Z1211 Encounter for screening for malignant neoplasm of colon: Secondary | ICD-10-CM | POA: Diagnosis not present

## 2013-02-13 DIAGNOSIS — R569 Unspecified convulsions: Secondary | ICD-10-CM | POA: Diagnosis not present

## 2013-02-24 ENCOUNTER — Encounter (HOSPITAL_COMMUNITY): Payer: Self-pay | Admitting: Pharmacy Technician

## 2013-03-01 ENCOUNTER — Encounter (HOSPITAL_COMMUNITY): Payer: Self-pay | Admitting: *Deleted

## 2013-03-01 DIAGNOSIS — Z758 Other problems related to medical facilities and other health care: Secondary | ICD-10-CM

## 2013-03-01 DIAGNOSIS — Z789 Other specified health status: Secondary | ICD-10-CM

## 2013-03-01 HISTORY — DX: Other specified health status: Z78.9

## 2013-03-01 HISTORY — DX: Other problems related to medical facilities and other health care: Z75.8

## 2013-03-01 NOTE — Progress Notes (Signed)
Contact: Janeann Merl. Lives with and is HCPOA. Will interpret for pt. and sign forms.

## 2013-03-04 ENCOUNTER — Other Ambulatory Visit: Payer: Self-pay | Admitting: Neurology

## 2013-03-08 ENCOUNTER — Telehealth: Payer: Self-pay | Admitting: *Deleted

## 2013-03-08 NOTE — Telephone Encounter (Signed)
[name], continue help this patient for Vimpat assistance program, please help him soon, make sure he does not run out of the medications,

## 2013-03-09 NOTE — Telephone Encounter (Signed)
I called and spoke with [name].  Gave him the number for PAP [phone]) and asked him to contact them regarding assistance.  They will go over criteria, and if patient meets requirements, they will direct him from that point.  As well, I have asked [name] to leave samples at the front desk for the patient.  [name] will stop in and pick them up, and schedule an appt for the patient.  They will call us back if further assistance is needed.

## 2013-03-13 ENCOUNTER — Encounter (INDEPENDENT_AMBULATORY_CARE_PROVIDER_SITE_OTHER): Payer: Self-pay

## 2013-03-13 ENCOUNTER — Telehealth: Payer: Self-pay

## 2013-03-13 ENCOUNTER — Ambulatory Visit (INDEPENDENT_AMBULATORY_CARE_PROVIDER_SITE_OTHER): Payer: Medicare Other | Admitting: Nurse Practitioner

## 2013-03-13 ENCOUNTER — Encounter: Payer: Self-pay | Admitting: Nurse Practitioner

## 2013-03-13 VITALS — BP 120/75 | HR 81 | Ht 66.0 in | Wt 195.0 lb

## 2013-03-13 DIAGNOSIS — G40319 Generalized idiopathic epilepsy and epileptic syndromes, intractable, without status epilepticus: Secondary | ICD-10-CM | POA: Insufficient documentation

## 2013-03-13 DIAGNOSIS — G40219 Localization-related (focal) (partial) symptomatic epilepsy and epileptic syndromes with complex partial seizures, intractable, without status epilepticus: Secondary | ICD-10-CM | POA: Diagnosis not present

## 2013-03-13 DIAGNOSIS — Z79899 Other long term (current) drug therapy: Secondary | ICD-10-CM | POA: Diagnosis not present

## 2013-03-13 MED ORDER — PHENYTOIN SODIUM EXTENDED 100 MG PO CAPS: 200.0000 mg | ORAL_CAPSULE | Freq: Two times a day (BID) | ORAL | Status: DC

## 2013-03-13 MED ORDER — LEVETIRACETAM 1000 MG PO TABS: 1500.0000 mg | ORAL_TABLET | Freq: Two times a day (BID) | ORAL | Status: DC

## 2013-03-13 NOTE — Telephone Encounter (Signed)
Since we do not have the patients Medicare Drug Card on file, I contacted the ins to obtain this info.  They said he has [facility] ID # [id number].  They indicate no prior auth is required for Vimpat.  It is covered under the patient's plan, however his co-pay is currently high, as he has not yet met his deductible.  Once it has been met, his co-pay should go back to normal.  Patient would like to try to obtain PAP.  I am unsure if they will approve this request since the patients insurance does cover the med, but we will gladly submit the paperwork once completed.  Caregiver is aware proof of income is needed to submit along with application.

## 2013-03-13 NOTE — Progress Notes (Signed)
[facility]  PATIENT: Dshawn Avitabile DOB: 03-Apr-1960   REASON FOR VISIT: Followup for seizure disorder   HISTORY OF PRESENT ILLNESS:Mr Wuertz, 53 year old male returns for followup. He does not speak [place]. He is accompanied by his 24/ 7 caregiver. He has a seizure disorder and is currently on Dilantin 400 mg daily, Keppra 1500 mg twice daily and Vimpat 200 twice daily. Patient's last Dilantin level was 9.3 and he went to the emergency room back in the summer. The caregiver says they have been unable to get his Vimpat and he went several weeks without the medication and had seizures. He has a sample pack and is currently taking 150 twice daily. His last seizure was approximately 2 weeks ago. He has patient assistance form to see if we can get his Vimpat for him. His primary insurance is Medicare. Appetite is reportedly good, sleeps well at night. No falls, no balance issues. No recent labs, patient took his meds this morning.    HISTORY: He does not speak [place] and understands very little spoken [place]. He has a caregiver with him today who intreprets.  By history he has mild cerebral palsy and a possible head injury in childhood. He has hx of gout and HTN.  He has a 4th grade education and lives with his caregiver.  His brother says his  seizures have been in good control.  He continues to have problems with short term memory and learning new information.  He has no problems with day to day functioning.  He is independent with ADL's. He had an admission to inpatient [facility] in January and June for uncontrolled anger.   He has been tapered off phenobarbital, he is not taking Dilantin 100 mg 2 tablets twice a day,  levetiracetam 1000mg  1 and a half tablets twice a day. He continues to have seizures, the longest stretch in between is 3 weeks  REVIEW OF SYSTEMS: Full 14 system review of systems performed and notable only for those listed, all others are neg:    Constitutional: N/A  Cardiovascular: N/A  Ear/Nose/Throat: N/A  Skin: N/A  Eyes: N/A  Respiratory: N/A  Gastroitestinal: N/A  Hematology/Lymphatic: N/A  Endocrine: N/A Musculoskeletal:N/A  Allergy/Immunology: N/A  Neurological: Seizure  Psychiatric: Depression  ALLERGIES: No Known Allergies  HOME MEDICATIONS: Outpatient Prescriptions Prior to Visit  Medication Sig Dispense Refill  . acetaminophen (TYLENOL) 325 MG tablet Take 650 mg by mouth every 6 (six) hours as needed for mild pain or moderate pain.      [name] levETIRAcetam (KEPPRA) 1000 MG tablet Take 1,500 mg by mouth 2 (two) times daily.      [name] lisinopril (PRINIVIL,ZESTRIL) 10 MG tablet Take 10 mg by mouth daily with breakfast.       . phenytoin (DILANTIN) 100 MG ER capsule Take 200 mg by mouth at bedtime.      [name] VIMPAT 100 MG TABS take 2 tablet by mouth twice a day  120 tablet  0   No facility-administered medications prior to visit.    PAST MEDICAL HISTORY: Past Medical History  Diagnosis Date  . Hypertension   . Seizures     due head trauma-seizures are less, but occurs from mild to more severe, which causes agitation to mental state.  . Language barrier 03-01-13    Speaks very little English,primary-Vietnamese "[name] Dialect"  . Depression   . Anxiety     PAST SURGICAL HISTORY: Past Surgical History  Procedure Laterality Date  . No past surgeries      ?  one surgery in [place]-? what, has abdominal scar.    FAMILY HISTORY: History reviewed. No pertinent family history.  SOCIAL HISTORY: History   Social History  . Marital Status: Single    Spouse Name: N/A    Number of Children: N/A  . Years of Education: 4   Occupational History  .     Social History Main Topics  . Smoking status: Former Smoker    Quit date: 03/02/1989  . Smokeless tobacco: Never Used  . Alcohol Use: No  . Drug Use: No  . Sexual Activity: Not Currently   Other Topics Concern  . Not on file   Social History Narrative    ** Merged History Encounter **    Patient is single.   Patient is disabled.   Patient has a 4th grade education.   Patient drinks two cups of coffee daily.     PHYSICAL EXAM  Filed Vitals:   03/13/13 0839  BP: 120/75  Pulse: 81  Height: 5\' 6"  (1.676 m)  Weight: 195 lb (88.451 kg)   Body mass index is 31.49 kg/(m^2).  Generalized: Well developed, in no acute distress  Head: normocephalic and atraumatic,. Oropharynx benign  Neck: Supple, no carotid bruits  Cardiac: Regular rate rhythm, no murmur  Musculoskeletal: No deformity   Neurological examination   Mentation: Alert oriented to time, place, history taking. Follows all commands , does not speak or understand [place].   Cranial nerve II-XII: Pupils were equal round reactive to light extraocular movements were full, visual field were full on confrontational test. Left exotropia.  Facial sensation and strength were normal. hearing was intact to finger rubbing bilaterally. Uvula tongue midline. head turning and shoulder shrug were normal and symmetric.Tongue protrusion into cheek strength was normal. Motor: normal bulk and tone, full strength in the BUE, BLE,  No focal weakness Sensory: normal and symmetric to light touch, pinprick, and  vibration , withdraws to pain Coordination: finger-nose-finger, heel-to-shin bilaterally, no dysmetria Reflexes: 1+ upper lower and symmetric  Gait and Station: Rising up from seated position without assistance, normal stance,  moderate stride, good arm swing, smooth turning, no assistive device DIAGNOSTIC DATA (LABS, IMAGING, TESTING) - I reviewed patient records, labs, notes, testing and imaging myself where available.  Lab Results  Component Value Date   WBC 7.8 08/05/2012   HGB 13.6 08/05/2012   HCT 40.1 08/05/2012   MCV 90.1 08/05/2012   PLT 364 08/05/2012      Component Value Date/Time   NA 138 08/05/2012 0450   K 4.1 08/05/2012 0450   CL 101 08/05/2012 0450   CO2 30 08/05/2012 0450   GLUCOSE  110* 08/05/2012 0450   BUN 14 08/05/2012 0450   CREATININE 1.11 08/05/2012 0450   CALCIUM 9.5 08/05/2012 0450   PROT 8.3 08/04/2012 1430   ALBUMIN 3.9 08/04/2012 1430   AST 23 08/04/2012 1430   ALT 16 08/04/2012 1430   ALKPHOS 80 08/04/2012 1430   BILITOT 0.2* 08/04/2012 1430   GFRNONAA 74* 08/05/2012 0450   GFRAA 85* 08/05/2012 0450     Lab Results  Component Value Date   VITAMINB12 289 08/05/2012   Lab Results  Component Value Date   TSH 0.119* 08/05/2012    ASSESSMENT AND PLAN  53 y.o. year old male  has a past medical history of Hypertension; Seizures; Language barrier (03-01-13); Depression; and Anxiety. here to followup for seizure disorder. Last Dilantin level 08/05/12 was 9.3. Patient has intermittently been taking his Vimpat as his insurance  does not cover the medication and he was out of it for over 2 weeks. He has continued to have seizures off and on.  Will check labs tomorrow, pt has already taken seizure meds today. Continue current meds Check about patient assistance for Vimpat.  F/U in 3 months Dennie Bible, [facility], [facility], APRN  [facility] [address], [place], [place] [address] [phone]

## 2013-03-13 NOTE — Patient Instructions (Signed)
Will check labs today Continue current meds F/U in 3 months

## 2013-03-17 ENCOUNTER — Encounter (HOSPITAL_COMMUNITY): Payer: Medicare Other | Admitting: Anesthesiology

## 2013-03-17 ENCOUNTER — Ambulatory Visit (HOSPITAL_COMMUNITY): Payer: Medicare Other | Admitting: Anesthesiology

## 2013-03-17 ENCOUNTER — Ambulatory Visit (HOSPITAL_COMMUNITY)
Admission: RE | Admit: 2013-03-17 | Discharge: 2013-03-17 | Disposition: A | Discharge status: Home / Self Care | Payer: Medicare Other | Source: Ambulatory Visit | Attending: Gastroenterology | Admitting: Gastroenterology

## 2013-03-17 ENCOUNTER — Encounter (HOSPITAL_COMMUNITY)
Admission: RE | Disposition: A | Discharge status: Home / Self Care | Payer: Self-pay | Source: Ambulatory Visit | Attending: Gastroenterology

## 2013-03-17 ENCOUNTER — Encounter (HOSPITAL_COMMUNITY): Payer: Self-pay

## 2013-03-17 DIAGNOSIS — K649 Unspecified hemorrhoids: Secondary | ICD-10-CM | POA: Diagnosis not present

## 2013-03-17 DIAGNOSIS — Z1211 Encounter for screening for malignant neoplasm of colon: Secondary | ICD-10-CM | POA: Diagnosis not present

## 2013-03-17 DIAGNOSIS — Z87891 Personal history of nicotine dependence: Secondary | ICD-10-CM | POA: Insufficient documentation

## 2013-03-17 DIAGNOSIS — I10 Essential (primary) hypertension: Secondary | ICD-10-CM | POA: Diagnosis not present

## 2013-03-17 DIAGNOSIS — D126 Benign neoplasm of colon, unspecified: Secondary | ICD-10-CM | POA: Insufficient documentation

## 2013-03-17 DIAGNOSIS — R569 Unspecified convulsions: Secondary | ICD-10-CM | POA: Diagnosis not present

## 2013-03-17 HISTORY — PX: COLONOSCOPY: SHX5424

## 2013-03-17 HISTORY — DX: Other specified health status: Z78.9

## 2013-03-17 SURGERY — COLONOSCOPY
Anesthesia: Monitor Anesthesia Care

## 2013-03-17 MED ORDER — PROPOFOL INFUSION 10 MG/ML OPTIME
INTRAVENOUS | Status: DC | PRN
  Administered 2013-03-17: 300 ug/kg/min via INTRAVENOUS

## 2013-03-17 MED ORDER — PROPOFOL 10 MG/ML IV BOLUS
INTRAVENOUS | Status: AC
  Filled 2013-03-17: qty 20

## 2013-03-17 MED ORDER — LACTATED RINGERS IV SOLN
INTRAVENOUS | Status: DC
Start: 2013-03-17 — End: 2013-03-17
  Administered 2013-03-17: 1000 mL via INTRAVENOUS

## 2013-03-17 MED ORDER — SODIUM CHLORIDE 0.9 % IV SOLN: INTRAVENOUS | Status: DC

## 2013-03-17 NOTE — H&P (Signed)
   [name] HPI: This is a 53 year old male referred for a screenign colonoscopy.  No issues with nausea, vomiting, fevers, chills, abdominal pain, diarrhea, constipation, hematochezia, melena, GERD, or dysphagia.  No issues with chest pain, SOB, or MI, however, he has a history of seizures.  His last seizure was a month ago.  Past Medical History  Diagnosis Date  . Hypertension   . Seizures     due head trauma-seizures are less, but occurs from mild to more severe, which causes agitation to mental state.  . Language barrier 03-01-13    Speaks very little English,primary-Vietnamese "[name] Dialect"  . Depression   . Anxiety     Past Surgical History  Procedure Laterality Date  . No past surgeries      ? one surgery in [place]-? what, has abdominal scar.    History reviewed. No pertinent family history.  Social History:  reports that he quit smoking about 24 years ago. He has never used smokeless tobacco. He reports that he does not drink alcohol or use illicit drugs.  Allergies: No Known Allergies  Medications: Scheduled: Continuous:  No results found for this or any previous visit (from the past 24 hour(s)).   No results found.  ROS:  As stated above in the HPI otherwise negative.  There were no vitals taken for this visit.    PE: Gen: NAD, Alert and Oriented HEENT:  [name]/AT, EOMI Neck: Supple, no LAD Lungs: CTA Bilaterally CV: RRR without M/G/R ABM: Soft, NTND, +BS Ext: No C/C/E  Assessment/Plan: 1) Screening colonoscopy. 2) Seizure disorder.  Plan: 1) Colonoscopy.  Mallie Linnemann D 03/17/2013, 10:04 AM

## 2013-03-17 NOTE — Discharge Instructions (Signed)
Colonoscopy, Care After °Refer to this sheet in the next few weeks. These instructions provide you with information on caring for yourself after your procedure. Your health care provider may also give you more specific instructions. Your treatment has been planned according to current medical practices, but problems sometimes occur. Call your health care provider if you have any problems or questions after your procedure. °WHAT TO EXPECT AFTER THE PROCEDURE  °After your procedure, it is typical to have the following: °· A small amount of blood in your stool. °· Moderate amounts of gas and mild abdominal cramping or bloating. °HOME CARE INSTRUCTIONS °· Do not drive, operate machinery, or sign important documents for 24 hours. °· You may shower and resume your regular physical activities, but move at a slower pace for the first 24 hours. °· Take frequent rest periods for the first 24 hours. °· Walk around or put a warm pack on your abdomen to help reduce abdominal cramping and bloating. °· Drink enough fluids to keep your urine clear or pale yellow. °· You may resume your normal diet as instructed by your health care provider. Avoid heavy or fried foods that are hard to digest. °· Avoid drinking alcohol for 24 hours or as instructed by your health care provider. °· Only take over-the-counter or prescription medicines as directed by your health care provider. °· If a tissue sample (biopsy) was taken during your procedure: °¨ Do not take aspirin or blood thinners for 7 days, or as instructed by your health care provider. °¨ Do not drink alcohol for 7 days, or as instructed by your health care provider. °¨ Eat soft foods for the first 24 hours. °SEEK MEDICAL CARE IF: °You have persistent spotting of blood in your stool 2-3 days after the procedure. °SEEK IMMEDIATE MEDICAL CARE IF: °· You have more than a small spotting of blood in your stool. °· You pass large blood clots in your stool. °· Your abdomen is swollen  (distended). °· You have nausea or vomiting. °· You have a fever. °· You have increasing abdominal pain that is not relieved with medicine. ° ° °Colon Polyps °Polyps are lumps of extra tissue growing inside the body. Polyps can grow in the large intestine (colon). Most colon polyps are noncancerous (benign). However, some colon polyps can become cancerous over time. Polyps that are larger than a pea may be harmful. To be safe, caregivers remove and test all polyps. °CAUSES  °Polyps form when mutations in the genes cause your cells to grow and divide even though no more tissue is needed. °RISK FACTORS °There are a number of risk factors that can increase your chances of getting colon polyps. They include: °· Being older than 50 years. °· Family history of colon polyps or colon cancer. °· Long-term colon diseases, such as colitis or Crohn disease. °· Being overweight. °· Smoking. °· Being inactive. °· Drinking too much alcohol. °SYMPTOMS  °Most small polyps do not cause symptoms. If symptoms are present, they may include: °· Blood in the stool. The stool may look dark red or black. °· Constipation or diarrhea that lasts longer than 1 week. °DIAGNOSIS °People often do not know they have polyps until their caregiver finds them during a regular checkup. Your caregiver can use 4 tests to check for polyps: °· Digital rectal exam. The caregiver wears gloves and feels inside the rectum. This test would find polyps only in the rectum. °· Barium enema. The caregiver puts a liquid called barium into your   rectum before taking X-rays of your colon. Barium makes your colon look white. Polyps are dark, so they are easy to see in the X-ray pictures. °· Sigmoidoscopy. A thin, flexible tube (sigmoidoscope) is placed into your rectum. The sigmoidoscope has a light and tiny camera in it. The caregiver uses the sigmoidoscope to look at the last third of your colon. °· Colonoscopy. This test is like sigmoidoscopy, but the caregiver looks  at the entire colon. This is the most common method for finding and removing polyps. °TREATMENT  °Any polyps will be removed during a sigmoidoscopy or colonoscopy. The polyps are then tested for cancer. °PREVENTION  °To help lower your risk of getting more colon polyps: °· Eat plenty of fruits and vegetables. Avoid eating fatty foods. °· Do not smoke. °· Avoid drinking alcohol. °· Exercise every day. °· Lose weight if recommended by your caregiver. °· Eat plenty of calcium and folate. Foods that are rich in calcium include milk, cheese, and broccoli. Foods that are rich in folate include chickpeas, kidney beans, and spinach. °HOME CARE INSTRUCTIONS °Keep all follow-up appointments as directed by your caregiver. You may need periodic exams to check for polyps. °SEEK MEDICAL CARE IF: °You notice bleeding during a bowel movement. ° °

## 2013-03-17 NOTE — Transfer of Care (Signed)
Immediate Anesthesia Transfer of Care Note  Patient: [name]  Procedure(s) Performed: Procedure(s): COLONOSCOPY (N/A)  Patient Location: PACU  Anesthesia Type:MAC  Level of Consciousness: sedated  Airway & Oxygen Therapy: Patient Spontanous Breathing and Patient connected to nasal cannula oxygen  Post-op Assessment: Report given to PACU RN and Post -op Vital signs reviewed and stable  Post vital signs: stable  Complications: No apparent anesthesia complications

## 2013-03-17 NOTE — Anesthesia Postprocedure Evaluation (Signed)
  Anesthesia Post-op Note  Patient: [name]  Procedure(s) Performed: Procedure(s) (LRB): COLONOSCOPY (N/A)  Patient Location: PACU  Anesthesia Type: MAC  Level of Consciousness: awake and alert   Airway and Oxygen Therapy: Patient Spontanous Breathing  Post-op Pain: mild  Post-op Assessment: Post-op Vital signs reviewed, Patient's Cardiovascular Status Stable, Respiratory Function Stable, Patent Airway and No signs of Nausea or vomiting  Last Vitals:  Filed Vitals:   03/17/13 1200  BP: 118/66  Pulse:   Temp:   Resp: 13    Post-op Vital Signs: stable   Complications: No apparent anesthesia complications

## 2013-03-17 NOTE — Preoperative (Signed)
Beta Blockers   Reason not to administer Beta Blockers:Not Applicable 

## 2013-03-17 NOTE — Anesthesia Preprocedure Evaluation (Addendum)
Anesthesia Evaluation  Patient identified by MRN, date of birth, ID band Patient awake    Reviewed: Allergy & Precautions, H&P , NPO status , Patient's Chart, lab work & pertinent test results  Airway Mallampati: II TM Distance: >3 FB Neck ROM: Full    Dental no notable dental hx.    Pulmonary neg pulmonary ROS, former smoker,  breath sounds clear to auscultation  Pulmonary exam normal       Cardiovascular hypertension, Rhythm:Regular Rate:Normal     Neuro/Psych Seizures -, Poorly Controlled,  negative psych ROS   GI/Hepatic negative GI ROS, Neg liver ROS,   Endo/Other  negative endocrine ROS  Renal/GU negative Renal ROS  negative genitourinary   Musculoskeletal negative musculoskeletal ROS (+)   Abdominal   Peds negative pediatric ROS (+)  Hematology negative hematology ROS (+)   Anesthesia Other Findings   Reproductive/Obstetrics negative OB ROS                          Anesthesia Physical Anesthesia Plan  ASA: III  Anesthesia Plan: MAC   Post-op Pain Management:    Induction:   Airway Management Planned: Simple Face Mask  Additional Equipment:   Intra-op Plan:   Post-operative Plan:   Informed Consent: I have reviewed the patients History and Physical, chart, labs and discussed the procedure including the risks, benefits and alternatives for the proposed anesthesia with the patient or authorized representative who has indicated his/her understanding and acceptance.   Dental advisory given  Plan Discussed with:   Anesthesia Plan Comments:         Anesthesia Quick Evaluation

## 2013-03-17 NOTE — Op Note (Signed)
[facility] [place], [id number]   OPERATIVE PROCEDURE REPORT  PATIENT: [name], [name]  MR#: [id number] BIRTHDATE: 1960/07/27  GENDER: Male ENDOSCOPIST: Carol Ada, MD ASSISTANT:   Estelle June, RN Corliss Parish, technician PROCEDURE DATE: 03/17/2013 PROCEDURE:   Colonoscopy with snare polypectomy ASA CLASS:   Class III INDICATIONS:Colorectal cancer screening. MEDICATIONS: MAC sedation, administered by CRNA  DESCRIPTION OF PROCEDURE:   After the risks benefits and alternatives of the procedure were thoroughly explained, informed consent was obtained.  A digital rectal exam revealed no abnormalities of the rectum.    The Pentax Adult Colonscope [id number] endoscope was introduced through the anus  and advanced to the terminal ileum which was intubated for a short distance , No adverse events experienced.    The quality of the prep was excellent. .  The instrument was then slowly withdrawn as the colon was fully examined.     FINDINGS: Three ascending colon polyps measuring 3-5 mm were removed with a cold snare.  A 3 mm sessile descending colon polyp was removed with a cold snare.  No evidence of any mass, inflammation, ulcerations, erosions, or vascular abnormalities.   Retroflexed views revealed internal/external hemorrhoids.     The scope was then withdrawn from the patient and the procedure terminated.  COMPLICATIONS: There were no complications.  IMPRESSION: 1) Polyps. 2) Int/Ext hemorrhoids.  RECOMMENDATIONS: 1) Await biopsy results. 2) Repeat the colonoscopy in 3-5 years.  _______________________________ eSignedCarol Ada, MD 03/17/2013 11:37 AM

## 2013-03-20 ENCOUNTER — Encounter (HOSPITAL_COMMUNITY): Payer: Self-pay | Admitting: Gastroenterology

## 2013-03-21 ENCOUNTER — Telehealth: Payer: Self-pay | Admitting: Nurse Practitioner

## 2013-03-21 NOTE — Telephone Encounter (Signed)
Spoke with patient and he will have son contact office, hard to understand

## 2013-03-21 NOTE — Telephone Encounter (Signed)
Patient did not get labs drawn that were ordered on 03/13/2013 visit. Please call to remind

## 2013-03-21 NOTE — Telephone Encounter (Signed)
Called and left VM message to remind patient of labs needing to be drawn.

## 2013-03-29 ENCOUNTER — Other Ambulatory Visit (INDEPENDENT_AMBULATORY_CARE_PROVIDER_SITE_OTHER): Payer: Medicare Other

## 2013-03-29 DIAGNOSIS — Z0289 Encounter for other administrative examinations: Secondary | ICD-10-CM

## 2013-03-29 DIAGNOSIS — G40319 Generalized idiopathic epilepsy and epileptic syndromes, intractable, without status epilepticus: Secondary | ICD-10-CM | POA: Diagnosis not present

## 2013-03-29 DIAGNOSIS — Z79899 Other long term (current) drug therapy: Secondary | ICD-10-CM | POA: Diagnosis not present

## 2013-03-29 DIAGNOSIS — G40219 Localization-related (focal) (partial) symptomatic epilepsy and epileptic syndromes with complex partial seizures, intractable, without status epilepticus: Secondary | ICD-10-CM | POA: Diagnosis not present

## 2013-03-30 ENCOUNTER — Other Ambulatory Visit: Payer: Self-pay

## 2013-03-30 MED ORDER — LACOSAMIDE 100 MG PO TABS: 200.0000 mg | ORAL_TABLET | Freq: Two times a day (BID) | ORAL | Status: DC

## 2013-03-31 LAB — COMPREHENSIVE METABOLIC PANEL
ALBUMIN: 4.8 g/dL (ref 3.5–5.5)
ALK PHOS: 92 IU/L (ref 39–117)
ALT: 18 IU/L (ref 0–44)
AST: 22 IU/L (ref 0–40)
Albumin/Globulin Ratio: 1.3 (ref 1.1–2.5)
BUN/Creatinine Ratio: 10 (ref 9–20)
BUN: 12 mg/dL (ref 6–24)
CALCIUM: 10.2 mg/dL (ref 8.7–10.2)
CHLORIDE: 96 mmol/L — AB (ref 97–108)
CO2: 23 mmol/L (ref 18–29)
Creatinine, Ser: 1.22 mg/dL (ref 0.76–1.27)
GFR calc Af Amer: 77 mL/min/{1.73_m2} (ref >59)
GFR calc non Af Amer: 67 mL/min/{1.73_m2} (ref >59)
GLUCOSE: 111 mg/dL — AB (ref 65–99)
Globulin, Total: 3.7 g/dL (ref 1.5–4.5)
Potassium: 4.3 mmol/L (ref 3.5–5.2)
Sodium: 144 mmol/L (ref 134–144)
TOTAL PROTEIN: 8.5 g/dL (ref 6.0–8.5)
Total Bilirubin: 0.2 mg/dL (ref 0.0–1.2)

## 2013-03-31 LAB — CBC WITH DIFFERENTIAL/PLATELET
Basophils Absolute: 0 10*3/uL (ref 0.0–0.2)
Basos: 0 %
EOS: 6 %
Eosinophils Absolute: 0.3 10*3/uL (ref 0.0–0.4)
HEMATOCRIT: 44.7 % (ref 37.5–51.0)
Hemoglobin: 15.5 g/dL (ref 12.6–17.7)
IMMATURE GRANULOCYTES: 0 %
Immature Grans (Abs): 0 10*3/uL (ref 0.0–0.1)
LYMPHS: 31 %
Lymphocytes Absolute: 1.5 10*3/uL (ref 0.7–3.1)
MCH: 31.3 pg (ref 26.6–33.0)
MCHC: 34.7 g/dL (ref 31.5–35.7)
MCV: 90 fL (ref 79–97)
MONOCYTES: 7 %
Monocytes Absolute: 0.4 10*3/uL (ref 0.1–0.9)
Neutrophils Absolute: 2.7 10*3/uL (ref 1.4–7.0)
Neutrophils Relative %: 56 %
RBC: 4.96 x10E6/uL (ref 4.14–5.80)
RDW: 14.1 % (ref 12.3–15.4)
WBC: 4.9 10*3/uL (ref 3.4–10.8)

## 2013-03-31 LAB — PHENYTOIN LEVEL, TOTAL: Phenytoin Lvl: 14 ug/mL (ref 10.0–20.0)

## 2013-03-31 LAB — LEVETIRACETAM LEVEL: LEVETIRACETAM: 27 ug/mL (ref 5.0–63.0)

## 2013-04-04 ENCOUNTER — Telehealth: Payer: Self-pay | Admitting: Neurology

## 2013-04-04 NOTE — Telephone Encounter (Signed)
Rx was previously sent to the pharmacy.  I called [facility].  Spoke with Ronalee Belts.  He said they do have the Rx on file and will fill it.  They will contact the patient when it is ready for pick up. I called Mr [place] back.  He said they were not asking about Vimpat, they wanted Dilantin.  (Rx was already sent for this as well).  He said he spoke with the pharmacist and they told him they do have refills and it will be ready at 4pm today.

## 2013-04-04 NOTE — Telephone Encounter (Signed)
Cornelia Copa calling to state that patient needs Vimpat refills.

## 2013-06-28 ENCOUNTER — Ambulatory Visit: Payer: Medicare Other | Admitting: Nurse Practitioner

## 2013-09-13 ENCOUNTER — Telehealth: Payer: Self-pay | Admitting: Nurse Practitioner

## 2013-09-13 ENCOUNTER — Ambulatory Visit: Payer: Medicare Other | Admitting: Nurse Practitioner

## 2013-09-13 NOTE — Telephone Encounter (Signed)
No show for scheduled appts

## 2013-10-15 ENCOUNTER — Other Ambulatory Visit: Payer: Self-pay | Admitting: Nurse Practitioner

## 2013-10-20 ENCOUNTER — Other Ambulatory Visit: Payer: Self-pay | Admitting: Nurse Practitioner

## 2013-10-25 DIAGNOSIS — G40909 Epilepsy, unspecified, not intractable, without status epilepticus: Secondary | ICD-10-CM | POA: Diagnosis not present

## 2013-10-25 DIAGNOSIS — I10 Essential (primary) hypertension: Secondary | ICD-10-CM | POA: Diagnosis not present

## 2013-10-25 DIAGNOSIS — R609 Edema, unspecified: Secondary | ICD-10-CM | POA: Diagnosis not present

## 2014-01-03 ENCOUNTER — Ambulatory Visit (INDEPENDENT_AMBULATORY_CARE_PROVIDER_SITE_OTHER): Payer: Commercial Managed Care - HMO | Admitting: Nurse Practitioner

## 2014-01-03 ENCOUNTER — Encounter: Payer: Self-pay | Admitting: Nurse Practitioner

## 2014-01-03 VITALS — BP 96/59 | HR 98 | Ht 66.0 in | Wt 187.2 lb

## 2014-01-03 DIAGNOSIS — Z5181 Encounter for therapeutic drug level monitoring: Secondary | ICD-10-CM

## 2014-01-03 DIAGNOSIS — G40319 Generalized idiopathic epilepsy and epileptic syndromes, intractable, without status epilepticus: Secondary | ICD-10-CM

## 2014-01-03 DIAGNOSIS — G40311 Generalized idiopathic epilepsy and epileptic syndromes, intractable, with status epilepticus: Secondary | ICD-10-CM

## 2014-01-03 MED ORDER — PHENYTOIN SODIUM EXTENDED 100 MG PO CAPS: 200.0000 mg | ORAL_CAPSULE | Freq: Two times a day (BID) | ORAL | Status: DC

## 2014-01-03 MED ORDER — LEVETIRACETAM 1000 MG PO TABS: ORAL_TABLET | ORAL | Status: DC

## 2014-01-03 NOTE — Progress Notes (Signed)
[facility]  PATIENT: [name] DOB: 1961/01/26   REASON FOR VISIT: follow-up for seizure disorder   HISTORY OF PRESENT ILLNESS:[name], 53 year old male returns for followup. He does not speak [place]. He is accompanied by his 24/ 7 caregiver. He has a seizure disorder and is currently on Dilantin 400 mg daily, Keppra 1500 mg twice daily, he has been unable to afford Vimpat 200 twice daily. Patient's last Dilantin level was 14.0 and his Keppra level was 27.0 in January 2015.  His last seizure activity was approximately 3 months ago when he missed a few doses of his Dilantin. Appetite is reportedly good, sleeps well at night. No falls, no balance issues. No recent labs. He returns for reevaluation    HISTORY: He does not speak [place] and understands very little spoken [place]. He has a caregiver with him today who intreprets. By history he has mild cerebral palsy and a possible head injury in childhood. He has hx of gout and HTN. He has a 4th grade education and lives with his caregiver.  His brother says his seizures have been in good control. He continues to have problems with short term memory and learning new information. He has no problems with day to day functioning. He is independent with ADL's. He had an admission to inpatient Behavorial [facility] in January and June for uncontrolled anger.   He has been tapered off phenobarbital, he is not taking Dilantin 100 mg 2 tablets twice a day, levetiracetam 1000mg  1 and a half tablets twice a day. He continues to have seizures, the longest stretch in between is 3 weeks  REVIEW OF SYSTEMS: Full 14 system review of systems performed and notable only for those listed, all others are neg:  Constitutional: N/A  Cardiovascular: N/A  Ear/Nose/Throat: N/A  Skin: N/A  Eyes: N/A  Respiratory: N/A  Gastroitestinal: N/A  Hematology/Lymphatic: N/A  Endocrine: N/A Musculoskeletal:joint pain Allergy/Immunology: N/A    Neurological: memory loss, seizure Psychiatric: N/A Sleep : NA   ALLERGIES: No Known Allergies  HOME MEDICATIONS: Outpatient Prescriptions Prior to Visit  Medication Sig Dispense Refill  . acetaminophen (TYLENOL) 325 MG tablet Take 650 mg by mouth every 6 (six) hours as needed for mild pain or moderate pain.    [name] levETIRAcetam (KEPPRA) 1000 MG tablet TAKE 1 AND 1/2 TABLETS BY MOUTH TWICE DAILY 30 tablet 0  . lisinopril (PRINIVIL,ZESTRIL) 10 MG tablet Take 10 mg by mouth daily with breakfast.     . phenytoin (DILANTIN) 100 MG ER capsule Take 2 capsules (200 mg total) by mouth 2 (two) times daily. 120 capsule 6  . Lacosamide (VIMPAT) 100 MG TABS Take 2 tablets (200 mg total) by mouth 2 (two) times daily. 360 tablet 1   No facility-administered medications prior to visit.    PAST MEDICAL HISTORY: Past Medical History  Diagnosis Date  . Hypertension   . Seizures     due head trauma-seizures are less, but occurs from mild to more severe, which causes agitation to mental state.  . Language barrier 03-01-13    Speaks very little English,primary-Vietnamese "[name] Dialect"  . Depression   . Anxiety     PAST SURGICAL HISTORY: Past Surgical History  Procedure Laterality Date  . No past surgeries      ? one surgery in [place]-? what, has abdominal scar.  . Colonoscopy N/A 03/17/2013    Procedure: COLONOSCOPY;  Surgeon: [name], MD;  Location: [facility];  Service: Endoscopy;  Laterality: N/A;  FAMILY HISTORY: History reviewed. No pertinent family history.  SOCIAL HISTORY: History   Social History  . Marital Status: Single    Spouse Name: N/A    Number of Children: N/A  . Years of Education: 4   Occupational History  .     Social History Main Topics  . Smoking status: Former Smoker    Quit date: 03/02/1989  . Smokeless tobacco: Never Used  . Alcohol Use: No  . Drug Use: No  . Sexual Activity: Not Currently   Other Topics Concern  . Not on file    Social History Narrative   ** Merged History Encounter **    Patient is single.   Patient is disabled.   Patient has a 4th grade education.   Patient drinks two cups of coffee daily.     PHYSICAL EXAM  Filed Vitals:   01/03/14 1457  BP: 96/59  Pulse: 98  Height: 5\' 6"  (1.676 m)  Weight: 187 lb 3.2 oz (84.913 kg)   Body mass index is 30.23 kg/(m^2). Generalized: Well developed, in no acute distress  Head: normocephalic and atraumatic,. Oropharynx benign  Neck: Supple, no carotid bruits  Cardiac: Regular rate rhythm, no murmur  Musculoskeletal: No deformity   Neurological examination   Mentation: Alert oriented to time, place, history taking. Follows all commands , does not speak or understand [place].   Cranial nerve II-XII: Pupils were equal round reactive to light extraocular movements were full, visual field were full on confrontational test. Left exotropia. Facial sensation and strength were normal. hearing was intact to finger rubbing bilaterally. Uvula tongue midline. head turning and shoulder shrug were normal and symmetric.Tongue protrusion into cheek strength was normal. Motor: normal bulk and tone, full strength in the BUE, BLE, No focal weakness Sensory: normal and symmetric to light touch, pinprick, and vibration , withdraws to pain Coordination: finger-nose-finger, heel-to-shin bilaterally, no dysmetria Reflexes: 1+ upper lower and symmetric  Gait and Station: Rising up from seated position without assistance, normal stance, moderate stride, good arm swing, smooth turning, no assistive device DIAGNOSTIC DATA (LABS, IMAGING, TESTING) - I reviewed patient records, labs, notes, testing and imaging myself where available.  Lab Results  Component Value Date   WBC 4.9 03/29/2013   HGB 15.5 03/29/2013   HCT 44.7 03/29/2013   MCV 90 03/29/2013   PLT 364 08/05/2012      Component Value Date/Time   NA 144 03/29/2013 1028   NA 138 08/05/2012 0450   K 4.3  03/29/2013 1028   CL 96* 03/29/2013 1028   CO2 23 03/29/2013 1028   GLUCOSE 111* 03/29/2013 1028   GLUCOSE 110* 08/05/2012 0450   BUN 12 03/29/2013 1028   BUN 14 08/05/2012 0450   CREATININE 1.22 03/29/2013 1028   CALCIUM 10.2 03/29/2013 1028   PROT 8.5 03/29/2013 1028   PROT 8.3 08/04/2012 1430   ALBUMIN 3.9 08/04/2012 1430   AST 22 03/29/2013 1028   ALT 18 03/29/2013 1028   ALKPHOS 92 03/29/2013 1028   BILITOT 0.2 03/29/2013 1028   GFRNONAA 67 03/29/2013 1028   GFRAA 77 03/29/2013 1028    ASSESSMENT AND PLAN  53 y.o. year old male  has a past medical history of Hypertension; Seizures; Language barrier (03-01-13); Depression; and Anxiety. here to follow-up for seizure disorder.  Will check labs, CBC, CMP and Dilantin level RX for both Dilantin and Keppra printed off for caregiver per request F/U yearly and prn Call for seizure activity Dennie Bible, GNP, [facility],  APRN  [facility] [address] [place], [place] [address] [phone]

## 2014-01-03 NOTE — Patient Instructions (Signed)
Will check labs RX for both Dilantin and Keppra printed off F/U yearly and prn Call for seizure activity

## 2014-01-04 LAB — CBC WITH DIFFERENTIAL/PLATELET
Basophils Absolute: 0 10*3/uL (ref 0.0–0.2)
Basos: 0 %
EOS ABS: 0.3 10*3/uL (ref 0.0–0.4)
Eos: 3 %
HCT: 42.8 % (ref 37.5–51.0)
Hemoglobin: 14.8 g/dL (ref 12.6–17.7)
IMMATURE GRANS (ABS): 0 10*3/uL (ref 0.0–0.1)
Immature Granulocytes: 0 %
LYMPHS: 20 %
Lymphocytes Absolute: 2.1 10*3/uL (ref 0.7–3.1)
MCH: 30.8 pg (ref 26.6–33.0)
MCHC: 34.6 g/dL (ref 31.5–35.7)
MCV: 89 fL (ref 79–97)
Monocytes Absolute: 1 10*3/uL — ABNORMAL HIGH (ref 0.1–0.9)
Monocytes: 10 %
NEUTROS PCT: 67 %
Neutrophils Absolute: 6.9 10*3/uL (ref 1.4–7.0)
RBC: 4.8 x10E6/uL (ref 4.14–5.80)
RDW: 13 % (ref 12.3–15.4)
WBC: 10.3 10*3/uL (ref 3.4–10.8)

## 2014-01-04 LAB — COMPREHENSIVE METABOLIC PANEL
ALT: 16 IU/L (ref 0–44)
AST: 22 IU/L (ref 0–40)
Albumin/Globulin Ratio: 1.2 (ref 1.1–2.5)
Albumin: 4.3 g/dL (ref 3.5–5.5)
Alkaline Phosphatase: 90 IU/L (ref 39–117)
BUN/Creatinine Ratio: 16 (ref 9–20)
BUN: 21 mg/dL (ref 6–24)
CALCIUM: 10 mg/dL (ref 8.7–10.2)
CHLORIDE: 97 mmol/L (ref 97–108)
CO2: 26 mmol/L (ref 18–29)
Creatinine, Ser: 1.32 mg/dL — ABNORMAL HIGH (ref 0.76–1.27)
GFR calc Af Amer: 70 mL/min/{1.73_m2} (ref >59)
GFR calc non Af Amer: 60 mL/min/{1.73_m2} (ref >59)
GLUCOSE: 77 mg/dL (ref 65–99)
Globulin, Total: 3.6 g/dL (ref 1.5–4.5)
POTASSIUM: 5 mmol/L (ref 3.5–5.2)
Sodium: 140 mmol/L (ref 134–144)
Total Bilirubin: 0.2 mg/dL (ref 0.0–1.2)
Total Protein: 7.9 g/dL (ref 6.0–8.5)

## 2014-01-04 LAB — PHENYTOIN LEVEL, TOTAL: PHENYTOIN LVL: 25.5 ug/mL — AB (ref 10.0–20.0)

## 2014-01-09 NOTE — Progress Notes (Signed)
I agree above plan. 

## 2014-02-17 ENCOUNTER — Other Ambulatory Visit: Payer: Self-pay

## 2014-02-17 MED ORDER — LEVETIRACETAM 1000 MG PO TABS: ORAL_TABLET | ORAL | Status: DC

## 2014-02-17 MED ORDER — PHENYTOIN SODIUM EXTENDED 100 MG PO CAPS: 200.0000 mg | ORAL_CAPSULE | Freq: Two times a day (BID) | ORAL | Status: DC

## 2014-10-06 ENCOUNTER — Encounter (HOSPITAL_COMMUNITY): Payer: Self-pay

## 2014-10-06 ENCOUNTER — Emergency Department (HOSPITAL_COMMUNITY): Payer: Medicare Other

## 2014-10-06 ENCOUNTER — Emergency Department (HOSPITAL_COMMUNITY)
Admission: EM | Admit: 2014-10-06 | Discharge: 2014-10-06 | Disposition: A | Discharge status: Home / Self Care | Payer: Medicare Other | Attending: Emergency Medicine | Admitting: Emergency Medicine

## 2014-10-06 DIAGNOSIS — G40909 Epilepsy, unspecified, not intractable, without status epilepticus: Secondary | ICD-10-CM | POA: Insufficient documentation

## 2014-10-06 DIAGNOSIS — Z79899 Other long term (current) drug therapy: Secondary | ICD-10-CM | POA: Diagnosis not present

## 2014-10-06 DIAGNOSIS — F419 Anxiety disorder, unspecified: Secondary | ICD-10-CM | POA: Diagnosis not present

## 2014-10-06 DIAGNOSIS — I10 Essential (primary) hypertension: Secondary | ICD-10-CM | POA: Insufficient documentation

## 2014-10-06 DIAGNOSIS — M8448XA Pathological fracture, other site, initial encounter for fracture: Secondary | ICD-10-CM | POA: Insufficient documentation

## 2014-10-06 DIAGNOSIS — Z87891 Personal history of nicotine dependence: Secondary | ICD-10-CM | POA: Insufficient documentation

## 2014-10-06 DIAGNOSIS — IMO0002 Reserved for concepts with insufficient information to code with codable children: Secondary | ICD-10-CM

## 2014-10-06 DIAGNOSIS — F329 Major depressive disorder, single episode, unspecified: Secondary | ICD-10-CM | POA: Insufficient documentation

## 2014-10-06 DIAGNOSIS — M545 Low back pain: Secondary | ICD-10-CM | POA: Diagnosis present

## 2014-10-06 MED ORDER — NAPROXEN 500 MG PO TABS
500.0000 mg | ORAL_TABLET | Freq: Once | ORAL | Status: AC
  Administered 2014-10-06: 500 mg via ORAL
  Filled 2014-10-06: qty 1

## 2014-10-06 MED ORDER — OXYCODONE-ACETAMINOPHEN 5-325 MG PO TABS: 1.0000 | ORAL_TABLET | ORAL | Status: DC | PRN

## 2014-10-06 MED ORDER — METHOCARBAMOL 500 MG PO TABS
500.0000 mg | ORAL_TABLET | Freq: Once | ORAL | Status: AC
  Administered 2014-10-06: 500 mg via ORAL
  Filled 2014-10-06: qty 1

## 2014-10-06 NOTE — Discharge Instructions (Signed)
Take the prescribed medication as directed.  May take motrin as well if needed. May continue applying heat and/or ice to back to help with pain as well. Follow-up with neurosurgery-- call to make appt. Return to the ED for new or worsening symptoms.

## 2014-10-06 NOTE — ED Notes (Signed)
Pt c/o lower back pain x 2 days.  Pain score 3/10.  Denies injury.  Denies GU and GI complaints.  Denies numbness and tingling.    Pt's primary language is [place].

## 2014-10-06 NOTE — ED Provider Notes (Signed)
CSN: [id number]     Arrival date & time 10/06/14  1318 History   First MD Initiated Contact with Patient 10/06/14 1346     Chief Complaint  Patient presents with  . Back Pain     (Consider location/radiation/quality/duration/timing/severity/associated sxs/prior Treatment) Patient is a 54 y.o. male presenting with back pain. The history is provided by the patient and medical records.  Back Pain   This is a 54 y.o. M with hx of HTN, seizure disorder, depression, anxiety, presenting to the ED for low back pain for the past 2 days.  Pain currently 3/10.  No injury, trauma, falls, or heavy lifting.  No hx of back issues.  He states pain localized to middle of low back.  No radiation into extremities.  No numbness, paresthesias or weakness of extremities.  No loss of bowel or bladder control.  Patient has been taking OTC pain medication his friend gave him without relief--- unsure exactly what the medication was.  Past Medical History  Diagnosis Date  . Hypertension   . Seizures     due head trauma-seizures are less, but occurs from mild to more severe, which causes agitation to mental state.  . Language barrier 03-01-13    Speaks very little English,primary-Vietnamese "[name] Dialect"  . Depression   . Anxiety    Past Surgical History  Procedure Laterality Date  . No past surgeries      ? one surgery in [place]-? what, has abdominal scar.  . Colonoscopy N/A 03/17/2013    Procedure: COLONOSCOPY;  Surgeon: [name], MD;  Location: [facility];  Service: Endoscopy;  Laterality: N/A;   History reviewed. No pertinent family history. History  Substance Use Topics  . Smoking status: Former Smoker    Quit date: 03/02/1989  . Smokeless tobacco: Never Used  . Alcohol Use: No    Review of Systems  Musculoskeletal: Positive for back pain.  All other systems reviewed and are negative.     Allergies  Review of patient's allergies indicates no known allergies.  Home Medications    Prior to Admission medications   Medication Sig Start Date End Date Taking? Authorizing Provider  acetaminophen (TYLENOL) 325 MG tablet Take 650 mg by mouth every 6 (six) hours as needed for mild pain or moderate pain. 12/23/11   Domenic Polite, MD  levETIRAcetam (KEPPRA) 1000 MG tablet Take one and one half tabs twice daily 02/17/14   Marcial Pacas, MD  lisinopril (PRINIVIL,ZESTRIL) 10 MG tablet Take 10 mg by mouth daily with breakfast.     Historical Provider, MD  oxyCODONE-acetaminophen (PERCOCET/ROXICET) 5-325 MG per tablet Take 1 tablet by mouth every 4 (four) hours as needed. 10/06/14   Larene Pickett, PA-C  phenytoin (DILANTIN) 100 MG ER capsule Take 2 capsules (200 mg total) by mouth 2 (two) times daily. 02/17/14   Marcial Pacas, MD   BP 127/69 mmHg  Pulse 82  Temp(Src) 98.1 F (36.7 C) (Oral)  Resp 16  SpO2 95%   Physical Exam  Constitutional: He is oriented to person, place, and time. He appears well-developed and well-nourished. No distress.  HENT:  Head: Normocephalic and atraumatic.  Mouth/Throat: Oropharynx is clear and moist.  Eyes: Conjunctivae and EOM are normal. Pupils are equal, round, and reactive to light.  Neck: Normal range of motion. Neck supple.  Cardiovascular: Normal rate, regular rhythm and normal heart sounds.   Pulmonary/Chest: Effort normal and breath sounds normal. No respiratory distress. He has no wheezes.  Musculoskeletal: Normal range of  motion. He exhibits no edema.       Lumbar back: He exhibits tenderness, bony tenderness and pain.       Back:  Midline tenderness of LS; no step-off or gross deformity noted; full ROM maintained; normal strength and sensation of BLE; normal gait  Neurological: He is alert and oriented to person, place, and time.  Skin: Skin is warm and dry. He is not diaphoretic.  Psychiatric: He has a normal mood and affect.  Nursing note and vitals reviewed.   ED Course  Procedures (including critical care time) Labs Review Labs  Reviewed - No data to display  Imaging Review Dg Lumbar Spine Complete  10/06/2014   CLINICAL DATA:  Lower back pain for 3 days with no known injury.  EXAM: LUMBAR SPINE - COMPLETE 4+ VIEW  COMPARISON:  None.  FINDINGS: There is a wedge-shaped deformity of the T12 vertebrae with mild loss of the anterior vertebral body height. There is mild sclerosis and irregularity along superior endplate with anterior osteophyte formation. This is likely chronic.  All remaining vertebrae and all pt did  There is no spondylolisthesis.  Mild loss disc height from L2-L3 through L4-L5. There are small endplate osteophytes 3 4 L5.  Facet joints are relatively well preserved.  Soft tissues are unremarkable.  IMPRESSION: 1. Mild compression deformity at T12 which appears chronic. 2. No convincing acute fracture. 3. Mild disc degenerative change.   Electronically Signed   By: Lajean Manes M.D.   On: 10/06/2014 14:32     EKG Interpretation None      MDM   Final diagnoses:  Compression fracture   54 y.o. M here with 2 days of atraumatic low back pain.  No hx of back issues.  Patient afebrile, non-toxic.  Midline tenderness noted without deformity.  Normal strength and sensation of BLE, normal gait.  No deficits to suggest cauda equina.  Given no known history, x-ray was obtained revealing T12 compression deformity which appears chronic.  Patient will be d/c home with pain meds.  Given neurosurgery follow-up.  Discussed plan with patient, he/she acknowledged understanding and agreed with plan of care.  Return precautions given for new or worsening symptoms.  Larene Pickett, PA-C 10/06/14 [place], DO 10/06/14 1547

## 2014-10-27 ENCOUNTER — Emergency Department (HOSPITAL_COMMUNITY): Payer: Medicare Other

## 2014-10-27 ENCOUNTER — Emergency Department (HOSPITAL_COMMUNITY)
Admission: EM | Admit: 2014-10-27 | Discharge: 2014-10-27 | Disposition: A | Discharge status: Home / Self Care | Payer: Medicare Other | Attending: Emergency Medicine | Admitting: Emergency Medicine

## 2014-10-27 ENCOUNTER — Encounter (HOSPITAL_COMMUNITY): Payer: Self-pay

## 2014-10-27 DIAGNOSIS — Z79899 Other long term (current) drug therapy: Secondary | ICD-10-CM | POA: Insufficient documentation

## 2014-10-27 DIAGNOSIS — M545 Low back pain: Secondary | ICD-10-CM | POA: Diagnosis not present

## 2014-10-27 DIAGNOSIS — R200 Anesthesia of skin: Secondary | ICD-10-CM | POA: Diagnosis not present

## 2014-10-27 DIAGNOSIS — G40909 Epilepsy, unspecified, not intractable, without status epilepticus: Secondary | ICD-10-CM | POA: Diagnosis not present

## 2014-10-27 DIAGNOSIS — Z791 Long term (current) use of non-steroidal anti-inflammatories (NSAID): Secondary | ICD-10-CM | POA: Insufficient documentation

## 2014-10-27 DIAGNOSIS — Z8781 Personal history of (healed) traumatic fracture: Secondary | ICD-10-CM | POA: Insufficient documentation

## 2014-10-27 DIAGNOSIS — Z8659 Personal history of other mental and behavioral disorders: Secondary | ICD-10-CM | POA: Diagnosis not present

## 2014-10-27 DIAGNOSIS — Z87891 Personal history of nicotine dependence: Secondary | ICD-10-CM | POA: Insufficient documentation

## 2014-10-27 DIAGNOSIS — I10 Essential (primary) hypertension: Secondary | ICD-10-CM | POA: Insufficient documentation

## 2014-10-27 LAB — I-STAT CHEM 8, ED
BUN: 19 mg/dL (ref 6–20)
CALCIUM ION: 1.14 mmol/L (ref 1.12–1.23)
CREATININE: 1 mg/dL (ref 0.61–1.24)
Chloride: 102 mmol/L (ref 101–111)
Glucose, Bld: 95 mg/dL (ref 65–99)
HCT: 47 % (ref 39.0–52.0)
HEMOGLOBIN: 16 g/dL (ref 13.0–17.0)
Potassium: 3.9 mmol/L (ref 3.5–5.1)
SODIUM: 140 mmol/L (ref 135–145)
TCO2: 27 mmol/L (ref 0–100)

## 2014-10-27 MED ORDER — HYDROCODONE-ACETAMINOPHEN 5-325 MG PO TABS: 1.0000 | ORAL_TABLET | ORAL | Status: DC | PRN

## 2014-10-27 MED ORDER — PREDNISONE 20 MG PO TABS: ORAL_TABLET | ORAL | Status: DC

## 2014-10-27 MED ORDER — GADOBENATE DIMEGLUMINE 529 MG/ML IV SOLN
20.0000 mL | Freq: Once | INTRAVENOUS | Status: AC | PRN
  Administered 2014-10-27: 17 mL via INTRAVENOUS

## 2014-10-27 NOTE — ED Notes (Signed)
Pt c/o chronic lower back pain and numbness/tingling in BLE x 4 days.  Denies injury.  Pt was seen x 3 weeks ago for back pain and referred to a Neurologist.  Pt's visitor reports "we never saw the neurologist.  They referred him back to his PCP and they diagnosed him w/ arthritis.  Now, he is having the back pain.  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

## 2014-10-27 NOTE — ED Notes (Signed)
Pt to MRI

## 2014-10-27 NOTE — ED Provider Notes (Signed)
CSN: [id number]     Arrival date & time 10/27/14  1116 History   First MD Initiated Contact with Patient 10/27/14 1130     Chief Complaint  Patient presents with  . Back Pain  . Numbness    Level 5 caveat--Language barrier (Consider location/radiation/quality/duration/timing/severity/associated sxs/prior Treatment) HPI [name] is a 54 y.o. male with history of seizures, speaks [name] dialect which is not available through interpreter system. Patient is accompanied by friend who gives a generalized history of present illness. Friend states patient has had back pain since Tuesday. Was evaluated by PCP, found to have compression fracture in T12. Since Tuesday, patient has had increased pain versus numbness in bilateral lower extremities. Due to leg was barrier, difficult to differentiate numbness versus pain. Denies fevers, chills, personal history of cancer, IV drug use, loss of bowel or bladder function.  Past Medical History  Diagnosis Date  . Hypertension   . Seizures     due head trauma-seizures are less, but occurs from mild to more severe, which causes agitation to mental state.  . Language barrier 03-01-13    Speaks very little English,primary-Vietnamese "[name] Dialect"  . Depression   . Anxiety    Past Surgical History  Procedure Laterality Date  . No past surgeries      ? one surgery in [place]-? what, has abdominal scar.  . Colonoscopy N/A 03/17/2013    Procedure: COLONOSCOPY;  Surgeon: [name], MD;  Location: [facility];  Service: Endoscopy;  Laterality: N/A;   History reviewed. No pertinent family history. Social History  Substance Use Topics  . Smoking status: Former Smoker    Quit date: 03/02/1989  . Smokeless tobacco: Never Used  . Alcohol Use: No    Review of Systems  Unable to perform ROS      Allergies  Review of patient's allergies indicates no known allergies.  Home Medications   Prior to Admission medications   Medication Sig  Start Date End Date Taking? Authorizing Provider  Aspirin-Caffeine (BAYER BACK & BODY) 500-32.5 MG TABS Take 1 tablet by mouth daily as needed (for pain).   Yes Historical Provider, MD  diclofenac (VOLTAREN) 75 MG EC tablet Take 75 mg by mouth 2 (two) times daily.   Yes Historical Provider, MD  levETIRAcetam (KEPPRA) 1000 MG tablet Take one and one half tabs twice daily Patient taking differently: Take 1,500 mg by mouth 2 (two) times daily.  02/17/14  Yes Marcial Pacas, MD  lisinopril (PRINIVIL,ZESTRIL) 10 MG tablet Take 10 mg by mouth daily with breakfast.    Yes Historical Provider, MD  phenytoin (DILANTIN) 100 MG ER capsule Take 2 capsules (200 mg total) by mouth 2 (two) times daily. 02/17/14  Yes Marcial Pacas, MD  HYDROcodone-acetaminophen (NORCO) 5-325 MG per tablet Take 1-2 tablets by mouth every 4 (four) hours as needed. 10/27/14   Comer Locket, PA-C  oxyCODONE-acetaminophen (PERCOCET/ROXICET) 5-325 MG per tablet Take 1 tablet by mouth every 4 (four) hours as needed. Patient not taking: Reported on 10/27/2014 10/06/14   Larene Pickett, PA-C  predniSONE (DELTASONE) 20 MG tablet 3 tabs po day one, then 2 po daily x 4 days 10/27/14   Comer Locket, PA-C   BP 143/82 mmHg  Pulse 70  Temp(Src) 98.3 F (36.8 C) (Oral)  Resp 18  SpO2 98% Physical Exam  Constitutional: He is oriented to person, place, and time. He appears well-developed and well-nourished.  HENT:  Head: Normocephalic and atraumatic.  Mouth/Throat: Oropharynx is clear and moist.  Eyes: Conjunctivae are normal. Pupils are equal, round, and reactive to light. Right eye exhibits no discharge. Left eye exhibits no discharge. No scleral icterus.  Neck: Neck supple.  Cardiovascular: Normal rate, regular rhythm and normal heart sounds.   Pulmonary/Chest: Effort normal and breath sounds normal. No respiratory distress. He has no wheezes. He has no rales.  Abdominal: Soft. There is no tenderness.  Musculoskeletal: He exhibits no  tenderness.  Neurological: He is alert and oriented to person, place, and time.  Cranial Nerves II-XII grossly intact. Motor appears to be intact. Difficulty to appreciate sensation. Patient is ambulatory with a crutch.  Skin: Skin is warm and dry. No rash noted.  Psychiatric: He has a normal mood and affect.  Nursing note and vitals reviewed.   ED Course  Procedures (including critical care time) Labs Review Labs Reviewed  I-STAT CHEM 8, ED    Imaging Review Mr Lumbar Spine W Wo Contrast  10/27/2014   CLINICAL DATA:  Chronic low back pain. Numbness and tingling bilateral legs.  EXAM: MRI LUMBAR SPINE WITHOUT AND WITH CONTRAST  TECHNIQUE: Multiplanar and multiecho pulse sequences of the lumbar spine were obtained without and with intravenous contrast.  CONTRAST:  65mL MULTIHANCE GADOBENATE DIMEGLUMINE 529 MG/ML IV SOLN  COMPARISON:  Lumbar radiographs 10/06/2014. No prior MRI for comparison  FINDINGS: Normal alignment. No fracture or mass. Normal enhancement following contrast infusion  Lumbar spinal canal is diffusely narrowed compatible with moderately severe congenital stenosis. Conus medullaris is normal and terminates at L1-2. Surgical clip just above the left kidney, question partial nephrectomy on the left upper pole.  T12-L1: Mild facet degeneration.  L1-2: Moderate facet degeneration without significant spinal stenosis.  L2-3: Disc degeneration with diffuse disc bulging. Bilateral facet hypertrophy. Moderate spinal stenosis. Extruded disc fragment on the left with downgoing disc material behind the L3 vertebral body. There is impingement of the left L3 nerve root in the subarticular recess.  L3-4: Diffuse disc bulging and facet hypertrophy. Moderate spinal stenosis. Moderate foraminal encroachment bilaterally.  L4-5: Moderate bulging of the disc with associated endplate osteophyte formation. Bilateral facet hypertrophy. Moderate to severe spinal stenosis. Moderate foraminal stenosis  bilaterally.  L5-S1: Mild disc and facet degeneration without spinal stenosis.  IMPRESSION: Moderately severe congenital lumbar spinal stenosis.  L2-3 disc protrusion on the left. Extruded disc fragment with downgoing disc material compressing the left L3 nerve root. Moderate spinal stenosis L2-3.  Moderate spinal stenosis and foraminal stenosis bilaterally at L3-4 and L4-5 due to disc and facet degeneration.   Electronically Signed   By: Franchot Gallo M.D.   On: 10/27/2014 14:16   I have personally reviewed and evaluated these images and lab results as part of my medical decision-making.   EKG Interpretation None     Meds given in ED:  Medications  gadobenate dimeglumine (MULTIHANCE) injection 20 mL (17 mLs Intravenous Contrast Given 10/27/14 1342)    Discharge Medication List as of 10/27/2014  2:44 PM    START taking these medications   Details  HYDROcodone-acetaminophen (NORCO) 5-325 MG per tablet Take 1-2 tablets by mouth every 4 (four) hours as needed., Starting 10/27/2014, Until Discontinued, Print    predniSONE (DELTASONE) 20 MG tablet 3 tabs po day one, then 2 po daily x 4 days, Print       Filed Vitals:   10/27/14 1126 10/27/14 1414  BP: 168/93 143/82  Pulse: 71 70  Temp: 97.8 F (36.6 C) 98.3 F (36.8 C)  TempSrc: Oral Oral  Resp: 16  18  SpO2: 99% 98%    MDM  Vitals stable - WNL -afebrile Pt resting comfortably in ED. PE--physical exam as above, difficult to ascertain neurologic function. Due to language barrier and complaint of pain versus numbness in bilateral thighs, will obtain MR lumbar spine to evaluate for cord pathology. Labwork--labs are noncontributory. Imaging--MR spine shows moderately severe congenital lumbar spine stenosis. There is also an L2-3 disc protrusion on the left compressing the left L3 nerve root, moderate spinal stenosis at L3-4 and L4-5 due to degeneration. No acute pathology requiring emergent surgical intervention.  Plan is to discharge  patient home with steroid taper, pain medicines and referral to neurosurgery for evaluation of surgical intervention. Discussed patient presentation and ED course the my attending, Dr. Ashok Cordia who also saw and evaluated the patient. Agrees with above plan. I discussed all relevant lab findings and imaging results with pt and they verbalized understanding. Discussed f/u with PCP within 48 hrs and return precautions, pt very amenable to plan.  Final diagnoses:  Numbness  Bilateral low back pain, with sciatica presence unspecified      Comer Locket, PA-C 10/28/14 9833  Lajean Saver, MD 10/28/14 [phone]

## 2014-10-27 NOTE — Discharge Instructions (Signed)
You were evaluated in the ED today for your low back pain. Your found to have spinal stenosis. It is important to follow-up with your doctor for reevaluation. It is also important for you to follow-up with neurosurgery for reevaluation and possible surgical intervention for your back discomfort. Please take her medicines as prescribed. Do not take pain medicine before driving or operating machinery. Return to ED for new or worsening symptoms.

## 2014-10-31 ENCOUNTER — Telehealth: Payer: Self-pay | Admitting: Neurology

## 2014-10-31 MED ORDER — PHENYTOIN SODIUM EXTENDED 100 MG PO CAPS: 200.0000 mg | ORAL_CAPSULE | Freq: Two times a day (BID) | ORAL | Status: DC

## 2014-10-31 MED ORDER — LEVETIRACETAM 1000 MG PO TABS: ORAL_TABLET | ORAL | Status: DC

## 2014-10-31 NOTE — Telephone Encounter (Signed)
Caregiver called to request refill on levETIRAcetam (KEPPRA) 1000 MG tablet,lisinopril (PRINIVIL,ZESTRIL) 10 MG tablet and phenytoin (DILANTIN) 100 MG ER capsule. Pharmacy [facility] on [address].

## 2014-10-31 NOTE — Telephone Encounter (Signed)
It does not appear we have prescribed Lisinopril before.  I called back.  Caregiver verified patient gets this med from PCP.  We have refilled Keppra and Dilantin.  Patient has appt scheduled.

## 2014-12-31 ENCOUNTER — Other Ambulatory Visit: Payer: Self-pay | Admitting: Neurology

## 2015-01-07 ENCOUNTER — Encounter: Payer: Self-pay | Admitting: Nurse Practitioner

## 2015-01-07 ENCOUNTER — Ambulatory Visit (INDEPENDENT_AMBULATORY_CARE_PROVIDER_SITE_OTHER): Payer: Medicare Other | Admitting: Nurse Practitioner

## 2015-01-07 VITALS — BP 105/62 | HR 98 | Ht 65.5 in | Wt 185.4 lb

## 2015-01-07 DIAGNOSIS — G40319 Generalized idiopathic epilepsy and epileptic syndromes, intractable, without status epilepticus: Secondary | ICD-10-CM | POA: Diagnosis not present

## 2015-01-07 DIAGNOSIS — G40219 Localization-related (focal) (partial) symptomatic epilepsy and epileptic syndromes with complex partial seizures, intractable, without status epilepticus: Secondary | ICD-10-CM

## 2015-01-07 DIAGNOSIS — Z5181 Encounter for therapeutic drug level monitoring: Secondary | ICD-10-CM | POA: Diagnosis not present

## 2015-01-07 MED ORDER — LEVETIRACETAM 1000 MG PO TABS: ORAL_TABLET | ORAL | Status: DC

## 2015-01-07 MED ORDER — PHENYTOIN SODIUM EXTENDED 100 MG PO CAPS: 200.0000 mg | ORAL_CAPSULE | Freq: Two times a day (BID) | ORAL | Status: DC

## 2015-01-07 NOTE — Patient Instructions (Signed)
Will check labs, CBC, CMP and Dilantin level RX for both Dilantin and Keppra printed off for caregiver per request F/U yearly and prn Call for seizure activity

## 2015-01-07 NOTE — Progress Notes (Signed)
[facility]  PATIENT: [name] DOB: 1960-06-14   REASON FOR VISIT: follow-up for generalized epilepsy HISTORY FROM:caregiver [name]    HISTORY OF PRESENT ILLNESS:Mr Power, 54 year old male returns for followup. He does not speak [place]. He is accompanied by his 24/ 7 caregiver. He has a seizure disorder and is currently on Dilantin 400 mg daily, Keppra 1500 mg twice daily, he has been unable to afford Vimpat 200 twice daily.  His last seizure activity was approximately 2 weeks ago when he missed a few doses of his Dilantin. Appetite is reportedly good, sleeps well at night. No falls, no balance issues.recently diagnosed with low back pain and is currently getting some physical therapy and on narcotic pain medication.He returns for reevaluation    HISTORY: He does not speak [place] and understands very little spoken [place]. He has a caregiver with him today who intreprets. By history he has mild cerebral palsy and a possible head injury in childhood. He has hx of gout and HTN. He has a 4th grade education and lives with his caregiver.  His brother says his seizures have been in good control. He continues to have problems with short term memory and learning new information. He has no problems with day to day functioning. He is independent with ADL's. He had an admission to inpatient Behavorial health in January and June for uncontrolled anger.   He has been tapered off phenobarbital, he is not taking Dilantin 100 mg 2 tablets twice a day, levetiracetam 1000mg  1 and a half tablets twice a day. He continues to have seizures, the longest    REVIEW OF SYSTEMS: Full 14 system review of systems performed and notable only for those listed, all others are neg:  Constitutional: neg  Cardiovascular: neg Ear/Nose/Throat: neg  Skin: neg Eyes: neg Respiratory: neg Gastroitestinal: neg  Hematology/Lymphatic: neg  Endocrine: neg Musculoskeletal:back  pain Allergy/Immunology: neg Neurological: seizure disorder, memory loss Psychiatric: decreased concentration Sleep : neg   ALLERGIES: No Known Allergies  HOME MEDICATIONS: Outpatient Prescriptions Prior to Visit  Medication Sig Dispense Refill  . Aspirin-Caffeine (BAYER BACK & BODY) 500-32.5 MG TABS Take 1 tablet by mouth daily as needed (for pain).    [name] diclofenac (VOLTAREN) 75 MG EC tablet Take 75 mg by mouth 2 (two) times daily.    [name] levETIRAcetam (KEPPRA) 1000 MG tablet Take one and one half tabs twice daily 270 tablet 0  . lisinopril (PRINIVIL,ZESTRIL) 10 MG tablet Take 10 mg by mouth daily with breakfast.     . phenytoin (DILANTIN) 100 MG ER capsule TAKE TWO CAPSULES BY MOUTH TWICE DAILY 120 capsule 0  . HYDROcodone-acetaminophen (NORCO) 5-325 MG per tablet Take 1-2 tablets by mouth every 4 (four) hours as needed. (Patient not taking: Reported on 01/07/2015) 12 tablet 0  . oxyCODONE-acetaminophen (PERCOCET/ROXICET) 5-325 MG per tablet Take 1 tablet by mouth every 4 (four) hours as needed. (Patient not taking: Reported on 10/27/2014) 20 tablet 0  . predniSONE (DELTASONE) 20 MG tablet 3 tabs po day one, then 2 po daily x 4 days (Patient not taking: Reported on 01/07/2015) 11 tablet 0   No facility-administered medications prior to visit.    PAST MEDICAL HISTORY: Past Medical History  Diagnosis Date  . Hypertension   . Seizures ([place])     due head trauma-seizures are less, but occurs from mild to more severe, which causes agitation to mental state.  . Language barrier 03-01-13    Speaks very little English,primary-Vietnamese "[name] Dialect"  .  Depression   . Anxiety   . Back pain     low back pain/ compressed disk in lower back.     PAST SURGICAL HISTORY: Past Surgical History  Procedure Laterality Date  . No past surgeries      ? one surgery in [place]-? what, has abdominal scar.  . Colonoscopy N/A 03/17/2013    Procedure: COLONOSCOPY;  Surgeon: [name], MD;   Location: [facility];  Service: Endoscopy;  Laterality: N/A;    FAMILY HISTORY: History reviewed. No pertinent family history.  SOCIAL HISTORY: Social History   Social History  . Marital Status: Single    Spouse Name: N/A  . Number of Children: N/A  . Years of Education: 4   Occupational History  .     Social History Main Topics  . Smoking status: Former Smoker    Quit date: 03/02/1989  . Smokeless tobacco: Never Used  . Alcohol Use: No  . Drug Use: No  . Sexual Activity: Not Currently   Other Topics Concern  . Not on file   Social History Narrative   ** Merged History Encounter **    Patient is single.   Patient is disabled.   Patient has a 4th grade education.   Patient drinks two cups of coffee daily.     PHYSICAL EXAM  Filed Vitals:   01/07/15 1426  BP: 105/62  Pulse: 98  Height: 5' 5.5" (1.664 m)  Weight: 185 lb 6.4 oz (84.097 kg)   Body mass index is 30.37 kg/(m^2). Generalized: Well developed, in no acute distress  Head: normocephalic and atraumatic,. Oropharynx benign  Neck: Supple, no carotid bruits  Cardiac: Regular rate rhythm, no murmur  Musculoskeletal: No deformity   Neurological examination   Mentation: Alert oriented to time, place, history taking. Follows all commands , does not speak or understand [place].   Cranial nerve II-XII: Pupils were equal round reactive to light extraocular movements were full, visual field were full on confrontational test. Left exotropia. Facial sensation and strength were normal. hearing was intact to finger rubbing bilaterally. Uvula tongue midline. head turning and shoulder shrug were normal and symmetric.Tongue protrusion into cheek strength was normal. Motor: normal bulk and tone, full strength in the BUE, BLE, No focal weakness Sensory: normal and symmetric to light touch, pinprick, and vibration , withdraws to pain Coordination: finger-nose-finger, heel-to-shin bilaterally, no  dysmetria Reflexes: 1+ upper lower and symmetric  Gait and Station: Rising up from seated position without assistance, normal stance, moderate stride, good arm swing, smooth turning, no assistive device  DIAGNOSTIC DATA (LABS, IMAGING, TESTING) - I reviewed patient records, labs, notes, testing and imaging myself where available.  Lab Results  Component Value Date   WBC 10.3 01/03/2014   HGB 16.0 10/27/2014   HCT 47.0 10/27/2014   MCV 89 01/03/2014   PLT 364 08/05/2012      Component Value Date/Time   NA 140 10/27/2014 1313   NA 140 01/03/2014 1539   K 3.9 10/27/2014 1313   CL 102 10/27/2014 1313   CO2 26 01/03/2014 1539   GLUCOSE 95 10/27/2014 1313   GLUCOSE 77 01/03/2014 1539   BUN 19 10/27/2014 1313   BUN 21 01/03/2014 1539   CREATININE 1.00 10/27/2014 1313   CALCIUM 10.0 01/03/2014 1539   PROT 7.9 01/03/2014 1539   PROT 8.3 08/04/2012 1430   ALBUMIN 4.3 01/03/2014 1539   ALBUMIN 3.9 08/04/2012 1430   AST 22 01/03/2014 1539   ALT 16 01/03/2014 1539  ALKPHOS 90 01/03/2014 1539   BILITOT 0.2 01/03/2014 1539   GFRNONAA 60 01/03/2014 1539   GFRAA 70 01/03/2014 1539       ASSESSMENT AND PLAN  54 y.o. year old male  has a past medical history of Hypertension; Seizures ([place]); Language barrier (03-01-13); Depression; Anxiety; and Back pain. here to follow up   Will check labs, CBC, CMP and Dilantin level took Dilantin at 7 am it is now 3pm RX for both Dilantin and Keppra printed off for caregiver per request F/U yearly and prn Call for seizure activity Dennie Bible, [facility], [facility], [facility] [address], [place], [place] [address] [phone]

## 2015-01-07 NOTE — Progress Notes (Signed)
I have reviewed and agreed above plan. 

## 2015-01-08 ENCOUNTER — Telehealth: Payer: Self-pay | Admitting: *Deleted

## 2015-01-08 LAB — COMPREHENSIVE METABOLIC PANEL
ALK PHOS: 95 IU/L (ref 39–117)
ALT: 13 IU/L (ref 0–44)
AST: 15 IU/L (ref 0–40)
Albumin/Globulin Ratio: 1.3 (ref 1.1–2.5)
Albumin: 4.2 g/dL (ref 3.5–5.5)
BUN/Creatinine Ratio: 14 (ref 9–20)
BUN: 17 mg/dL (ref 6–24)
CHLORIDE: 98 mmol/L (ref 97–106)
CO2: 24 mmol/L (ref 18–29)
CREATININE: 1.25 mg/dL (ref 0.76–1.27)
Calcium: 9.5 mg/dL (ref 8.7–10.2)
GFR calc Af Amer: 74 mL/min/{1.73_m2} (ref >59)
GFR calc non Af Amer: 64 mL/min/{1.73_m2} (ref >59)
GLUCOSE: 127 mg/dL — AB (ref 65–99)
Globulin, Total: 3.3 g/dL (ref 1.5–4.5)
Potassium: 4.4 mmol/L (ref 3.5–5.2)
SODIUM: 140 mmol/L (ref 136–144)
Total Protein: 7.5 g/dL (ref 6.0–8.5)

## 2015-01-08 LAB — CBC WITH DIFFERENTIAL/PLATELET
BASOS ABS: 0 10*3/uL (ref 0.0–0.2)
Basos: 0 %
EOS (ABSOLUTE): 0.4 10*3/uL (ref 0.0–0.4)
EOS: 6 %
HEMATOCRIT: 41.3 % (ref 37.5–51.0)
Hemoglobin: 13.9 g/dL (ref 12.6–17.7)
IMMATURE GRANULOCYTES: 0 %
Immature Grans (Abs): 0 10*3/uL (ref 0.0–0.1)
Lymphocytes Absolute: 2 10*3/uL (ref 0.7–3.1)
Lymphs: 30 %
MCH: 30.9 pg (ref 26.6–33.0)
MCHC: 33.7 g/dL (ref 31.5–35.7)
MCV: 92 fL (ref 79–97)
MONOS ABS: 0.5 10*3/uL (ref 0.1–0.9)
Monocytes: 8 %
NEUTROS PCT: 56 %
Neutrophils Absolute: 3.7 10*3/uL (ref 1.4–7.0)
PLATELETS: 388 10*3/uL — AB (ref 150–379)
RBC: 4.5 x10E6/uL (ref 4.14–5.80)
RDW: 13.2 % (ref 12.3–15.4)
WBC: 6.6 10*3/uL (ref 3.4–10.8)

## 2015-01-08 LAB — PHENYTOIN LEVEL, TOTAL: Phenytoin (Dilantin), Serum: 14 ug/mL (ref 10.0–20.0)

## 2015-01-08 NOTE — Telephone Encounter (Signed)
I spoke to [facility], caregiver for pt.  Let him know that the labs looked good for pt.   He would let pt know.

## 2015-01-08 NOTE — Telephone Encounter (Signed)
-----   Message from Dennie Bible, NP sent at 01/08/2015  8:28 AM EST ----- Labs look good please call the caregiver

## 2015-01-09 NOTE — Progress Notes (Signed)
I have reviewed and agreed above plan. 

## 2015-03-21 ENCOUNTER — Encounter (HOSPITAL_COMMUNITY): Payer: Self-pay

## 2015-03-21 ENCOUNTER — Emergency Department (HOSPITAL_COMMUNITY)
Admission: EM | Admit: 2015-03-21 | Discharge: 2015-03-21 | Disposition: A | Discharge status: Home / Self Care | Payer: Medicare Other | Attending: Emergency Medicine | Admitting: Emergency Medicine

## 2015-03-21 DIAGNOSIS — Z87891 Personal history of nicotine dependence: Secondary | ICD-10-CM | POA: Diagnosis not present

## 2015-03-21 DIAGNOSIS — Z7982 Long term (current) use of aspirin: Secondary | ICD-10-CM | POA: Diagnosis not present

## 2015-03-21 DIAGNOSIS — G40909 Epilepsy, unspecified, not intractable, without status epilepticus: Secondary | ICD-10-CM | POA: Diagnosis not present

## 2015-03-21 DIAGNOSIS — Z8659 Personal history of other mental and behavioral disorders: Secondary | ICD-10-CM | POA: Diagnosis not present

## 2015-03-21 DIAGNOSIS — I10 Essential (primary) hypertension: Secondary | ICD-10-CM | POA: Diagnosis not present

## 2015-03-21 DIAGNOSIS — R569 Unspecified convulsions: Secondary | ICD-10-CM | POA: Diagnosis present

## 2015-03-21 DIAGNOSIS — R0682 Tachypnea, not elsewhere classified: Secondary | ICD-10-CM | POA: Insufficient documentation

## 2015-03-21 DIAGNOSIS — Z79899 Other long term (current) drug therapy: Secondary | ICD-10-CM | POA: Diagnosis not present

## 2015-03-21 DIAGNOSIS — R000 Tachycardia, unspecified: Secondary | ICD-10-CM | POA: Diagnosis not present

## 2015-03-21 HISTORY — DX: Unspecified intracranial injury with loss of consciousness status unknown, initial encounter: S06.9XAA

## 2015-03-21 HISTORY — DX: Unspecified intracranial injury with loss of consciousness of unspecified duration, initial encounter: S06.9X9A

## 2015-03-21 LAB — COMPREHENSIVE METABOLIC PANEL
ALK PHOS: 81 U/L (ref 38–126)
ALT: 30 U/L (ref 17–63)
ANION GAP: 14 (ref 5–15)
AST: 39 U/L (ref 15–41)
Albumin: 4.7 g/dL (ref 3.5–5.0)
BILIRUBIN TOTAL: 0.5 mg/dL (ref 0.3–1.2)
BUN: 12 mg/dL (ref 6–20)
CALCIUM: 9.7 mg/dL (ref 8.9–10.3)
CO2: 26 mmol/L (ref 22–32)
Chloride: 102 mmol/L (ref 101–111)
Creatinine, Ser: 1.36 mg/dL — ABNORMAL HIGH (ref 0.61–1.24)
GFR, EST NON AFRICAN AMERICAN: 57 mL/min — AB (ref >60)
Glucose, Bld: 143 mg/dL — ABNORMAL HIGH (ref 65–99)
Potassium: 3.1 mmol/L — ABNORMAL LOW (ref 3.5–5.1)
Sodium: 142 mmol/L (ref 135–145)
TOTAL PROTEIN: 9.4 g/dL — AB (ref 6.5–8.1)

## 2015-03-21 LAB — CBC WITH DIFFERENTIAL/PLATELET
BASOS ABS: 0 10*3/uL (ref 0.0–0.1)
BASOS PCT: 0 %
EOS ABS: 0.1 10*3/uL (ref 0.0–0.7)
Eosinophils Relative: 1 %
HEMATOCRIT: 43.8 % (ref 39.0–52.0)
HEMOGLOBIN: 14.7 g/dL (ref 13.0–17.0)
Lymphocytes Relative: 14 %
Lymphs Abs: 1.6 10*3/uL (ref 0.7–4.0)
MCH: 31 pg (ref 26.0–34.0)
MCHC: 33.6 g/dL (ref 30.0–36.0)
MCV: 92.4 fL (ref 78.0–100.0)
MONO ABS: 0.9 10*3/uL (ref 0.1–1.0)
Monocytes Relative: 8 %
NEUTROS ABS: 8.3 10*3/uL — AB (ref 1.7–7.7)
NEUTROS PCT: 77 %
Platelets: 366 10*3/uL (ref 150–400)
RBC: 4.74 MIL/uL (ref 4.22–5.81)
RDW: 12.9 % (ref 11.5–15.5)
WBC: 10.8 10*3/uL — ABNORMAL HIGH (ref 4.0–10.5)

## 2015-03-21 LAB — PHENYTOIN LEVEL, TOTAL: PHENYTOIN LVL: 7.2 ug/mL — AB (ref 10.0–20.0)

## 2015-03-21 LAB — ETHANOL

## 2015-03-21 MED ORDER — SODIUM CHLORIDE 0.9 % IV SOLN
1000.0000 mg | Freq: Once | INTRAVENOUS | Status: AC
  Administered 2015-03-21: 1000 mg via INTRAVENOUS
  Filled 2015-03-21: qty 20

## 2015-03-21 MED ORDER — LORAZEPAM 2 MG/ML IJ SOLN
2.0000 mg | Freq: Once | INTRAMUSCULAR | Status: AC
  Administered 2015-03-21: 2 mg via INTRAVENOUS
  Filled 2015-03-21: qty 1

## 2015-03-21 MED ORDER — SODIUM CHLORIDE 0.9 % IV BOLUS (SEPSIS)
1000.0000 mL | Freq: Once | INTRAVENOUS | Status: AC
  Administered 2015-03-21: 1000 mL via INTRAVENOUS

## 2015-03-21 NOTE — ED Provider Notes (Signed)
CSN: [id number]     Arrival date & time 03/21/15  [id number] History   First MD Initiated Contact with Patient 03/21/15 0818     Chief Complaint  Patient presents with  . Seizures     (Consider location/radiation/quality/duration/timing/severity/associated sxs/prior Treatment)  LEVEL 5 CAVEAT--SEIZURE, LANGUAGE BARRIER DESPITE INTERPRETER  Patient is a 55 y.o. male presenting with seizures. The history is provided by the patient, the EMS personnel and medical records. The history is limited by a language barrier, the absence of a caregiver and the condition of the patient. A language interpreter was used [place]).  Seizures Seizure activity on arrival: no   Seizure type:  Unable to specify Postictal symptoms: confusion   History of seizures: yes   [name] is a 55 y.o. male with PMH significant for HTN, depression, anxiety, back pain who presents with seizure this morning lasting 2-3 minutes involving the entire body.  No injury or incontinence.  Patient has been compliant with medication with his last seizure being Tuesday.  No fever, N/V/D.  Patient was postictal upon EMS arrival, oriented to person only; however, upon arrival to ED patient alert and oriented x 4.  Upon medical chart review, patient seen at [facility] currently on Keppra and Phenytoin with break through seizures.     Past Medical History  Diagnosis Date  . Hypertension   . Seizures ([place])     due head trauma-seizures are less, but occurs from mild to more severe, which causes agitation to mental state.  . Language barrier 03-01-13    Speaks very little English,primary-Vietnamese "[name] Dialect"  . Depression   . Anxiety   . Back pain     low back pain/ compressed disk in lower back.    Past Surgical History  Procedure Laterality Date  . No past surgeries      ? one surgery in [place]-? what, has abdominal scar.  . Colonoscopy N/A 03/17/2013    Procedure: COLONOSCOPY;  Surgeon: [name], MD;   Location: [facility];  Service: Endoscopy;  Laterality: N/A;  . Abdominal surgery     No family history on file. Social History  Substance Use Topics  . Smoking status: Former Smoker    Quit date: 03/02/1989  . Smokeless tobacco: Never Used  . Alcohol Use: No    Review of Systems  Neurological: Positive for seizures.  All other systems negative unless otherwise stated in HPI     Allergies  Review of patient's allergies indicates no known allergies.  Home Medications   Prior to Admission medications   Medication Sig Start Date End Date Taking? Authorizing Provider  acetaminophen (TYLENOL) 500 MG tablet Take 500 mg by mouth every 6 (six) hours as needed for moderate pain or headache.   Yes Historical Provider, MD  aspirin EC 81 MG tablet Take 81 mg by mouth daily as needed for mild pain.   Yes Historical Provider, MD  cetirizine (ZYRTEC) 10 MG tablet Take 10 mg by mouth daily as needed for allergies.   Yes Historical Provider, MD  fluticasone (FLONASE) 50 MCG/ACT nasal spray Place 1 spray into both nostrils daily as needed for allergies or rhinitis.   Yes Historical Provider, MD  levETIRAcetam (KEPPRA) 1000 MG tablet Take one and one half tabs twice daily Patient taking differently: Take 1,500 mg by mouth 2 (two) times daily.  01/07/15  Yes Dennie Bible, NP  lisinopril (PRINIVIL,ZESTRIL) 10 MG tablet Take 10 mg by mouth daily with breakfast.  Yes Historical Provider, MD  phenytoin (DILANTIN) 100 MG ER capsule Take 2 capsules (200 mg total) by mouth 2 (two) times daily. 01/07/15  Yes Dennie Bible, NP   BP 137/92 mmHg  Pulse 111  Temp(Src) 98.4 F (36.9 C) (Oral)  Resp 18  SpO2 100% Physical Exam  Constitutional: He appears well-developed and well-nourished.  HENT:  Head: Normocephalic and atraumatic.  Mouth/Throat: Oropharynx is clear and moist.  No signs of trauma.   Eyes: Conjunctivae are normal. Pupils are equal, round, and reactive to light.  Neck:  Normal range of motion. Neck supple.  Cardiovascular: Regular rhythm and normal heart sounds.  Tachycardia present.   No murmur heard. Pulmonary/Chest: Effort normal and breath sounds normal. No accessory muscle usage or stridor. Tachypnea noted. No respiratory distress. He has no wheezes. He has no rhonchi. He has no rales.  Abdominal: Soft. Bowel sounds are normal. He exhibits no distension. There is no tenderness.  Musculoskeletal: Normal range of motion.  Lymphadenopathy:    He has no cervical adenopathy.  Neurological: He is alert. GCS eye subscore is 4. GCS verbal subscore is 5. GCS motor subscore is 6.  Speech clear without dysarthria.  Patient will awaken to verbal stimuli and will follow commands.    Skin: Skin is warm and dry.  Psychiatric: He has a normal mood and affect. His behavior is normal.    ED Course  Procedures (including critical care time) Labs Review Labs Reviewed  COMPREHENSIVE METABOLIC PANEL - Abnormal; Notable for the following:    Potassium 3.1 (*)    Glucose, Bld 143 (*)    Creatinine, Ser 1.36 (*)    Total Protein 9.4 (*)    GFR calc non Af Amer 57 (*)    All other components within normal limits  CBC WITH DIFFERENTIAL/PLATELET - Abnormal; Notable for the following:    WBC 10.8 (*)    Neutro Abs 8.3 (*)    All other components within normal limits  PHENYTOIN LEVEL, TOTAL - Abnormal; Notable for the following:    Phenytoin Lvl 7.2 (*)    All other components within normal limits  ETHANOL    Imaging Review No results found. I have personally reviewed and evaluated these images and lab results as part of my medical decision-making.   EKG Interpretation   Date/Time:  Thursday March 21 2015 08:30:49 EST Ventricular Rate:  141 PR Interval:    QRS Duration: 93 QT Interval:  289 QTC Calculation: 443 R Axis:   72 Text Interpretation:  Atrial flutter Artifact in lead(s) I II III aVR aVL  Sinus tachycardia Premature ventricular complexes Abnormal  ekg Confirmed  by Carmin Muskrat  MD [phone]) on 03/21/2015 8:40:14 AM      MDM   Final diagnoses:  Seizure ([place])  Seizure disorder ([place])   Patient with known seizure history on Keppra and phenytoin presents with seizure just prior to arrival.  Patient speaks [place], and multiple interpreters attempted communicating with the patient. However, he was unable to understand them. Therefore, history limited. Initial vitals showed HR of 142 and BP of 197/116, he is afebrile T99.6, with tachypnea RR 26. Patient has not had his a.m. medications, and suspect this is related to seizure activity. Will continue to monitor. We'll obtain a CMP, CBC, phenytoin level, urinalysis, UDS, and EtOH. 10 AM: BP has improved 132/79 and HR 102.    CBC remarkable for mild leukocytosis of 10.8, I suspect this is related to seizure activity.  Phenytoin levels are 7.2, subtherapeutic, will give 1 g phenytoin and continue to monitor. CMP remarkable for potassium 3.1, glucose 143, and Cr 1.36.  This appears to be his baseline.  Upon reassessment, patient sleeping comfortably in bed and protecting airway.  Unable to obtain urine sample due to language barrier.  I have very low suspicion for drug or infectious process.  Seizure likely secondary to sub-therapeutic dilantin and hx of breakthrough seizures despite medical compliance.   1:45 PM: patient's caregiver at bedside.  Compliant with medications.  No injury/fall.  Typical seizure presentation.  Patient has been seizure free throughout ED course and lying comfortably in bed. BP and HR have resolved.  Evaluation does not show pathology requiring ongoing emergent intervention or admission. Pt is hemodynamically stable and mentating appropriately. Discussed findings/results and plan with patient/guardian, who agrees with plan. All questions answered. Return precautions discussed and outpatient follow up given.   Case has been discussed with and seen by  Dr. Vanita Panda who agrees with the above plan for discharge.      Gloriann Loan, PA-C 03/21/15 [place], MD 03/21/15 [phone]

## 2015-03-21 NOTE — ED Notes (Signed)
Family at bedside. 

## 2015-03-21 NOTE — ED Notes (Signed)
EDPA KAYLA in to see pt and evaluate. Witnessed seizure lasting less than 1 minute. Edison Nasuti NT and [place]  NT present. No injury to pt.

## 2015-03-21 NOTE — ED Notes (Signed)
MD at bedside. 

## 2015-03-21 NOTE — Discharge Instructions (Signed)

## 2015-03-21 NOTE — ED Notes (Signed)
EDPA KAYA PRESENT TO SPEAK WITH FAMILY (BROTHER) WHO CARES FOR THIS PT.

## 2015-03-21 NOTE — ED Notes (Signed)
Pt aware of urine sample 

## 2015-03-21 NOTE — ED Notes (Signed)
Per [facility]- Witnessed seizure approx. 2-3 minutes. Full body. No injury, no incontinence. HX of seizure. Pt complaint with medication. Last seizure Tuesday. Denies N/V/D and fever. Pt at present hyperventilating into emesis bag. No acute distress present this Probation officer or EMS. Pt limited engish. Pt postictal upon EMS arrival on scene to person only however at present pt alert and oriented alert x4.

## 2015-03-21 NOTE — ED Notes (Signed)
Bed: [id number] Expected date:  Expected time:  Means of arrival:  Comments: Seizure

## 2015-03-21 NOTE — ED Notes (Signed)
MD Vanita Panda have been info about PT not able to give urine sample. Does not want in and out at this time.

## 2015-03-21 NOTE — ED Notes (Signed)
EDP LOCKWOOD PRESENT TO EVALUATE THIS PT

## 2015-03-21 NOTE — ED Notes (Signed)
Pt encouraged to urinate. Assisted with urinal. Pt unable to urinate at this time.

## 2015-07-03 ENCOUNTER — Observation Stay (HOSPITAL_COMMUNITY): Payer: Medicare Other

## 2015-07-03 ENCOUNTER — Encounter (HOSPITAL_COMMUNITY): Payer: Self-pay | Admitting: *Deleted

## 2015-07-03 ENCOUNTER — Emergency Department (HOSPITAL_COMMUNITY): Payer: Medicare Other

## 2015-07-03 ENCOUNTER — Inpatient Hospital Stay (HOSPITAL_COMMUNITY)
Admission: EM | Admit: 2015-07-03 | Discharge: 2015-07-06 | DRG: 101 | Disposition: A | Discharge status: Home / Self Care | Payer: Medicare Other | Attending: Internal Medicine | Admitting: Internal Medicine

## 2015-07-03 DIAGNOSIS — E876 Hypokalemia: Secondary | ICD-10-CM | POA: Diagnosis present

## 2015-07-03 DIAGNOSIS — Z9114 Patient's other noncompliance with medication regimen: Secondary | ICD-10-CM | POA: Diagnosis not present

## 2015-07-03 DIAGNOSIS — G40909 Epilepsy, unspecified, not intractable, without status epilepticus: Principal | ICD-10-CM | POA: Diagnosis present

## 2015-07-03 DIAGNOSIS — A419 Sepsis, unspecified organism: Secondary | ICD-10-CM | POA: Diagnosis present

## 2015-07-03 DIAGNOSIS — Z79899 Other long term (current) drug therapy: Secondary | ICD-10-CM

## 2015-07-03 DIAGNOSIS — I10 Essential (primary) hypertension: Secondary | ICD-10-CM | POA: Diagnosis not present

## 2015-07-03 DIAGNOSIS — F329 Major depressive disorder, single episode, unspecified: Secondary | ICD-10-CM

## 2015-07-03 DIAGNOSIS — N182 Chronic kidney disease, stage 2 (mild): Secondary | ICD-10-CM | POA: Diagnosis not present

## 2015-07-03 DIAGNOSIS — N200 Calculus of kidney: Secondary | ICD-10-CM | POA: Diagnosis not present

## 2015-07-03 DIAGNOSIS — Z87891 Personal history of nicotine dependence: Secondary | ICD-10-CM

## 2015-07-03 DIAGNOSIS — Z87820 Personal history of traumatic brain injury: Secondary | ICD-10-CM | POA: Diagnosis not present

## 2015-07-03 DIAGNOSIS — R109 Unspecified abdominal pain: Secondary | ICD-10-CM | POA: Insufficient documentation

## 2015-07-03 DIAGNOSIS — R569 Unspecified convulsions: Secondary | ICD-10-CM | POA: Diagnosis not present

## 2015-07-03 DIAGNOSIS — E872 Acidosis, unspecified: Secondary | ICD-10-CM

## 2015-07-03 DIAGNOSIS — F419 Anxiety disorder, unspecified: Secondary | ICD-10-CM | POA: Diagnosis present

## 2015-07-03 DIAGNOSIS — H5010 Unspecified exotropia: Secondary | ICD-10-CM | POA: Diagnosis present

## 2015-07-03 DIAGNOSIS — I129 Hypertensive chronic kidney disease with stage 1 through stage 4 chronic kidney disease, or unspecified chronic kidney disease: Secondary | ICD-10-CM | POA: Diagnosis present

## 2015-07-03 DIAGNOSIS — Z7982 Long term (current) use of aspirin: Secondary | ICD-10-CM | POA: Diagnosis not present

## 2015-07-03 DIAGNOSIS — R1013 Epigastric pain: Secondary | ICD-10-CM | POA: Diagnosis not present

## 2015-07-03 DIAGNOSIS — R930 Abnormal findings on diagnostic imaging of skull and head, not elsewhere classified: Secondary | ICD-10-CM | POA: Diagnosis not present

## 2015-07-03 DIAGNOSIS — R4182 Altered mental status, unspecified: Secondary | ICD-10-CM | POA: Diagnosis not present

## 2015-07-03 DIAGNOSIS — F32A Depression, unspecified: Secondary | ICD-10-CM | POA: Diagnosis present

## 2015-07-03 HISTORY — DX: Chronic kidney disease, stage 2 (mild): N18.2

## 2015-07-03 LAB — CBC WITH DIFFERENTIAL/PLATELET
BASOS ABS: 0 10*3/uL (ref 0.0–0.1)
BASOS PCT: 0 %
EOS PCT: 0 %
Eosinophils Absolute: 0 10*3/uL (ref 0.0–0.7)
HCT: 38.7 % — ABNORMAL LOW (ref 39.0–52.0)
Hemoglobin: 13 g/dL (ref 13.0–17.0)
Lymphocytes Relative: 4 %
Lymphs Abs: 0.7 10*3/uL (ref 0.7–4.0)
MCH: 30 pg (ref 26.0–34.0)
MCHC: 33.6 g/dL (ref 30.0–36.0)
MCV: 89.4 fL (ref 78.0–100.0)
MONO ABS: 1.3 10*3/uL — AB (ref 0.1–1.0)
Monocytes Relative: 8 %
NEUTROS ABS: 15.7 10*3/uL — AB (ref 1.7–7.7)
NEUTROS PCT: 88 %
PLATELETS: 325 10*3/uL (ref 150–400)
RBC: 4.33 MIL/uL (ref 4.22–5.81)
RDW: 13 % (ref 11.5–15.5)
WBC: 17.7 10*3/uL — AB (ref 4.0–10.5)

## 2015-07-03 LAB — URINE MICROSCOPIC-ADD ON: WBC, UA: NONE SEEN WBC/hpf (ref 0–5)

## 2015-07-03 LAB — URINALYSIS, ROUTINE W REFLEX MICROSCOPIC
Bilirubin Urine: NEGATIVE
GLUCOSE, UA: NEGATIVE mg/dL
KETONES UR: NEGATIVE mg/dL
LEUKOCYTES UA: NEGATIVE
Nitrite: NEGATIVE
PROTEIN: NEGATIVE mg/dL
Specific Gravity, Urine: 1.016 (ref 1.005–1.030)
pH: 6 (ref 5.0–8.0)

## 2015-07-03 LAB — COMPREHENSIVE METABOLIC PANEL
ALT: 24 U/L (ref 17–63)
ANION GAP: 12 (ref 5–15)
AST: 38 U/L (ref 15–41)
Albumin: 3.4 g/dL — ABNORMAL LOW (ref 3.5–5.0)
Alkaline Phosphatase: 70 U/L (ref 38–126)
BUN: 10 mg/dL (ref 6–20)
CHLORIDE: 102 mmol/L (ref 101–111)
CO2: 21 mmol/L — AB (ref 22–32)
Calcium: 7.9 mg/dL — ABNORMAL LOW (ref 8.9–10.3)
Creatinine, Ser: 1.27 mg/dL — ABNORMAL HIGH (ref 0.61–1.24)
Glucose, Bld: 130 mg/dL — ABNORMAL HIGH (ref 65–99)
POTASSIUM: 3.5 mmol/L (ref 3.5–5.1)
SODIUM: 135 mmol/L (ref 135–145)
Total Bilirubin: 0.5 mg/dL (ref 0.3–1.2)
Total Protein: 7.2 g/dL (ref 6.5–8.1)

## 2015-07-03 LAB — GLUCOSE, CAPILLARY: GLUCOSE-CAPILLARY: 105 mg/dL — AB (ref 65–99)

## 2015-07-03 LAB — I-STAT CG4 LACTIC ACID, ED: LACTIC ACID, VENOUS: 3.93 mmol/L — AB (ref 0.5–2.0)

## 2015-07-03 LAB — MRSA PCR SCREENING: MRSA BY PCR: NEGATIVE

## 2015-07-03 LAB — PROCALCITONIN: Procalcitonin: <0.1 ng/mL

## 2015-07-03 LAB — LIPASE, BLOOD: Lipase: 27 U/L (ref 11–51)

## 2015-07-03 LAB — PHENYTOIN LEVEL, TOTAL: Phenytoin Lvl: <2.5 ug/mL — ABNORMAL LOW (ref 10.0–20.0)

## 2015-07-03 LAB — APTT: aPTT: 27 seconds (ref 24–37)

## 2015-07-03 LAB — PROTIME-INR
INR: 1.06 (ref 0.00–1.49)
Prothrombin Time: 14 seconds (ref 11.6–15.2)

## 2015-07-03 LAB — LACTIC ACID, PLASMA
LACTIC ACID, VENOUS: 2.5 mmol/L — AB (ref 0.5–2.0)
Lactic Acid, Venous: 2.4 mmol/L (ref 0.5–2.0)

## 2015-07-03 MED ORDER — ENOXAPARIN SODIUM 40 MG/0.4ML ~~LOC~~ SOLN
40.0000 mg | SUBCUTANEOUS | Status: DC
  Administered 2015-07-03 – 2015-07-05 (×3): 40 mg via SUBCUTANEOUS
  Filled 2015-07-03 (×2): qty 0.4

## 2015-07-03 MED ORDER — ACETAMINOPHEN 650 MG RE SUPP: 650.0000 mg | Freq: Four times a day (QID) | RECTAL | Status: DC | PRN

## 2015-07-03 MED ORDER — SODIUM CHLORIDE 0.9 % IV BOLUS (SEPSIS)
1000.0000 mL | Freq: Once | INTRAVENOUS | Status: AC
  Administered 2015-07-03: 1000 mL via INTRAVENOUS

## 2015-07-03 MED ORDER — ONDANSETRON HCL 4 MG PO TABS: 4.0000 mg | ORAL_TABLET | Freq: Four times a day (QID) | ORAL | Status: DC | PRN

## 2015-07-03 MED ORDER — ACETAMINOPHEN 325 MG PO TABS
650.0000 mg | ORAL_TABLET | Freq: Four times a day (QID) | ORAL | Status: DC | PRN
  Administered 2015-07-05: 650 mg via ORAL
  Filled 2015-07-03: qty 2

## 2015-07-03 MED ORDER — VANCOMYCIN HCL IN DEXTROSE 1-5 GM/200ML-% IV SOLN
1000.0000 mg | Freq: Two times a day (BID) | INTRAVENOUS | Status: DC
  Filled 2015-07-03: qty 200

## 2015-07-03 MED ORDER — SODIUM CHLORIDE 0.9 % IV SOLN
1000.0000 mg | Freq: Once | INTRAVENOUS | Status: AC
  Administered 2015-07-03: 1000 mg via INTRAVENOUS
  Filled 2015-07-03: qty 10

## 2015-07-03 MED ORDER — ONDANSETRON HCL 4 MG/2ML IJ SOLN: 4.0000 mg | Freq: Four times a day (QID) | INTRAMUSCULAR | Status: DC | PRN

## 2015-07-03 MED ORDER — SODIUM CHLORIDE 0.9 % IV SOLN
1500.0000 mg | Freq: Two times a day (BID) | INTRAVENOUS | Status: AC
  Administered 2015-07-03 – 2015-07-04 (×3): 1500 mg via INTRAVENOUS
  Filled 2015-07-03 (×3): qty 15

## 2015-07-03 MED ORDER — SODIUM CHLORIDE 0.9% FLUSH
3.0000 mL | Freq: Two times a day (BID) | INTRAVENOUS | Status: DC
  Administered 2015-07-03 – 2015-07-04 (×2): 3 mL via INTRAVENOUS

## 2015-07-03 MED ORDER — SODIUM CHLORIDE 0.9 % IV SOLN
200.0000 mg | Freq: Two times a day (BID) | INTRAVENOUS | Status: DC
  Administered 2015-07-03 – 2015-07-04 (×3): 200 mg via INTRAVENOUS
  Filled 2015-07-03 (×6): qty 4

## 2015-07-03 MED ORDER — SODIUM CHLORIDE 0.9 % IV SOLN
INTRAVENOUS | Status: DC
  Administered 2015-07-03 – 2015-07-04 (×3): via INTRAVENOUS

## 2015-07-03 MED ORDER — ACETAMINOPHEN 650 MG RE SUPP
650.0000 mg | Freq: Once | RECTAL | Status: AC
  Administered 2015-07-03: 650 mg via RECTAL
  Filled 2015-07-03: qty 1

## 2015-07-03 MED ORDER — VANCOMYCIN HCL IN DEXTROSE 1-5 GM/200ML-% IV SOLN
1000.0000 mg | Freq: Once | INTRAVENOUS | Status: AC
  Administered 2015-07-03: 1000 mg via INTRAVENOUS
  Filled 2015-07-03: qty 200

## 2015-07-03 MED ORDER — ENOXAPARIN SODIUM 40 MG/0.4ML ~~LOC~~ SOLN: 40.0000 mg | SUBCUTANEOUS | Status: DC

## 2015-07-03 MED ORDER — SODIUM CHLORIDE 0.9 % IV SOLN
100.0000 mg | Freq: Two times a day (BID) | INTRAVENOUS | Status: AC
  Administered 2015-07-04 (×3): 100 mg via INTRAVENOUS
  Filled 2015-07-03 (×5): qty 10

## 2015-07-03 MED ORDER — PIPERACILLIN-TAZOBACTAM 3.375 G IVPB
3.3750 g | Freq: Three times a day (TID) | INTRAVENOUS | Status: DC
  Filled 2015-07-03 (×2): qty 50

## 2015-07-03 MED ORDER — HYDRALAZINE HCL 20 MG/ML IJ SOLN: 5.0000 mg | INTRAMUSCULAR | Status: DC | PRN

## 2015-07-03 MED ORDER — SODIUM CHLORIDE 0.9 % IV SOLN
200.0000 mg | Freq: Once | INTRAVENOUS | Status: AC
  Administered 2015-07-03: 200 mg via INTRAVENOUS
  Filled 2015-07-03: qty 20

## 2015-07-03 MED ORDER — PIPERACILLIN-TAZOBACTAM 3.375 G IVPB 30 MIN
3.3750 g | Freq: Once | INTRAVENOUS | Status: AC
  Administered 2015-07-03: 3.375 g via INTRAVENOUS
  Filled 2015-07-03: qty 50

## 2015-07-03 NOTE — ED Notes (Signed)
Pt speaks [name] per brother with some english.

## 2015-07-03 NOTE — Consult Note (Signed)
Neurology Consultation Reason for Consult: Seizure Referring Physician: Horton, C  CC: Seizure  History is obtained from:patient  HPI: [name] is a 55 y.o. male with a history of seizures due to remote history of head trauma who presents with prolonged seizure occurring earlier tonight. Per his family he has a history of seizures, and has had an increasing frequency over the past few days. In between seizures, has returned to baseline with no headache, fevers per family.   Tonight, he had a seizure that lasted for approximately 30 minutes, finally aborting following 5mg  IV midazolam. He continues to be quite post-ictal but is improving.     ROS:  Unable to obtain due to altered mental status.   Past Medical History  Diagnosis Date  . Hypertension   . Seizures ([place])     due head trauma-seizures are less, but occurs from mild to more severe, which causes agitation to mental state.  . Language barrier 03-01-13    Speaks very little English,primary-Vietnamese "[name] Dialect"  . Depression   . Anxiety   . Back pain     low back pain/ compressed disk in lower back.   [name] TBI (traumatic brain injury) ([place])     CARE FOR BY BROTHER     No family history on file.   Social History:  reports that he quit smoking about 26 years ago. He has never used smokeless tobacco. He reports that he does not drink alcohol or use illicit drugs.   Exam: Current vital signs: BP 157/98 mmHg  Pulse 118  Temp(Src) 102.4 F (39.1 C) (Rectal)  Resp 24  SpO2 99% Vital signs in last 24 hours: Temp:  [102.4 F (39.1 C)] 102.4 F (39.1 C) (05/03 0230) Pulse Rate:  [118-122] 118 (05/03 0330) Resp:  [24] 24 (05/03 0330) BP: (147-157)/(98-105) 157/98 mmHg (05/03 0330) SpO2:  [98 %-99 %] 99 % (05/03 0330)   Physical Exam  Constitutional: Appears well-developed and well-nourished.  Psych: Affect appropriate to situation Eyes: No scleral injection HENT: No OP obstrucion Head: Normocephalic.   Cardiovascular: Normal rate and regular rhythm.  Respiratory: Effort normal and breath sounds normal to anterior ascultation GI: Soft.  No distension. There is no tenderness.  Skin: WDI  Neuro: Mental Status: Patient does not open eyes or follow commands. He keeps eyes tightly closed.  Cranial Nerves: II: Does not blink to threat. Pupils are equal, round, and reactive to light.   III,IV, VI: Eyes are midline, but he does not clearly cross to the left, where as he does to the right.  V:VII: Blinks to eyelid stimulation bilaterally.  VIII: hearing is intact to voice X: Uvula elevates symmetrically XI: Shoulder shrug is symmetric. XII: tongue is midline without atrophy or fasciculations.  Motor: He moves both sides well with good strength. He is shivering when I see him, but is purposeful with bilateral arm movements.  Sensory: Responds to noxious stimulation bilaterally.  Cerebellar: Does not comply.   Impression: 55 yo M with increasing seizure frequency of unclear cause. He is already on high doses of keppra, and unclear what his dilantin levels are. In this setting, will start vimpat and give IV load.   Recommendations: 1) Vimpat 200mg  IV x 1 followed by 100mg  BID 2) dilantin 200mg  BID 3) keppra 1500mg  BID 4) CT head 5) EEG   Roland Rack, MD [facility] [phone]  If 7pm- 7am, please page neurology on call as listed in [place].

## 2015-07-03 NOTE — Progress Notes (Signed)
Pharmacy Antibiotic Note  [name] is a 55 y.o. male admitted on 07/03/2015 with sepsis.  Pharmacy has been consulted for Vancomycin and Zosyn dosing. Estimated CrCl 66 ml/min.  Vanc 1gm and Zosyn 3.375gm given in ED ~0500.  Plan: Zosyn then 3.375gm IV q8h - doses over 4 hours Vancomycin 1gm IV q12h Will f/u micro data, renal function, and pt's clinical condition Vanc trough prn     Temp (24hrs), Avg:101.3 F (38.5 C), Min:100.2 F (37.9 C), Max:102.4 F (39.1 C)   Recent Labs Lab 07/03/15 0440 07/03/15 0443  WBC 17.7*  --   CREATININE 1.27*  --   LATICACIDVEN  --  3.93*    CrCl cannot be calculated (Unknown ideal weight.).    No Known Allergies  Antimicrobials this admission: 5/3 Vanc >>  5/3 Zosyn >>   Dose adjustments this admission: n/a  Microbiology results: 5/3 BCx x2:   UCx:    Thank you for allowing pharmacy to be a part of this patient's care.  Sherlon Handing, PharmD, BCPS Clinical pharmacist, pager [phone] 07/03/2015 6:27 AM

## 2015-07-03 NOTE — ED Notes (Signed)
The  Pt brought in by [facility] from home with a seizure for at least an hour family last saw at 31.  He has a history of seizures.  He  Was in a hot house according to ems.  He was given versed 5mg  by [facility]  No seizures on arrival to the ed.  Iv lt hand   Moans to pain only

## 2015-07-03 NOTE — ED Notes (Signed)
plebotomy at bedside attempting to get blood  Pt is a difficult stick

## 2015-07-03 NOTE — ED Provider Notes (Signed)
CSN: [id number]     Arrival date & time 07/03/15  0317 History   By signing my name below, I, Rowan Blase, attest that this documentation has been prepared under the direction and in the presence of Merryl Hacker, MD . Electronically Signed: Rowan Blase, Scribe. 07/03/2015. 3:30 AM.   Chief Complaint  Patient presents with  . Seizures  Level 5 caveat to do acuity of condition  The history is provided by the EMS personnel. The history is limited by the condition of the patient. No language interpreter was used.   HPI Comments:  [name] is a 55 y.o. male with PMHx of HTN, seizures and TBI who presents to the Emergency Department via EMS s/p seizure just PTA. EMS reports pt had seizure activity with RUE focal activity for at least 30 minutes with associated diaphoresis and fever. Family told EMS pt has never had a seizure of this length or intensity before. Pt was last seen normal ~4.5 hours ago.  Per family, patient has a history of seizures. More frequent recently. Usually lasts for several minutes and he has a history of prolonged ictal phases. No recent illnesses.  Past Medical History  Diagnosis Date  . Hypertension   . Seizures ([place])     due head trauma-seizures are less, but occurs from mild to more severe, which causes agitation to mental state.  . Language barrier 03-01-13    Speaks very little English,primary-Vietnamese "[name] Dialect"  . Depression   . Anxiety   . Back pain     low back pain/ compressed disk in lower back.   [name] TBI (traumatic brain injury) ([place])     CARE FOR BY BROTHER  . CKD (chronic kidney disease), stage II    Past Surgical History  Procedure Laterality Date  . No past surgeries      ? one surgery in [place]-? what, has abdominal scar.  . Colonoscopy N/A 03/17/2013    Procedure: COLONOSCOPY;  Surgeon: [name], MD;  Location: [facility];  Service: Endoscopy;  Laterality: N/A;  . Abdominal surgery     No family history on  file. Social History  Substance Use Topics  . Smoking status: Former Smoker    Quit date: 03/02/1989  . Smokeless tobacco: Never Used  . Alcohol Use: No    Review of Systems  Unable to perform ROS: Acuity of condition   Allergies  Review of patient's allergies indicates no known allergies.  Home Medications   Prior to Admission medications   Medication Sig Start Date End Date Taking? Authorizing Provider  acetaminophen (TYLENOL) 500 MG tablet Take 500 mg by mouth every 6 (six) hours as needed for moderate pain or headache.   Yes Historical Provider, MD  aspirin EC 81 MG tablet Take 81 mg by mouth daily as needed for mild pain.   Yes Historical Provider, MD  cetirizine (ZYRTEC) 10 MG tablet Take 10 mg by mouth daily as needed for allergies.   Yes Historical Provider, MD  fluticasone (FLONASE) 50 MCG/ACT nasal spray Place 1 spray into both nostrils daily as needed for allergies or rhinitis.   Yes Historical Provider, MD  levETIRAcetam (KEPPRA) 1000 MG tablet Take one and one half tabs twice daily Patient taking differently: Take 1,500 mg by mouth 2 (two) times daily.  01/07/15  Yes Dennie Bible, NP  lisinopril (PRINIVIL,ZESTRIL) 10 MG tablet Take 10 mg by mouth daily with breakfast.    Yes Historical Provider, MD  phenytoin (DILANTIN) 100  MG ER capsule Take 2 capsules (200 mg total) by mouth 2 (two) times daily. 01/07/15  Yes Dennie Bible, NP   BP 157/98 mmHg  Pulse 118  Temp(Src) 100.2 F (37.9 C) (Rectal)  Resp 24  SpO2 99% Physical Exam  Constitutional:  Somnolent, minimally arousable, airway intact  HENT:  Head: Normocephalic and atraumatic.  Eyes:  Pupil slightly asymmetric but reactive bilaterally  Neck: Neck supple.  Cardiovascular: Regular rhythm and normal heart sounds.   No murmur heard. Tachycardia  Pulmonary/Chest: Effort normal and breath sounds normal. No respiratory distress. He has no wheezes.  Abdominal: Soft. Bowel sounds are normal. There  is no tenderness. There is no rebound.  Musculoskeletal: He exhibits no edema.  Lymphadenopathy:    He has no cervical adenopathy.  Neurological:  Somnolent, minimally arousable, purposeful with bilateral upper extremities  Skin: Skin is warm and dry.  Psychiatric: He has a normal mood and affect.  Nursing note and vitals reviewed.   ED Course  Procedures   CRITICAL CARE Performed by: Merryl Hacker   Total critical care time: 30 minutes  Critical care time was exclusive of separately billable procedures and treating other patients.  Critical care was necessary to treat or prevent imminent or life-threatening deterioration.  Critical care was time spent personally by me on the following activities: development of treatment plan with patient and/or surrogate as well as nursing, discussions with consultants, evaluation of patient's response to treatment, examination of patient, obtaining history from patient or surrogate, ordering and performing treatments and interventions, ordering and review of laboratory studies, ordering and review of radiographic studies, pulse oximetry and re-evaluation of patient's condition.  DIAGNOSTIC STUDIES:  Oxygen Saturation is 98% on RA, normal by my interpretation.    COORDINATION OF CARE:  3:20 AM Will order chest x-ray, lactic acid, CMP, CBC, blood culture and UA. Will administer vancomycin, acetaminophen, Zosyn and fluids.  3:29 AM Consult to Neurologist.  Labs Review Labs Reviewed  COMPREHENSIVE METABOLIC PANEL - Abnormal; Notable for the following:    CO2 21 (*)    Glucose, Bld 130 (*)    Creatinine, Ser 1.27 (*)    Calcium 7.9 (*)    Albumin 3.4 (*)    All other components within normal limits  CBC WITH DIFFERENTIAL/PLATELET - Abnormal; Notable for the following:    WBC 17.7 (*)    HCT 38.7 (*)    Neutro Abs 15.7 (*)    Monocytes Absolute 1.3 (*)    All other components within normal limits  PHENYTOIN LEVEL, TOTAL -  Abnormal; Notable for the following:    Phenytoin Lvl <2.5 (*)    All other components within normal limits  I-STAT CG4 LACTIC ACID, ED - Abnormal; Notable for the following:    Lactic Acid, Venous 3.93 (*)    All other components within normal limits  CULTURE, BLOOD (ROUTINE X 2)  CULTURE, BLOOD (ROUTINE X 2)  URINE CULTURE  URINALYSIS, ROUTINE W REFLEX MICROSCOPIC (NOT AT [facility])  LIPASE, BLOOD  LACTIC ACID, PLASMA  LACTIC ACID, PLASMA  PROCALCITONIN  PROTIME-INR  APTT    Imaging Review Dg Chest Port 1 View  07/03/2015  CLINICAL DATA:  Code sepsis.  Post seizure. EXAM: PORTABLE CHEST 1 VIEW COMPARISON:  07/08/2003 FINDINGS: Examination is technically limited. The lung apices and left costophrenic angle are excluded from the field of view. Patient is rotated. Patient was confused and combative and this was best positioning available. Heart size and pulmonary vascularity are normal.  Visualize lungs appear clear. No visible pneumothorax or consolidation. IMPRESSION: No evidence of active pulmonary disease on limited imaging. Electronically Signed   By: Lucienne Capers M.D.   On: 07/03/2015 05:12   I have personally reviewed and evaluated these images and lab results as part of my medical decision-making.   EKG Interpretation   Date/Time:  Wednesday Jul 03 2015 03:17:58 EDT Ventricular Rate:  127 PR Interval:  168 QRS Duration: 82 QT Interval:  335 QTC Calculation: 487 R Axis:   21 Text Interpretation:  Sinus tachycardia Probable left atrial enlargement  RSR' in V1 or V2, probably normal variant Borderline repolarization  abnormality Borderline prolonged QT interval Confirmed by Imagene Boss  MD,  Bruin Bolger ([id number]) on 07/03/2015 5:40:51 AM      MDM   Final diagnoses:  Seizures ([place])  Lactic acidosis  Post-ictal state ([place])    Patient presents following a prolonged seizure. He appears to be postictal. EMS reports some right upper extremity jerking in route. He has had one episode  here but otherwise does not appear to be in status epilepticus. Airway is intact. Was noted to have a rectal temp of 102.4. This could be secondary to prolonged seizure activity; however, sepsis workup was initiated. An fluids. Discussed with neurology. Will load with IV Keppra. Dr. Leonel Ramsay has evaluated the patient and also given him a dose of Vimpat. Dilantin level pending. Lactate is elevated to 3.93. Again this could be related to prolonged seizure and/or sepsis. He is getting 30 mL/kg of fluid and got broad-spectrum antibiotics. No evidence of pneumonia. Cultures and urine pending.  5:30AM On recheck, he is now moving about more. He he knows his name. He seems to be improving but is not back to baseline. We'll continue to monitor. Will admit for further workup. Suspect his metabolic derangements and fever are likely secondary to seizure and not the other way around some: However, admission for observation and clearing of mental status is important. CT head will be obtained when patient can lie still. No meningeal signs at this time.  Discussed with Dr. Blaine Hamper.  I personally performed the services described in this documentation, which was scribed in my presence. The recorded information has been reviewed and is accurate.    Merryl Hacker, MD 07/03/15 [phone]

## 2015-07-03 NOTE — ED Notes (Signed)
The pts family just arrived  The pt has been having 5 seizures for the past 2 days.  He has seizures from a head injury he sustained  In the 46s.  He normally walks and talks ok.

## 2015-07-03 NOTE — Progress Notes (Signed)
EEG Completed; Results Pending  

## 2015-07-03 NOTE — ED Notes (Signed)
The pt is  Moving all over the stretcher  All tests delayed including the c-t of his head  Unable to keep him on his back.  Brother is helplng as much as he can

## 2015-07-03 NOTE — H&P (Addendum)
History and Physical    [name] [id number] DOB: 1960-08-02 DOA: 07/03/2015  Referring MD/NP/PA:   PCP: Pcp Not In System   Outpatient Specialists: none  Patient coming from:  Home  Chief Complaint: seizure and fever  HPI: [name] is a 55 y.o. 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  Per pt's care giver, his nonbiological brother, pt has had five seizures in the past 2 days. He had another seizure last night at about 11:00 PM, which lasted for about 30 min, finally resolved after given 5mg  IV midazolam by EMS. Pt is in postictal status when saw pt in ED. He has fever of 102.4. Per his brother, patient does not have respiratory symptoms. No nausea, vomiting, diarrhea or headache. Patient has abdominal tenderness on physical examination. He moves all extremities.  ED Course: pt was found to have subtherapeutic phenytoin level <2.5,  WBC 17.7, lactate is 3.93, temperature 102.4, tachycardia, tachypnea, stable renal function, pending urinalysis, negative chest x-ray for acute abnormalities. Patient is placed to stepdown for observation.  Review of Systems: Could not be reviewed due to post ictal status..  Allergy: No Known Allergies  Past Medical History  Diagnosis Date  . Hypertension   . Seizures ([place])     due head trauma-seizures are less, but occurs from mild to more severe, which causes agitation to mental state.  . Language barrier 03-01-13    Speaks very little English,primary-Vietnamese "[name] Dialect"  . Depression   . Anxiety   . Back pain     low back pain/ compressed disk in lower back.   [name] TBI (traumatic brain injury) ([place])     CARE FOR BY BROTHER  . CKD (chronic kidney disease), stage II     Past Surgical History  Procedure Laterality Date  . No past surgeries      ? one surgery in [place]-? what, has abdominal scar.  . Colonoscopy  N/A 03/17/2013    Procedure: COLONOSCOPY;  Surgeon: [name], MD;  Location: [facility];  Service: Endoscopy;  Laterality: N/A;  . Abdominal surgery      Social History:  reports that he quit smoking about 26 years ago. He has never used smokeless tobacco. He reports that he does not drink alcohol or use illicit drugs.  Family History:  Family History  Problem Relation Age of Onset  . Seizures Mother     per his nonbiological brother     Prior to Admission medications   Medication Sig Start Date End Date Taking? Authorizing Provider  acetaminophen (TYLENOL) 500 MG tablet Take 500 mg by mouth every 6 (six) hours as needed for moderate pain or headache.   Yes Historical Provider, MD  aspirin EC 81 MG tablet Take 81 mg by mouth daily as needed for mild pain.   Yes Historical Provider, MD  cetirizine (ZYRTEC) 10 MG tablet Take 10 mg by mouth daily as needed for allergies.   Yes Historical Provider, MD  fluticasone (FLONASE) 50 MCG/ACT nasal spray Place 1 spray into both nostrils daily as needed for allergies or rhinitis.   Yes Historical Provider, MD  levETIRAcetam (KEPPRA) 1000 MG tablet Take one and one half tabs twice daily Patient taking differently: Take 1,500 mg by mouth 2 (two) times daily.  01/07/15  Yes Dennie Bible, NP  lisinopril (PRINIVIL,ZESTRIL) 10 MG tablet Take 10 mg by mouth daily with breakfast.  Yes Historical Provider, MD  phenytoin (DILANTIN) 100 MG ER capsule Take 2 capsules (200 mg total) by mouth 2 (two) times daily. 01/07/15  Yes Dennie Bible, NP    Physical Exam: Filed Vitals:   07/03/15 0230 07/03/15 0327 07/03/15 0330 07/03/15 0452  BP:  147/105 157/98   Pulse:  122 118   Temp: 102.4 F (39.1 C)   100.2 F (37.9 C)  TempSrc: Rectal     Resp:  24 24   SpO2:  98% 99%    General: Not in acute distress HEENT:       Eyes: PERRL, EOMI, no scleral icterus.       ENT: No discharge from the ears and nose, no pharynx injection, no  tonsillar enlargement.        Neck: No JVD, no bruit, no mass felt. Heme: No neck lymph node enlargement. Cardiac: S1/S2, RRR, Tachycardia. No murmurs, No gallops or rubs. Pulm:  No rales, wheezing, rhonchi or rubs. Abd: Soft, nondistended, has tenderness, no organomegaly, BS present. GU: No hematuria Ext: No pitting leg edema bilaterally. 2+DP/PT pulse bilaterally. Musculoskeletal: No joint deformities, No joint redness or warmth, no limitation of ROM in spin. Skin: No rashes.  Neuro: confused, not oriented X3, cranial nerves II-XII grossly intact, moves all extremities. Neck is supple. Psych: Could not be reviewed due to post ictal status.  Labs on Admission: I have personally reviewed following labs and imaging studies  CBC:  Recent Labs Lab 07/03/15 0440  WBC 17.7*  NEUTROABS 15.7*  HGB 13.0  HCT 38.7*  MCV 89.4  PLT [id number]   Basic Metabolic Panel:  Recent Labs Lab 07/03/15 0440  NA 135  K 3.5  CL 102  CO2 21*  GLUCOSE 130*  BUN 10  CREATININE 1.27*  CALCIUM 7.9*   GFR: CrCl cannot be calculated (Unknown ideal weight.). Liver Function Tests:  Recent Labs Lab 07/03/15 0440  AST 38  ALT 24  ALKPHOS 70  BILITOT 0.5  PROT 7.2  ALBUMIN 3.4*   No results for input(s): LIPASE, AMYLASE in the last 168 hours. No results for input(s): AMMONIA in the last 168 hours. Coagulation Profile: No results for input(s): INR, PROTIME in the last 168 hours. Cardiac Enzymes: No results for input(s): CKTOTAL, CKMB, CKMBINDEX, TROPONINI in the last 168 hours. BNP (last 3 results) No results for input(s): PROBNP in the last 8760 hours. HbA1C: No results for input(s): HGBA1C in the last 72 hours. CBG: No results for input(s): GLUCAP in the last 168 hours. Lipid Profile: No results for input(s): CHOL, HDL, LDLCALC, TRIG, CHOLHDL, LDLDIRECT in the last 72 hours. Thyroid Function Tests: No results for input(s): TSH, T4TOTAL, FREET4, T3FREE, THYROIDAB in the last 72  hours. Anemia Panel: No results for input(s): VITAMINB12, FOLATE, FERRITIN, TIBC, IRON, RETICCTPCT in the last 72 hours. Urine analysis:    Component Value Date/Time   COLORURINE YELLOW 08/04/2012 1429   APPEARANCEUR CLEAR 08/04/2012 1429   LABSPEC 1.015 08/04/2012 1429   PHURINE 6.5 08/04/2012 1429   GLUCOSEU NEGATIVE 08/04/2012 1429   HGBUR NEGATIVE 08/04/2012 1429   BILIRUBINUR NEGATIVE 08/04/2012 1429   KETONESUR NEGATIVE 08/04/2012 1429   PROTEINUR NEGATIVE 08/04/2012 1429   UROBILINOGEN 0.2 08/04/2012 1429   NITRITE NEGATIVE 08/04/2012 1429   LEUKOCYTESUR NEGATIVE 08/04/2012 1429   Sepsis Labs: @LABRCNTIP (procalcitonin:4,lacticidven:4) )No results found for this or any previous visit (from the past 240 hour(s)).   Radiological Exams on Admission: Dg Chest Port 1 View  07/03/2015  CLINICAL DATA:  Code sepsis.  Post seizure. EXAM: PORTABLE CHEST 1 VIEW COMPARISON:  07/08/2003 FINDINGS: Examination is technically limited. The lung apices and left costophrenic angle are excluded from the field of view. Patient is rotated. Patient was confused and combative and this was best positioning available. Heart size and pulmonary vascularity are normal. Visualize lungs appear clear. No visible pneumothorax or consolidation. IMPRESSION: No evidence of active pulmonary disease on limited imaging. Electronically Signed   By: Lucienne Capers M.D.   On: 07/03/2015 05:12     EKG: Independently reviewed. QTC 487, tachycardia.  Assessment/Plan Principal Problem:   Seizure ([place]) Active Problems:   HTN (hypertension)   Seizure ([place])   Depression   Sepsis ([place])   CKD (chronic kidney disease), stage II   Seizure ([place]): likely due to medication non-compliance given his supratherapeutic phenytoin level. Neuro was consulted. Dr . Leonel Ramsay saw patient.   -will place to SDU for obs -appreciated Dr. Cecil Cobbs consultation, with follow-up recommendations as follows:  1) Vimpat 200mg  IV x  1 followed by 100mg  BID  2) dilantin 200mg  BID  3) keppra 1500mg  BID  4) CT head  5) EEG -Frequent neuro check  Sepsis vs. SIRS: Patient meet criteria for sepsis with leukocytosis, elevated lactate, fever, tachycardia and tachypnea. His leukocytosis, fever and elevated lactate could be explained, at least partially by seizure, but cannot completely rule out infection. Chest x-ray is negative. Pending urinalysis. Patient has tenderness over abdomen on physical examination, indicating possible intra-abdominal process going on. -IV vancomycin and Zosyn was started in the emergency room, will continue. -will get Procalcitonin and trend lactic acid levels per sepsis protocol. -IVF: 3L of NS bolus in ED, followed by 100 cc/h -f/u CT-abd/pelvis, UA, Ux and Bx -lipase  Abdominal tenderness: -see above  HTN: -Hold oral lisinopril -IV hydralazine prn  Depression: not on medications at home. -Observe closely  CKD (chronic kidney disease), stage II: stable. Baseline creatinine 1.0-1.36 in the past. His creatinine is 1.27, BUN 10. -Follow-up renal function by BMP   DVT ppx:  SQ Lovenox Code Status: Full code Family Communication: Yes, patient's nonbiological brother, care giver, Mr. Davis Gourd [phone]) at bed side Disposition Plan:  Anticipate discharge back to previous home environment Consults called:  Neuro, Dr. Leonel Ramsay Admission status: SDU/obs     Date of Service 07/03/2015    Ivor Costa [facility] Pager [phone]  If 7PM-7AM, please contact night-coverage [email or web address] Password [email or web address] 07/03/2015, 7:00 AM

## 2015-07-03 NOTE — ED Notes (Signed)
Neurologist at the bedside 

## 2015-07-03 NOTE — Procedures (Signed)
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 The patient has a history of chronic renal disease, presents with a seizure event. The family reports that he has had 5 seizures in the last 2 days. The last seizure prior to admission lasted for about 30 minutes. The patient is noted have a fever 102.4. Dilantin level was found to be subtherapeutic at less than 2.5.  This is a routine EEG. No skull defects are noted. Medications include Tylenol, Vimpat, Keppra Lovenox, hydralazine Zofran, Zosyn, and vancomycin.  EEG classification: Dysrhythmia grade 1 generalized  Description of the recording: The background rhythms of this recording consists of a moderately well modulated medium amplitude background rhythm of 7-8 Hz that is reactive. As the record progresses, photic stimulation and hyperventilation were not performed. Throughout the recording, there appears to be symmetric mild background slowing, unassociated with spike or spike wave discharges or evidence of focal slowing. EKG monitor is not well-defined during the recording but appears to show a heart rate of approximately 108. No definite cardiac rhythm abnormalities are seen.  Impression: This is a mildly abnormal EEG recording secondary to slight background theta frequency slowing. This is a nonspecific recording and can be seen with any process that results in a mild toxic or metabolic encephalopathy, or any dementing type illness. No evidence of epileptiform discharges are seen during this recording.

## 2015-07-03 NOTE — Progress Notes (Signed)
  [facility] TEAM 1 - Stepdown/ICU TEAM PROGRESS NOTE  [name] [id number] DOB: [birth year] DOA: 07/03/2015 PCP: Pcp Not In System  Admit HPI / Brief Narrative: 55 y.o. 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  Per pt's caregiver he had five seizures over 2 days, the las of which lasted for about 30 min and resolved only after given 5mg  IV midazolam by EMS.   In the ED he was found to have subtherapeutic phenytoin level <2.5, WBC 17.7, lactate is 3.93, temperature 102.4, tachycardia, tachypnea, and a negative chest x-ray for acute abnormalities.   HPI/Subjective: Pt seen for f/u visit.  Assessment/Plan:  Uncontrolled Seizure d/o - multiple Szs  SIRS  Abdominal pain  HTN  Depression  CKD stage II Baseline creatinine 1.0-1.36   Code Status: FULL Family Communication: no family present at time of exam Disposition Plan: SDU  Consultants: Neurology   Procedures: none  Antibiotics: Zosyn 5/2 > Vanc 5/2 >  DVT prophylaxis: SQ lovenox   Objective: Blood pressure 126/71, pulse 105, temperature 99.1 F (37.3 C), temperature source Oral, resp. rate 22, SpO2 95 %.  Intake/Output Summary (Last 24 hours) at 07/03/15 1159 Last data filed at 07/03/15 1034  Gross per 24 hour  Intake   2200 ml  Output   1000 ml  Net   1200 ml    Exam: Pt seen for f/u visit.  Data Reviewed: Basic Metabolic Panel:  Recent Labs Lab 07/03/15 0440  NA 135  K 3.5  CL 102  CO2 21*  GLUCOSE 130*  BUN 10  CREATININE 1.27*  CALCIUM 7.9*    CBC:  Recent Labs Lab 07/03/15 0440  WBC 17.7*  NEUTROABS 15.7*  HGB 13.0  HCT 38.7*  MCV 89.4  PLT 325    Liver Function Tests:  Recent Labs Lab 07/03/15 0440  AST 38  ALT 24  ALKPHOS 70  BILITOT 0.5  PROT 7.2  ALBUMIN 3.4*    Recent Labs Lab 07/03/15 0754  LIPASE 27   Coags:  Recent Labs Lab 07/03/15 0754  INR 1.06     Recent Labs Lab 07/03/15 0754  APTT 27   CBG:  Recent Labs Lab 07/03/15 0917  GLUCAP 105*    Studies:   Recent x-ray studies have been reviewed in detail by the Attending Physician  Scheduled Meds:  Scheduled Meds: . enoxaparin (LOVENOX) injection  40 mg Subcutaneous Q24H  . lacosamide (VIMPAT) IV  100 mg Intravenous Q12H  . levETIRAcetam  1,500 mg Intravenous Q12H  . phenytoin (DILANTIN) IV  200 mg Intravenous BID  . piperacillin-tazobactam (ZOSYN)  IV  3.375 g Intravenous Q8H  . sodium chloride flush  3 mL Intravenous Q12H  . vancomycin  1,000 mg Intravenous Q12H    Time spent on care of this patient: No charge   Cherene Altes , MD   [facility] Office  [phone] Pager - Text Page per Shea Evans as per below:  On-Call/Text Page:      [email or web address]      password [email or web address]  If 7PM-7AM, please contact night-coverage [email or web address] Password [email or web address] 07/03/2015, 11:59 AM

## 2015-07-04 DIAGNOSIS — R569 Unspecified convulsions: Secondary | ICD-10-CM

## 2015-07-04 LAB — COMPREHENSIVE METABOLIC PANEL
ALBUMIN: 2.9 g/dL — AB (ref 3.5–5.0)
ALK PHOS: 60 U/L (ref 38–126)
ALT: 22 U/L (ref 17–63)
ANION GAP: 11 (ref 5–15)
AST: 40 U/L (ref 15–41)
BILIRUBIN TOTAL: 0.6 mg/dL (ref 0.3–1.2)
BUN: 11 mg/dL (ref 6–20)
CALCIUM: 8.7 mg/dL — AB (ref 8.9–10.3)
CO2: 24 mmol/L (ref 22–32)
Chloride: 107 mmol/L (ref 101–111)
Creatinine, Ser: 1.23 mg/dL (ref 0.61–1.24)
GFR calc Af Amer: >60 mL/min (ref >60)
GLUCOSE: 115 mg/dL — AB (ref 65–99)
Potassium: 3.4 mmol/L — ABNORMAL LOW (ref 3.5–5.1)
Sodium: 142 mmol/L (ref 135–145)
TOTAL PROTEIN: 6.5 g/dL (ref 6.5–8.1)

## 2015-07-04 LAB — CBC
HEMATOCRIT: 35.6 % — AB (ref 39.0–52.0)
HEMOGLOBIN: 11.8 g/dL — AB (ref 13.0–17.0)
MCH: 30.7 pg (ref 26.0–34.0)
MCHC: 33.1 g/dL (ref 30.0–36.0)
MCV: 92.7 fL (ref 78.0–100.0)
Platelets: 283 10*3/uL (ref 150–400)
RBC: 3.84 MIL/uL — ABNORMAL LOW (ref 4.22–5.81)
RDW: 13.6 % (ref 11.5–15.5)
WBC: 8.4 10*3/uL (ref 4.0–10.5)

## 2015-07-04 LAB — GLUCOSE, CAPILLARY: Glucose-Capillary: 101 mg/dL — ABNORMAL HIGH (ref 65–99)

## 2015-07-04 LAB — URINE CULTURE: Culture: NO GROWTH

## 2015-07-04 LAB — CK: Total CK: 942 U/L — ABNORMAL HIGH (ref 49–397)

## 2015-07-04 MED ORDER — LACOSAMIDE 50 MG PO TABS
100.0000 mg | ORAL_TABLET | Freq: Two times a day (BID) | ORAL | Status: DC
  Administered 2015-07-05 – 2015-07-06 (×3): 100 mg via ORAL
  Filled 2015-07-04 (×3): qty 2

## 2015-07-04 MED ORDER — PHENYTOIN SODIUM 50 MG/ML IJ SOLN
100.0000 mg | Freq: Two times a day (BID) | INTRAMUSCULAR | Status: AC
  Administered 2015-07-04: 100 mg via INTRAVENOUS
  Filled 2015-07-04 (×2): qty 2

## 2015-07-04 MED ORDER — POTASSIUM CHLORIDE CRYS ER 20 MEQ PO TBCR
40.0000 meq | EXTENDED_RELEASE_TABLET | Freq: Once | ORAL | Status: AC
  Administered 2015-07-04: 40 meq via ORAL
  Filled 2015-07-04: qty 2

## 2015-07-04 MED ORDER — PHENYTOIN SODIUM EXTENDED 100 MG PO CAPS
200.0000 mg | ORAL_CAPSULE | Freq: Two times a day (BID) | ORAL | Status: DC
  Administered 2015-07-05 – 2015-07-06 (×3): 200 mg via ORAL
  Filled 2015-07-04 (×3): qty 2

## 2015-07-04 MED ORDER — LEVETIRACETAM 750 MG PO TABS
1500.0000 mg | ORAL_TABLET | Freq: Two times a day (BID) | ORAL | Status: DC
  Administered 2015-07-05 – 2015-07-06 (×3): 1500 mg via ORAL
  Filled 2015-07-04 (×3): qty 2

## 2015-07-04 NOTE — Progress Notes (Signed)
[place] TEAM 1 - Stepdown/ICU TEAM PROGRESS NOTE  [name] [id number] DOB: May 19, 1960 DOA: 07/03/2015 PCP: Pcp Not In System  Admit HPI / Brief Narrative: 55 y.o. 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  Per pt's caregiver he had five seizures over 2 days, the last of which lasted for about 30 min and resolved only after given 5mg  IV midazolam by EMS.   In the ED he was found to have subtherapeutic phenytoin level <2.5, WBC 17.7, lactate is 3.93, temperature 102.4, tachycardia, tachypnea, and a negative chest x-ray for acute abnormalities.    HPI/Subjective: The pt is more alert, but remains somewhat confused.  His RN attempted to speak to him using an interpreter, but he would not provide much information and this was felt to be due to confusion.  He does not appear to be uncomfortable or in any acute distress.    Assessment/Plan:  Uncontrolled Seizure d/o - multiple breakthrough Szs meds have been adjusted per Neuro suggestions - of note, pt was clearly not taking his meds at home as his admit dilatin level was essentially 0   SIRS No evidence of acute infection - SIRS physiology has resolved - due to Sz w/ DH  Mild hypokalemia Replace and recheck in AM    Abdominal pain Appears to have resolved - imaging unremarkable   HTN BP currently well controlled   Depression  CKD stage II Baseline creatinine 1.0-1.36 - appears stable at this time   Recent Labs Lab 07/03/15 0440 07/04/15 0253  CREATININE 1.27* 1.23    Code Status: FULL Family Communication: no family present at time of exam Disposition Plan: transfer to neuro med bed - PT/OT - follow mental status   Consultants: Neurology   Procedures: none  Antibiotics: Zosyn 5/2  Vanc 5/2   DVT prophylaxis: SQ lovenox   Objective: Blood pressure 106/64, pulse 88, temperature 98.3 F (36.8 C), temperature source  Oral, resp. rate 21, SpO2 96 %.  Intake/Output Summary (Last 24 hours) at 07/04/15 1337 Last data filed at 07/04/15 1300  Gross per 24 hour  Intake 3611.67 ml  Output    950 ml  Net 2661.67 ml    Exam: General: No acute respiratory distress Lungs: Clear to auscultation bilaterally without wheezes or crackles Cardiovascular: Regular rate and rhythm without murmur gallop or rub normal S1 and S2 Abdomen: Nontender, nondistended, soft, bowel sounds positive, no rebound, no ascites, no appreciable mass Extremities: No significant cyanosis, clubbing, or edema bilateral lower extremities   Data Reviewed: Basic Metabolic Panel:  Recent Labs Lab 07/03/15 0440 07/04/15 0253  NA 135 142  K 3.5 3.4*  CL 102 107  CO2 21* 24  GLUCOSE 130* 115*  BUN 10 11  CREATININE 1.27* 1.23  CALCIUM 7.9* 8.7*    CBC:  Recent Labs Lab 07/03/15 0440 07/04/15 0253  WBC 17.7* 8.4  NEUTROABS 15.7*  --   HGB 13.0 11.8*  HCT 38.7* 35.6*  MCV 89.4 92.7  PLT 325 283    Liver Function Tests:  Recent Labs Lab 07/03/15 0440 07/04/15 0253  AST 38 40  ALT 24 22  ALKPHOS 70 60  BILITOT 0.5 0.6  PROT 7.2 6.5  ALBUMIN 3.4* 2.9*    Recent Labs Lab 07/03/15 0754  LIPASE 27   Coags:  Recent Labs Lab 07/03/15 0754  INR 1.06    Recent Labs Lab 07/03/15 0754  APTT 27  CBG:  Recent Labs Lab 07/03/15 0917 07/04/15 0824  GLUCAP 105* 101*    Studies:   Recent x-ray studies have been reviewed in detail by the Attending Physician  Scheduled Meds:  Scheduled Meds: . enoxaparin (LOVENOX) injection  40 mg Subcutaneous Q24H  . lacosamide (VIMPAT) IV  100 mg Intravenous Q12H  . levETIRAcetam  1,500 mg Intravenous Q12H  . phenytoin (DILANTIN) IV  200 mg Intravenous BID  . sodium chloride flush  3 mL Intravenous Q12H    Time spent on care of this patient: 35 mins   [name] T , MD   [facility] Office  [phone] Pager - Text Page per Shea Evans as per  below:  On-Call/Text Page:      [email or web address]      password [email or web address]  If 7PM-7AM, please contact night-coverage [email or web address] Password [email or web address] 07/04/2015, 1:37 PM   LOS: 1 day

## 2015-07-04 NOTE — Progress Notes (Signed)
Miscommunication regarding vimpat and dilantin. Called pharmacy at 2300 because 2200 dose of dilantin and vimpat missing. Informed they were currently being mixed and was given a code to receive them. None arrived and rechecked both pyxis and bins. Called pharmacy again at 0100 and was informed that the vimpat had been sent and received at 2310. Rechecked all locations and called back at 0222. Pharmacy informed me that there must have been a miscommunication. The medication had not yet been mixed. Both medications were mixed and sent by 0240.

## 2015-07-04 NOTE — Care Management Note (Signed)
Case Management Note  Patient Details  Name: [name] MRN: [id number] Date of Birth: [birth year]  Subjective/Objective:      Adm w seizures  Action/Plan: lives at home w caregiver mr washington   Expected Discharge Date:                  Expected Discharge Plan:  Home/Self Care  In-House Referral:     Discharge planning Services  CM Consult  Post Acute Care Choice:    Choice offered to:     DME Arranged:    DME Agency:     HH Arranged:    [place] Agency:     Status of Service:     Medicare Important Message Given:    Date Medicare IM Given:    Medicare IM give by:    Date Additional Medicare IM Given:    Additional Medicare Important Message give by:     If discussed at [place] of Stay Meetings, dates discussed:    Additional Comments: spoke w mr washington caregiver. They have been managing at home. No hx of hhc. [name] will call me if they feel they need anything. Will cont to follow  Lacretia Leigh, RN 07/04/2015, 2:26 PM

## 2015-07-04 NOTE — Progress Notes (Signed)
Patient trasfered from [facility] to [phone] via wheelchair; alert and oriented x 4; no complaints of pain; IV saline locked in LAC and LH; skin intact. Patient was placed on seizure precaution per MD order. Orient patient to room and unit; instructed how to use the call bell and  fall risk precautions. Will continue to monitor the patient.

## 2015-07-04 NOTE — Evaluation (Signed)
Physical Therapy Evaluation and Discharge Patient Details Name: Valeriano Colt MRN: [id number] DOB: 1960-10-22 Today's Date: 07/04/2015   History of Present Illness  55 y.o. 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 Noted RN had recently tried phone interpreter with no success (due to ?confusion). Patient followed gestures and visual cues without difficulty. No imbalance detected with testing. Patient appears to be at his baseline. PT is signing off. Thank you for this referral.     Follow Up Recommendations No PT follow up    Equipment Recommendations  None recommended by PT    Recommendations for Other Services       Precautions / Restrictions Precautions Precautions: Other (comment) Precaution Comments: seizure      Mobility  Bed Mobility Overal bed mobility: Independent                Transfers Overall transfer level: Independent Equipment used: None                Ambulation/Gait Ambulation/Gait assistance: Supervision Ambulation Distance (Feet): 200 Feet Assistive device: None Gait Pattern/deviations: WFL(Within Functional Limits) (slight limp)   Gait velocity interpretation: at or above normal speed for age/gender General Gait Details: supervision due to IV pole, cardiac monitor  Stairs            Wheelchair Mobility    Modified Rankin (Stroke Patients Only)       Balance Overall balance assessment: Independent Sitting-balance support: No upper extremity supported;Feet supported Sitting balance-Leahy Scale: Normal Sitting balance - Comments: bending to don socks   Standing balance support: No upper extremity supported Standing balance-Leahy Scale: Good           [place] - Eyes  Opened: 30 [place] - Eyes Closed: 10 (no incr sway--did not understand to continue >10 sec) High level balance activites: Head turns;Turns High Level Balance Comments: reaching outside BOS, no deficits or imbalance             Pertinent Vitals/Pain HR 87-90, no dyspnea  Pain Assessment: Faces Faces Pain Scale: No hurt    Home Living Family/patient expects to be discharged to:: Private residence Living Arrangements: Non-relatives/Friends               Additional Comments: per chart, he has a caregiver "Mr. Sweetser"    Prior Function           Comments: unknown     Hand Dominance        Extremity/Trunk Assessment   Upper Extremity Assessment: Overall WFL for tasks assessed           Lower Extremity Assessment: Overall WFL for tasks assessed      Cervical / Trunk Assessment: Normal  Communication   Communication: Prefers language other than English (Per chart, RN attempted interpreter ~1 hr ago & pt confused)  Cognition Arousal/Alertness: Awake/alert Behavior During Therapy: WFL for tasks assessed/performed (smiling) Overall Cognitive Status:  (able to follow gestures, visual cues)                      General Comments General comments (skin integrity, edema, etc.): no family present    Exercises        Assessment/Plan    PT Assessment Patent does not need any further PT services  PT  Diagnosis Altered mental status   PT Problem List    PT Treatment Interventions     PT Goals (Current goals can be found in the Care Plan section) Acute Rehab PT Goals Patient Stated Goal: unable to state PT Goal Formulation: All assessment and education complete, DC therapy    Frequency     Barriers to discharge        Co-evaluation               End of Session Equipment Utilized During Treatment: Gait belt Activity Tolerance: Patient tolerated treatment well Patient left: in bed;with call bell/phone within reach Nurse  Communication: Other (comment) (attempted to call with no answer)         Time: [id number] PT Time Calculation (min) (ACUTE ONLY): 11 min   Charges:   PT Evaluation $PT Eval Low Complexity: 1 Procedure     PT G Codes:        Mariadejesus Cade 07/22/2015, 3:55 PM Pager [phone]

## 2015-07-04 NOTE — Progress Notes (Signed)
Subjective: No further seizures. More awake today  Exam: Filed Vitals:   07/04/15 0800 07/04/15 0821  BP:  103/73  Pulse: 91 85  Temp:  98 F (36.7 C)  Resp: 18 21       Gen: In bed, NAD MS: Alert and oriented CN: Left eye exotropic (normal), EOMI, TML, face symmetrical Motor: MAEW Sensory: Intact Through DTR: intact through  Pertinent Labs/ disgnostics: Impression EEG: This is a mildly abnormal EEG recording secondary to slight background theta frequency slowing. This is a nonspecific recording and can be seen with any process that results in a mild toxic or metabolic encephalopathy, or any dementing type illness. No evidence of epileptiform discharges are seen during this recording.    Etta Quill PA-C [facility] [phone]  Impression: break through seizures now controlled on increased Keppra and adding Vimpat.    Recommendations: 1) Continue Current AED regime. Neurology will S/O    07/04/2015, 9:31 AM

## 2015-07-04 NOTE — Progress Notes (Signed)
Report called to 5W RN. Pt and caregiver made aware of transfer. Will transfer pt and belongings shortly.

## 2015-07-05 DIAGNOSIS — R1013 Epigastric pain: Secondary | ICD-10-CM

## 2015-07-05 LAB — BASIC METABOLIC PANEL
Anion gap: 11 (ref 5–15)
BUN: 12 mg/dL (ref 6–20)
CHLORIDE: 107 mmol/L (ref 101–111)
CO2: 25 mmol/L (ref 22–32)
CREATININE: 0.96 mg/dL (ref 0.61–1.24)
Calcium: 8.7 mg/dL — ABNORMAL LOW (ref 8.9–10.3)
GFR calc Af Amer: >60 mL/min (ref >60)
GFR calc non Af Amer: >60 mL/min (ref >60)
Glucose, Bld: 99 mg/dL (ref 65–99)
Potassium: 3.8 mmol/L (ref 3.5–5.1)
Sodium: 143 mmol/L (ref 135–145)

## 2015-07-05 LAB — T4, FREE: Free T4: 0.98 ng/dL (ref 0.61–1.12)

## 2015-07-05 LAB — TSH: TSH: 1.326 u[IU]/mL (ref 0.350–4.500)

## 2015-07-05 LAB — CK: Total CK: 498 U/L — ABNORMAL HIGH (ref 49–397)

## 2015-07-05 NOTE — Progress Notes (Addendum)
Requested Benefit check for Vimpat 100 PO BID. 2 week trial sticker that is to be placed on Rx on front of chart. Dr Allyson Sabal made aware of sticker.  Addendum 07/05/15    Vimpat 100mg  BID is a Tier 4, Copay is estimated at $8.25. Prior Authorization is not needed.

## 2015-07-05 NOTE — Progress Notes (Signed)
[place] TEAM 1 - Stepdown/ICU TEAM PROGRESS NOTE  Shonna Chock [id number] DOB: August 17, 1960 DOA: 07/03/2015 PCP: Pcp Not In System  Admit HPI / Brief Narrative: 55 y.o. 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  Per pt's caregiver he had five seizures over 2 days, the last of which lasted for about 30 min and resolved only after given 5mg  IV midazolam by EMS.   In the ED he was found to have subtherapeutic phenytoin level <2.5, WBC 17.7, lactate is 3.93, temperature 102.4, tachycardia, tachypnea, and a negative chest x-ray for acute abnormalities.    HPI/Subjective: The pt is more alert, but remains somewhat confused.  Patient able to communicate in broken English  Assessment/Plan:  Uncontrolled Seizure d/o - multiple breakthrough Szs meds have been adjusted per Neuro suggestions - of note, pt was clearly not taking his meds at home as his admit dilatin level was essentially 0 . Repeat Dilantin levels As per  neurology 1) Vimpat 200mg  IV x 1 followed by 100mg  BID 2) dilantin 200mg  BID-repeat Dilantin levels 3) keppra 1500mg  BID Need case management evaluation to see the patient can afford his medications No PT needs  Suppressed TSH in 2014 Recheck thyroid function tests  SIRS No evidence of acute infection - SIRS physiology has resolved - due to Sz w/ [facility] Antibiotics discontinued  Mild hypokalemia Replace and recheck in AM    Abdominal pain Appears to have resolved - imaging unremarkable   HTN BP currently well controlled   Depression  CKD stage II Baseline creatinine 1.0-1.36 - appears stable at this time   Recent Labs Lab 07/03/15 0440 07/04/15 0253 07/05/15 [id number]  CREATININE 1.27* 1.23 0.96    Code Status: FULL Family Communication: no family present at time of exam Disposition Plan: Anticipate discharge tomorrow, patient states that he lives alone and does not have  anybody to take care of him at home, his friend has gone to [facility] and will come back tomorrow  Consultants: Neurology   Procedures: none  Antibiotics: Zosyn 5/2  Vanc 5/2   DVT prophylaxis: SQ lovenox   Objective: Blood pressure 137/81, pulse 82, temperature 98.6 F (37 C), temperature source Oral, resp. rate 20, height 5\' 4"  (1.626 m), weight 85.2 kg (187 lb 13.3 oz), SpO2 96 %.  Intake/Output Summary (Last 24 hours) at 07/05/15 0847 Last data filed at 07/05/15 0436  Gross per 24 hour  Intake   1714 ml  Output   1050 ml  Net    664 ml    Exam: General: No acute respiratory distress Lungs: Clear to auscultation bilaterally without wheezes or crackles Cardiovascular: Regular rate and rhythm without murmur gallop or rub normal S1 and S2 Abdomen: Nontender, nondistended, soft, bowel sounds positive, no rebound, no ascites, no appreciable mass Extremities: No significant cyanosis, clubbing, or edema bilateral lower extremities   Data Reviewed: Basic Metabolic Panel:  Recent Labs Lab 07/03/15 0440 07/04/15 0253 07/05/15 0632  NA 135 142 143  K 3.5 3.4* 3.8  CL 102 107 107  CO2 21* 24 25  GLUCOSE 130* 115* 99  BUN 10 11 12   CREATININE 1.27* 1.23 0.96  CALCIUM 7.9* 8.7* 8.7*    CBC:  Recent Labs Lab 07/03/15 0440 07/04/15 0253  WBC 17.7* 8.4  NEUTROABS 15.7*  --   HGB 13.0 11.8*  HCT 38.7* 35.6*  MCV 89.4 92.7  PLT 325 283    Liver Function Tests:  Recent Labs Lab 07/03/15 0440 07/04/15 0253  AST 38 40  ALT 24 22  ALKPHOS 70 60  BILITOT 0.5 0.6  PROT 7.2 6.5  ALBUMIN 3.4* 2.9*    Recent Labs Lab 07/03/15 0754  LIPASE 27   Coags:  Recent Labs Lab 07/03/15 0754  INR 1.06    Recent Labs Lab 07/03/15 0754  APTT 27   CBG:  Recent Labs Lab 07/03/15 0917 07/04/15 0824  GLUCAP 105* 101*    Studies:   Recent x-ray studies have been reviewed in detail by the Attending Physician  Scheduled Meds:  Scheduled Meds: .  enoxaparin (LOVENOX) injection  40 mg Subcutaneous Q24H  . lacosamide  100 mg Oral BID  . levETIRAcetam  1,500 mg Oral BID  . phenytoin  200 mg Oral BID    Time spent on care of this patient: 45 mins   Reyne Dumas , MD   [facility] Office  [phone] Pager - Text Page per Shea Evans as per below:  On-Call/Text Page:      [email or web address]      password [email or web address]  If 7PM-7AM, please contact night-coverage [email or web address] Password [email or web address] 07/05/2015, 8:47 AM   LOS: 2 days

## 2015-07-05 NOTE — Evaluation (Addendum)
Occupational Therapy Evaluation Patient Details Name: Marquita Siskind MRN: [id number] DOB: 1960-05-20 Today's Date: 07/05/2015    History of Present Illness 55 y.o. 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 All education has been completed and the patient has no further questions. Pt is independent with ADLs.   See below for any follow-up Occupational Therapy or equipment needs. OT is signing off. Thank you for this referral.  Attempted to utilize interpreter line, however, pt speaks [name] and that language (it's a regional dialect) does not have interpreters available.  Pt was able to understand minimal [place].   He is able to perform basic ADLs independently.  Unable to assess cognition due to language barrier and no family available, but pt appropriate, followed gestural cues well, and demonstrated good safety awareness.       Follow Up Recommendations  No OT follow up    Equipment Recommendations  None recommended by OT    Recommendations for Other Services       Precautions / Restrictions Precautions Precautions: Other (comment) Precaution Comments: seizure      Mobility Bed Mobility Overal bed mobility: Independent                Transfers Overall transfer level: Independent                    Balance Overall balance assessment: Independent     Sitting balance - Comments: Pt able to retrieve items from floor without LOB                                     ADL Overall ADL's : Independent                                       General ADL Comments: Pt able to perform ADLs independently      Vision Additional Comments: unable to test due to language  barrier, but pt able to locate all needed items in the room without diffciulty    Perception Perception Perception Tested?: Yes   Praxis Praxis Praxis tested?: Within functional limits    Pertinent Vitals/Pain Pain Assessment: Faces Faces Pain Scale: No hurt     Hand Dominance Right   Extremity/Trunk Assessment Upper Extremity Assessment Upper Extremity Assessment: Overall WFL for tasks assessed   Lower Extremity Assessment Lower Extremity Assessment: Overall WFL for tasks assessed   Cervical / Trunk Assessment Cervical / Trunk Assessment: Normal   Communication Communication Communication: Prefers language other than English ([place] )   Cognition Arousal/Alertness: Awake/alert Behavior During Therapy: WFL for tasks assessed/performed Overall Cognitive Status: No family/caregiver present to determine baseline cognitive functioning (pt with h/o TBI)                     General Comments       Exercises       Shoulder Instructions      Home Living Family/patient expects to be discharged to:: Private residence Living Arrangements: Non-relatives/Friends  Additional Comments: per chart, he has a caregiver "Mr. [place]"      Prior Functioning/Environment Level of Independence: Independent        Comments: Pt unable to provide info due to language barrier     OT Diagnosis:     OT Problem List:     OT Treatment/Interventions:      OT Goals(Current goals can be found in the care plan section) Acute Rehab OT Goals Patient Stated Goal: unable to state OT Goal Formulation: All assessment and education complete, DC therapy  OT Frequency:     Barriers to D/C:            Co-evaluation              End of Session Nurse Communication: Mobility status;Other (comment) (that pt speaks [place] )  Activity Tolerance: Patient tolerated treatment well Patient left: in bed;with call bell/phone within  reach;with bed alarm set   Time: 1335-1356 OT Time Calculation (min): 21 min Charges:  OT General Charges $OT Visit: 1 Procedure OT Evaluation $OT Eval Low Complexity: 1 Procedure G-Codes:    Lucille Passy M 08/03/15, 6:38 PM

## 2015-07-06 LAB — COMPREHENSIVE METABOLIC PANEL
ALBUMIN: 2.8 g/dL — AB (ref 3.5–5.0)
ALT: 19 U/L (ref 17–63)
AST: 25 U/L (ref 15–41)
Alkaline Phosphatase: 65 U/L (ref 38–126)
Anion gap: 14 (ref 5–15)
BUN: 15 mg/dL (ref 6–20)
CHLORIDE: 106 mmol/L (ref 101–111)
CO2: 23 mmol/L (ref 22–32)
CREATININE: 1.18 mg/dL (ref 0.61–1.24)
Calcium: 8.4 mg/dL — ABNORMAL LOW (ref 8.9–10.3)
GFR calc non Af Amer: >60 mL/min (ref >60)
GLUCOSE: 114 mg/dL — AB (ref 65–99)
Potassium: 3.5 mmol/L (ref 3.5–5.1)
SODIUM: 143 mmol/L (ref 135–145)
Total Bilirubin: 0.2 mg/dL — ABNORMAL LOW (ref 0.3–1.2)
Total Protein: 6.3 g/dL — ABNORMAL LOW (ref 6.5–8.1)

## 2015-07-06 LAB — CBC
HCT: 38.2 % — ABNORMAL LOW (ref 39.0–52.0)
Hemoglobin: 12.2 g/dL — ABNORMAL LOW (ref 13.0–17.0)
MCH: 29.5 pg (ref 26.0–34.0)
MCHC: 31.9 g/dL (ref 30.0–36.0)
MCV: 92.3 fL (ref 78.0–100.0)
PLATELETS: 303 10*3/uL (ref 150–400)
RBC: 4.14 MIL/uL — AB (ref 4.22–5.81)
RDW: 13.5 % (ref 11.5–15.5)
WBC: 7.5 10*3/uL (ref 4.0–10.5)

## 2015-07-06 LAB — T3, FREE: T3 FREE: 2.6 pg/mL (ref 2.0–4.4)

## 2015-07-06 MED ORDER — LEVETIRACETAM 750 MG PO TABS: 1500.0000 mg | ORAL_TABLET | Freq: Two times a day (BID) | ORAL | Status: DC

## 2015-07-06 MED ORDER — PHENYTOIN SODIUM EXTENDED 200 MG PO CAPS: 200.0000 mg | ORAL_CAPSULE | Freq: Two times a day (BID) | ORAL | Status: DC

## 2015-07-06 MED ORDER — LACOSAMIDE 100 MG PO TABS
100.0000 mg | ORAL_TABLET | Freq: Two times a day (BID) | ORAL | Status: DC
Start: 2015-07-06 — End: 2015-11-13

## 2015-07-06 NOTE — Discharge Summary (Signed)
Physician Discharge Summary  [name] MRN: [id number] DOB/AGE: 06-Sep-1960 55 y.o.  PCP: Pcp Not In System   Admit date: 07/03/2015 Discharge date: 07/06/2015  Discharge Diagnoses:   Principal Problem:   Seizure [facility]) Active Problems:   HTN (hypertension)   Seizure ([place])   Depression   Sepsis ([place])   CKD (chronic kidney disease), stage II    Follow-up recommendations Follow-up with PCP in 3-5 days , including all  additional recommended appointments as below Follow-up CBC, CMP in 3-5 days       Current Discharge Medication List    START taking these medications   Details  lacosamide 100 MG TABS Take 1 tablet (100 mg total) by mouth 2 (two) times daily. Qty: 60 tablet, Refills: 3      CONTINUE these medications which have CHANGED   Details  levETIRAcetam (KEPPRA) 750 MG tablet Take 2 tablets (1,500 mg total) by mouth 2 (two) times daily. Qty: 60 tablet, Refills: 3    phenytoin (DILANTIN) 200 MG ER capsule Take 1 capsule (200 mg total) by mouth 2 (two) times daily. Qty: 60 capsule, Refills: 1      CONTINUE these medications which have NOT CHANGED   Details  acetaminophen (TYLENOL) 500 MG tablet Take 500 mg by mouth every 6 (six) hours as needed for moderate pain or headache.    aspirin EC 81 MG tablet Take 81 mg by mouth daily as needed for mild pain.    cetirizine (ZYRTEC) 10 MG tablet Take 10 mg by mouth daily as needed for allergies.    fluticasone (FLONASE) 50 MCG/ACT nasal spray Place 1 spray into both nostrils daily as needed for allergies or rhinitis.      STOP taking these medications     lisinopril (PRINIVIL,ZESTRIL) 10 MG tablet          Discharge Condition: Stable Discharge Instructions Get Medicines reviewed and adjusted: Please take all your medications with you for your next visit with your Primary MD  Please request your Primary MD to go over all hospital tests and procedure/radiological results at the follow up, please ask your Primary  MD to get all Hospital records sent to his/her office.  If you experience worsening of your admission symptoms, develop shortness of breath, life threatening emergency, suicidal or homicidal thoughts you must seek medical attention immediately by calling 911 or calling your MD immediately if symptoms less severe.  You must read complete instructions/literature along with all the possible adverse reactions/side effects for all the Medicines you take and that have been prescribed to you. Take any new Medicines after you have completely understood and accpet all the possible adverse reactions/side effects.   Do not drive when taking Pain medications.   Do not take more than prescribed Pain, Sleep and Anxiety Medications  Special Instructions: If you have smoked or chewed Tobacco in the last 2 yrs please stop smoking, stop any regular Alcohol and or any Recreational drug use.  Wear Seat belts while driving.  Please note  You were cared for by a hospitalist during your hospital stay. Once you are discharged, your primary care physician will handle any further medical issues. Please note that NO REFILLS for any discharge medications will be authorized once you are discharged, as it is imperative that you return to your primary care physician (or establish a relationship with a primary care physician if you do not have one) for your aftercare needs so that they can reassess your need for medications and monitor  your lab values.  Discharge Instructions    Diet - low sodium heart healthy    Complete by:  As directed      Increase activity slowly    Complete by:  As directed             No Known Allergies    Disposition: 01-Home or Self Care   Consults:  Neurology   Significant Diagnostic Studies:  Ct Abdomen Pelvis Wo Contrast  07/03/2015  CLINICAL DATA:  55 year old male with a history of seizure. Altered mental status EXAM: CT ABDOMEN AND PELVIS WITHOUT CONTRAST TECHNIQUE:  Multidetector CT imaging of the abdomen and pelvis was performed following the standard protocol without IV contrast. COMPARISON:  MRI lumbar spine 10/27/2014 FINDINGS: Lower chest: Unremarkable appearance of the soft tissues of the chest wall. Heart size within normal limits.  No pericardial fluid/thickening. Coronary artery disease is present. No lower mediastinal adenopathy. Unremarkable appearance of the distal esophagus. No hiatal hernia. No confluent airspace disease, pleural fluid, or pneumothorax within visualized lung. Abdomen/pelvis: Unremarkable appearance of liver and spleen. Unremarkable appearance of bilateral adrenal glands. No peripancreatic or pericholecystic fluid or inflammatory changes. No radio-opaque gallstones. No intrahepatic or extrahepatic biliary ductal dilatation. No intra-peritoneal free air or significant free-fluid. No abnormally dilated small bowel or colon. No transition point. No inflammatory changes of the mesenteries. Normal appendix identified. No diverticular disease. Right Kidney/Ureter: No hydronephrosis. Trace right-sided perinephric stranding. No nephrolithiasis. Unremarkable course of the right ureter. Left Kidney/Ureter: Tiny 1 mm stone at the inferior collecting system the left kidney. No hydronephrosis. No perinephric stranding. Unremarkable course of the left ureter. Calcifications at the surface of the superior cortex left kidney, uncertain etiology. Unremarkable appearance of the urinary bladder. Unremarkable appearance of prostate. No significant vascular calcification. No aneurysm or periaortic fluid identified. Musculoskeletal: New minimally displaced fractures of the left L2 and L3 transverse process. Unchanged configuration of the T12 vertebral body compared to the prior MR. Mild degenerative disc disease. IMPRESSION: No acute finding of the abdomen. There is questionable right-sided perinephric stranding, and if there is concern for ascending urinary tract  infection, recommend correlation with urinalysis. Tiny nonobstructive left-sided kidney stone within the inferior collecting system. New minimally displaced fractures of the left L2 and L3 transverse process. These results were called by telephone at the time of interpretation on 07/03/2015 at 8:59 am to nurse caring for patient, Randa Ngo, who verbally acknowledged these results. Signed, Dulcy Fanny. Earleen Newport, DO [facility] Radiology Electronically Signed   By: Corrie Mckusick D.O.   On: 07/03/2015 09:06   Ct Head Wo Contrast  07/03/2015  CLINICAL DATA:  Status post seizure. Code sepsis. Initial encounter. EXAM: CT HEAD WITHOUT CONTRAST TECHNIQUE: Contiguous axial images were obtained from the base of the skull through the vertex without intravenous contrast. COMPARISON:  Head CT scan 07/30/2012. FINDINGS: There is some hypoattenuation in the subcortical and periventricular deep white matter and a punctate remote lacunar infarction in the right thalamus. No evidence of acute intracranial abnormality including hemorrhage, infarct, mass lesion, mass effect, midline shift or abnormal extra-axial fluid collection is identified. Scattered ethmoid air cell disease is seen. Very mild mucosal thickening is noted in the maxillary sinuses. There is also some mucosal thickening in the frontal sinuses. The calvarium is intact. IMPRESSION: No acute intracranial abnormality. Mild appearing chronic microvascular ischemic change, stable appearing. Mild sinus disease. Electronically Signed   By: Inge Rise M.D.   On: 07/03/2015 08:43   Dg  Chest Port 1 View  07/03/2015  CLINICAL DATA:  Code sepsis.  Post seizure. EXAM: PORTABLE CHEST 1 VIEW COMPARISON:  07/08/2003 FINDINGS: Examination is technically limited. The lung apices and left costophrenic angle are excluded from the field of view. Patient is rotated. Patient was confused and combative and this was best positioning  available. Heart size and pulmonary vascularity are normal. Visualize lungs appear clear. No visible pneumothorax or consolidation. IMPRESSION: No evidence of active pulmonary disease on limited imaging. Electronically Signed   By: Lucienne Capers M.D.   On: 07/03/2015 05:12        Filed Weights   07/04/15 1200  Weight: 85.2 kg (187 lb 13.3 oz)     Microbiology: Recent Results (from the past 240 hour(s))  Blood Culture (routine x 2)     Status: None (Preliminary result)   Collection Time: 07/03/15  3:51 AM  Result Value Ref Range Status   Specimen Description BLOOD RIGHT ARM  Final   Special Requests BOTTLES DRAWN AEROBIC AND ANAEROBIC 5ML  Final   Culture NO GROWTH 2 DAYS  Final   Report Status PENDING  Incomplete  Blood Culture (routine x 2)     Status: None (Preliminary result)   Collection Time: 07/03/15  4:39 AM  Result Value Ref Range Status   Specimen Description BLOOD LEFT ARM  Final   Special Requests IN PEDIATRIC BOTTLE 2ML  Final   Culture NO GROWTH 2 DAYS  Final   Report Status PENDING  Incomplete  Urine culture     Status: None   Collection Time: 07/03/15  6:47 AM  Result Value Ref Range Status   Specimen Description URINE, RANDOM  Final   Special Requests NONE  Final   Culture NO GROWTH 1 DAY  Final   Report Status 07/04/2015 FINAL  Final  MRSA PCR Screening     Status: None   Collection Time: 07/03/15 12:27 PM  Result Value Ref Range Status   MRSA by PCR NEGATIVE NEGATIVE Final    Comment:        The GeneXpert MRSA Assay (FDA approved for NASAL specimens only), is one component of a comprehensive MRSA colonization surveillance program. It is not intended to diagnose MRSA infection nor to guide or monitor treatment for MRSA infections.        Blood Culture    Component Value Date/Time   SDES URINE, RANDOM 07/03/2015 0647   SPECREQUEST NONE 07/03/2015 0647   CULT NO GROWTH 1 DAY 07/03/2015 0647   REPTSTATUS 07/04/2015 FINAL 07/03/2015 0647       Labs: Results for orders placed or performed during the hospital encounter of 07/03/15 (from the past 48 hour(s))  Basic metabolic panel     Status: Abnormal   Collection Time: 07/05/15  6:32 AM  Result Value Ref Range   Sodium 143 135 - 145 mmol/L   Potassium 3.8 3.5 - 5.1 mmol/L   Chloride 107 101 - 111 mmol/L   CO2 25 22 - 32 mmol/L   Glucose, Bld 99 65 - 99 mg/dL   BUN 12 6 - 20 mg/dL   Creatinine, Ser 0.96 0.61 - 1.24 mg/dL   Calcium 8.7 (L) 8.9 - 10.3 mg/dL   GFR calc non Af Amer >60 >60 mL/min   GFR calc Af Amer >60 >60 mL/min    Comment: (NOTE) The eGFR has been calculated using the CKD EPI equation. This calculation has not been validated in all clinical situations. eGFR's persistently <60 mL/min signify possible  Chronic Kidney Disease.    Anion gap 11 5 - 15  TSH     Status: None   Collection Time: 07/05/15 10:54 AM  Result Value Ref Range   TSH 1.326 0.350 - 4.500 uIU/mL  T4, free     Status: None   Collection Time: 07/05/15 11:09 AM  Result Value Ref Range   Free T4 0.98 0.61 - 1.12 ng/dL  T3, free     Status: None   Collection Time: 07/05/15 11:09 AM  Result Value Ref Range   T3, Free 2.6 2.0 - 4.4 pg/mL    Comment: (NOTE) Performed At: [facility] [place], [place] [id number] Lindon Romp MD [id number]   CK     Status: Abnormal   Collection Time: 07/05/15 11:09 AM  Result Value Ref Range   Total CK 498 (H) 49 - 397 U/L  CBC     Status: Abnormal   Collection Time: 07/06/15  4:09 AM  Result Value Ref Range   WBC 7.5 4.0 - 10.5 K/uL   RBC 4.14 (L) 4.22 - 5.81 MIL/uL   Hemoglobin 12.2 (L) 13.0 - 17.0 g/dL   HCT 38.2 (L) 39.0 - 52.0 %   MCV 92.3 78.0 - 100.0 fL   MCH 29.5 26.0 - 34.0 pg   MCHC 31.9 30.0 - 36.0 g/dL   RDW 13.5 11.5 - 15.5 %   Platelets 303 150 - 400 K/uL  Comprehensive metabolic panel     Status: Abnormal   Collection Time: 07/06/15  4:09 AM  Result Value Ref Range   Sodium 143 135 - 145 mmol/L    Potassium 3.5 3.5 - 5.1 mmol/L   Chloride 106 101 - 111 mmol/L   CO2 23 22 - 32 mmol/L   Glucose, Bld 114 (H) 65 - 99 mg/dL   BUN 15 6 - 20 mg/dL   Creatinine, Ser 1.18 0.61 - 1.24 mg/dL   Calcium 8.4 (L) 8.9 - 10.3 mg/dL   Total Protein 6.3 (L) 6.5 - 8.1 g/dL   Albumin 2.8 (L) 3.5 - 5.0 g/dL   AST 25 15 - 41 U/L   ALT 19 17 - 63 U/L   Alkaline Phosphatase 65 38 - 126 U/L   Total Bilirubin 0.2 (L) 0.3 - 1.2 mg/dL   GFR calc non Af Amer >60 >60 mL/min   GFR calc Af Amer >60 >60 mL/min    Comment: (NOTE) The eGFR has been calculated using the CKD EPI equation. This calculation has not been validated in all clinical situations. eGFR's persistently <60 mL/min signify possible Chronic Kidney Disease.    Anion gap 14 5 - 15     Lipid Panel  No results found for: CHOL, TRIG, HDL, CHOLHDL, VLDL, LDLCALC, LDLDIRECT   No results found for: HGBA1C   Lab Results  Component Value Date   CREATININE 1.18 07/06/2015     55 y.o. 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 Per pt's caregiver he had five seizures over 2 days, the last of which lasted for about 30 min and resolved only after given 79m IV midazolam by EMS.   In the ED he was found to have subtherapeutic phenytoin level <2.5, WBC 17.7, lactate is 3.93, temperature 102.4, tachycardia, tachypnea, and a negative chest x-ray for acute abnormalities.   Hospital course Uncontrolled Seizure d/o - multiple breakthrough Szs meds have been adjusted per Neuro suggestions - of  note, pt was clearly not taking his meds at home as his admit dilatin level was essentially 0 . Repeat Dilantin levels As per neurology 1) Vimpat 226m IV x 1 followed by 1079mBID 2) dilantin 20068mID-repeat Dilantin levels 3) keppra 1500m41mD Case management has provided patient with two-week trial sticker No PT needs  Suppressed TSH in 2014 Repeat thyroid  function within normal limits TSH 1.3  SIRS No evidence of acute infection - SIRS physiology has resolved - due to Sz w/ DH Antibiotics discontinued  Mild hypokalemia Replace and recheck in AM   Abdominal pain Appears to have resolved - imaging unremarkable   HTN BP currently well controlled   Depression  CKD stage II Baseline creatinine 1.0-1.36 - appears stable at this time     Discharge Exam:    Blood pressure 131/79, pulse 88, temperature 98.1 F (36.7 C), temperature source Oral, resp. rate 20, height '5\' 4"'  (1.626 m), weight 85.2 kg (187 lb 13.3 oz), SpO2 98 %. General: No acute respiratory distress Lungs: Clear to auscultation bilaterally without wheezes or crackles Cardiovascular: Regular rate and rhythm without murmur gallop or rub normal S1 and S2 Abdomen: Nontender, nondistended, soft, bowel sounds positive, no rebound, no ascites, no appreciable mass Extremities: No significant cyanosis, clubbing, or edema bilateral lower extremities     Follow-up Information    Follow up with PCP. Schedule an appointment as soon as possible for a visit in 3 days.   Why:  Hospital follow-up      Signed: ABROReyne Dumas/2017, 10:36 AM        Time spent >45 mins

## 2015-07-08 LAB — CULTURE, BLOOD (ROUTINE X 2)
CULTURE: NO GROWTH
Culture: NO GROWTH

## 2015-07-08 LAB — PHENYTOIN LEVEL, FREE AND TOTAL
PHENYTOIN FREE: NOT DETECTED ug/mL (ref 1.0–2.0)
PHENYTOIN, TOTAL: 5.7 ug/mL — AB (ref 10.0–20.0)

## 2015-11-13 ENCOUNTER — Other Ambulatory Visit: Payer: Self-pay | Admitting: *Deleted

## 2015-11-13 MED ORDER — LACOSAMIDE 100 MG PO TABS: 100.0000 mg | ORAL_TABLET | Freq: Two times a day (BID) | ORAL | 3 refills | Status: DC

## 2015-11-13 NOTE — Telephone Encounter (Signed)
Fax confirmation received. [phone].

## 2015-11-13 NOTE — Telephone Encounter (Signed)
According to er record it was reordered at last er visit by Dr. Allyson Sabal, er md. The 3 refills would be up now so pt must be taking the medication

## 2015-11-13 NOTE — Telephone Encounter (Signed)
Received fax for vimpat 100mg  po bid request from [facility].  From last note pt not able to afford.  Taking dilantin and keppra.  Seen in ED twice since last seen for sz.  Last visit 07/2015 started vimpat 100mg  po bid.  Needing refill.  Please advise.   I attempted to call and speak to brother, [name], caregiver, no answer. Has yrly appt in 01/2016, trying to get to come in sooner.

## 2015-12-18 DIAGNOSIS — J302 Other seasonal allergic rhinitis: Secondary | ICD-10-CM | POA: Diagnosis not present

## 2015-12-18 DIAGNOSIS — G40909 Epilepsy, unspecified, not intractable, without status epilepticus: Secondary | ICD-10-CM | POA: Diagnosis not present

## 2015-12-18 DIAGNOSIS — Z131 Encounter for screening for diabetes mellitus: Secondary | ICD-10-CM | POA: Diagnosis not present

## 2015-12-18 DIAGNOSIS — Z125 Encounter for screening for malignant neoplasm of prostate: Secondary | ICD-10-CM | POA: Diagnosis not present

## 2015-12-18 DIAGNOSIS — Z13220 Encounter for screening for lipoid disorders: Secondary | ICD-10-CM | POA: Diagnosis not present

## 2015-12-18 DIAGNOSIS — I10 Essential (primary) hypertension: Secondary | ICD-10-CM | POA: Diagnosis not present

## 2015-12-18 DIAGNOSIS — E559 Vitamin D deficiency, unspecified: Secondary | ICD-10-CM | POA: Diagnosis not present

## 2016-01-03 ENCOUNTER — Telehealth: Payer: Self-pay | Admitting: Nurse Practitioner

## 2016-01-03 NOTE — Telephone Encounter (Signed)
Called to remind of 11/6 appt.  A gentleman named Broadus John answered and advised that the previous emergency contact, Cornelia Copa, for the patient, Infant, passed away 2022-06-04.  He stated that he is trying to help sort things out since his brother's passing.  The patient, [name], does not speak [place] and the new emergency contact stated he is having to cx the 11/6 appt at this time until he can sort out some things since his brother's passing.  Broadus John did advise that the patient has had increased seizures and he feels it has been due to the passing of Cornelia Copa.  Cornelia Copa was the care taker basically for Mr. Nitish according to [place].  Broadus John advised that he WILL call us back soon to get the patient, Mr. Kennan, back in.

## 2016-01-06 ENCOUNTER — Ambulatory Visit: Payer: Medicare Other | Admitting: Nurse Practitioner

## 2016-03-23 ENCOUNTER — Telehealth: Payer: Self-pay | Admitting: Nurse Practitioner

## 2016-03-23 NOTE — Telephone Encounter (Signed)
Patient has been worked into an Engineer, maintenance (IT) w/ [employer]oyle Sauer on 03/24/16.

## 2016-03-23 NOTE — Telephone Encounter (Signed)
Patient's friend Sharrell Ku is calling stating the patient needs an appointment to address the patient's seizures. I offered a March appointment but stated needed sooner.  Please call the patient at [phone].

## 2016-03-24 ENCOUNTER — Ambulatory Visit (INDEPENDENT_AMBULATORY_CARE_PROVIDER_SITE_OTHER): Payer: Medicare Other | Admitting: Nurse Practitioner

## 2016-03-24 ENCOUNTER — Encounter: Payer: Self-pay | Admitting: Nurse Practitioner

## 2016-03-24 VITALS — BP 122/69 | HR 89 | Ht 64.0 in | Wt 191.0 lb

## 2016-03-24 DIAGNOSIS — G40219 Localization-related (focal) (partial) symptomatic epilepsy and epileptic syndromes with complex partial seizures, intractable, without status epilepticus: Secondary | ICD-10-CM

## 2016-03-24 DIAGNOSIS — G40319 Generalized idiopathic epilepsy and epileptic syndromes, intractable, without status epilepticus: Secondary | ICD-10-CM | POA: Diagnosis not present

## 2016-03-24 DIAGNOSIS — Z5181 Encounter for therapeutic drug level monitoring: Secondary | ICD-10-CM | POA: Diagnosis not present

## 2016-03-24 NOTE — Progress Notes (Signed)
[facility]  PATIENT: [name] DOB: February 21, 1961   REASON FOR VISIT: follow-up for generalized epilepsy HISTORY FROM: Interpreter agent does not speak English    HISTORY OF PRESENT ILLNESS:[name], 55 year old male returns for followup. He does not speak [place]. He is accompanied by his 24/ 7 caregiver his brother and an interpreter. Neither brother or patient speak [place].. He has a seizure disorder and is currently on Dilantin 400 mg daily, Keppra 1500 mg twice daily. His last seizure activity was last week, he had 2 seizure-like events with loss of consciousness with each episode lasting a few minutes. The patient denies missing any doses of medication . The patient has had seizures in the past with noncompliance. Appetite is reportedly good, sleeps well at night. No falls, no balance issues.He returns for reevaluation .   HISTORY: He does not speak [place] and understands very little spoken [place]. He has a caregiver with him today who intreprets. By history he has mild cerebral palsy and a possible head injury in childhood. He has hx of gout and HTN. He has a 4th grade education and lives with his caregiver.  His brother says his seizures have been in good control. He continues to have problems with short term memory and learning new information. He has no problems with day to day functioning. He is independent with ADL's. He had an admission to inpatient Behavorial health in January and June for uncontrolled anger.   He has been tapered off phenobarbital, he is not taking Dilantin 100 mg 2 tablets twice a day, levetiracetam 1000mg  1 and a half tablets twice a day. He continues to have seizures, the longest    REVIEW OF SYSTEMS: Full 14 system review of systems performed and notable only for those listed, all others are neg:  Constitutional: Fatigue  Cardiovascular: neg Ear/Nose/Throat: neg  Skin: neg Eyes: neg Respiratory: neg Gastroitestinal: neg    Hematology/Lymphatic: neg  Endocrine: neg Musculoskeletal:neg Allergy/Immunology: neg Neurological: seizure disorder,  Psychiatric: neg Sleep : neg   ALLERGIES: No Known Allergies  HOME MEDICATIONS: Outpatient Medications Prior to Visit  Medication Sig Dispense Refill  . levETIRAcetam (KEPPRA) 750 MG tablet Take 2 tablets (1,500 mg total) by mouth 2 (two) times daily. 60 tablet 3  . phenytoin (DILANTIN) 200 MG ER capsule Take 1 capsule (200 mg total) by mouth 2 (two) times daily. 60 capsule 1  . acetaminophen (TYLENOL) 500 MG tablet Take 500 mg by mouth every 6 (six) hours as needed for moderate pain or headache.    [name] aspirin EC 81 MG tablet Take 81 mg by mouth daily as needed for mild pain.    . cetirizine (ZYRTEC) 10 MG tablet Take 10 mg by mouth daily as needed for allergies.    . fluticasone (FLONASE) 50 MCG/ACT nasal spray Place 1 spray into both nostrils daily as needed for allergies or rhinitis.    . Lacosamide 100 MG TABS Take 1 tablet (100 mg total) by mouth 2 (two) times daily. 60 tablet 3   No facility-administered medications prior to visit.     PAST MEDICAL HISTORY: Past Medical History:  Diagnosis Date  . Anxiety   . Back pain    low back pain/ compressed disk in lower back.   . CKD (chronic kidney disease), stage II   . Depression   . Hypertension   . Language barrier 03-01-13   Speaks very little English,primary-Vietnamese "[name] Dialect"  . Seizures ([place])    due head trauma-seizures are  less, but occurs from mild to more severe, which causes agitation to mental state.  [name] TBI (traumatic brain injury) ([place])    CARE FOR BY BROTHER    PAST SURGICAL HISTORY: Past Surgical History:  Procedure Laterality Date  . ABDOMINAL SURGERY    . COLONOSCOPY N/A 03/17/2013   Procedure: COLONOSCOPY;  Surgeon: [name], MD;  Location: [facility];  Service: Endoscopy;  Laterality: N/A;  . NO PAST SURGERIES     ? one surgery in [place]-? what, has abdominal scar.     FAMILY HISTORY: Family History  Problem Relation Age of Onset  . Seizures Mother     per his nonbiological brother    SOCIAL HISTORY: Social History   Social History  . Marital status: Single    Spouse name: N/A  . Number of children: N/A  . Years of education: 4   Occupational History  .  Unemployed   Social History Main Topics  . Smoking status: Former Smoker    Quit date: 03/02/1989  . Smokeless tobacco: Never Used  . Alcohol use No  . Drug use: No  . Sexual activity: Not Currently   Other Topics Concern  . Not on file   Social History Narrative   ** Merged History Encounter **    Patient is single.   Patient is disabled.   Patient has a 4th grade education.   Patient drinks two cups of coffee daily.     PHYSICAL EXAM  Vitals:   03/24/16 0828  Weight: 191 lb (86.6 kg)  Height: 5\' 4"  (1.626 m)   Body mass index is 32.79 kg/m. Generalized: Well developed, in no acute distress  Head: normocephalic and atraumatic,. Oropharynx benign  Musculoskeletal: No deformity   Neurological examination  Mentation: Alert oriented to time, place, history taking. Follows all commands , does not speak or understand [place].   Cranial nerve II-XII: Pupils were equal round reactive to light extraocular movements were full, visual field were full on confrontational test. Left exotropia. Facial sensation and strength were normal. hearing was intact to finger rubbing bilaterally. Uvula tongue midline. head turning and shoulder shrug were normal and symmetric.Tongue protrusion into cheek strength was normal. Motor: normal bulk and tone, full strength in the BUE, BLE,  Sensory: normal and symmetric to light touch, pinprick, and vibration , withdraws to pain Coordination: finger-nose-finger, heel-to-shin bilaterally, no dysmetria Reflexes: 1+ upper lower and symmetric  Gait and Station: Rising up from seated position without assistance, normal stance, moderate stride,  good arm swing, smooth turning, no assistive device. Tandem gait is steady  DIAGNOSTIC DATA (LABS, IMAGING, TESTING) - I reviewed patient records, labs, notes, testing and imaging myself where available.  Lab Results  Component Value Date   WBC 7.5 07/06/2015   HGB 12.2 (L) 07/06/2015   HCT 38.2 (L) 07/06/2015   MCV 92.3 07/06/2015   PLT 303 07/06/2015      Component Value Date/Time   NA 143 07/06/2015 0409   NA 140 01/07/2015 1505   K 3.5 07/06/2015 0409   CL 106 07/06/2015 0409   CO2 23 07/06/2015 0409   GLUCOSE 114 (H) 07/06/2015 0409   BUN 15 07/06/2015 0409   BUN 17 01/07/2015 1505   CREATININE 1.18 07/06/2015 0409   CALCIUM 8.4 (L) 07/06/2015 0409   PROT 6.3 (L) 07/06/2015 0409   PROT 7.5 01/07/2015 1505   ALBUMIN 2.8 (L) 07/06/2015 0409   ALBUMIN 4.2 01/07/2015 1505   AST 25 07/06/2015 0409  ALT 19 07/06/2015 0409   ALKPHOS 65 07/06/2015 0409   BILITOT 0.2 (L) 07/06/2015 0409   BILITOT <0.2 01/07/2015 1505   GFRNONAA >60 07/06/2015 0409   GFRAA >60 07/06/2015 0409       ASSESSMENT AND PLAN  56 y.o. year old male  has a past medical history of Anxiety; Back pain; CKD (chronic kidney disease), stage II; Depression; Hypertension; Language barrier (03-01-13); Seizures ([place]); and TBI (traumatic brain injury) ([place]). here to follow up For his seizure disorder. He had 2 seizures last week.The patient is a current patient of Dr. Krista Blue  who is out of the office today . This note is sent to the work in doctor.      Will check labs, CBC, CMP and Dilantin level and Keppra level in am patient took am doses 1 hour ago RX for both Dilantin and Keppra  To be renewed when labs back F/U in 6 months Call for seizure activity Greater than 50% of time during this 15 minute visit was spent on counseling,explanation of diagnosis, planning of further management, discussion with patient and family through the interpreter of why  the labs cannot be done today , and coordination of care.  His medications will refill. Refill once I get his labs back. I may need to adjust his medications. Dennie Bible, [facility], [facility], APRN  [facility] [address], [place], [place] [address] [phone]

## 2016-03-24 NOTE — Patient Instructions (Signed)
Will check labs, CBC, CMP and Dilantin level and Keppra level in am patient took am doses 1 hour ago RX for both Dilantin and Keppra  To be renewed when labs back F/U in 6 months Call for seizure activity

## 2016-03-25 ENCOUNTER — Other Ambulatory Visit: Payer: Self-pay | Admitting: *Deleted

## 2016-03-25 ENCOUNTER — Other Ambulatory Visit (INDEPENDENT_AMBULATORY_CARE_PROVIDER_SITE_OTHER): Payer: Self-pay

## 2016-03-25 DIAGNOSIS — R569 Unspecified convulsions: Secondary | ICD-10-CM | POA: Diagnosis not present

## 2016-03-25 DIAGNOSIS — Z0289 Encounter for other administrative examinations: Secondary | ICD-10-CM
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
## 2016-03-25 NOTE — Progress Notes (Signed)
Pt here with care giver.  He is expressing concern about pt having some confusion (packing things away for moving out) when asked about this pt does not know why.  Some incontinence several times.  Language barrier.  Gave me numbers of [facility] and [facility], and [phone] [place].  There was some question about having blood done.  Looks from system completed, Malachy Mood with [facility] does not recall drawing this blood.

## 2016-03-26 ENCOUNTER — Other Ambulatory Visit: Payer: Self-pay | Admitting: Nurse Practitioner

## 2016-03-26 ENCOUNTER — Encounter: Payer: Self-pay | Admitting: *Deleted

## 2016-03-26 ENCOUNTER — Telehealth: Payer: Self-pay | Admitting: *Deleted

## 2016-03-26 DIAGNOSIS — F23 Brief psychotic disorder: Secondary | ICD-10-CM | POA: Diagnosis not present

## 2016-03-26 DIAGNOSIS — F29 Unspecified psychosis not due to a substance or known physiological condition: Secondary | ICD-10-CM | POA: Diagnosis not present

## 2016-03-26 MED ORDER — PHENYTOIN SODIUM EXTENDED 200 MG PO CAPS: 200.0000 mg | ORAL_CAPSULE | Freq: Two times a day (BID) | ORAL | 6 refills | Status: DC

## 2016-03-26 NOTE — Progress Notes (Signed)
Faxed printed rx phenytoin to pt pharmacy. [phone]. Received confirmation.

## 2016-03-26 NOTE — Telephone Encounter (Signed)
-----   Message from Dennie Bible, NP sent at 03/26/2016  3:18 PM EST ----- Labs ok Dilantin  10.7 . Therapeutic keppra level not back. Please call the intrepretor to translate. Thanks

## 2016-03-26 NOTE — Telephone Encounter (Signed)
Called [employer] and spoke with vietnamese interpreter. [phone]. She called number listed for brother, but patient picked up and was very confused. Interpreter asked if we could speak with brother, but could not be re-directed. Kept asking for my name and number. We gave him the office number.  Tried to go over results, but unsuccessful.  Interpreter advised pt to have a good night and disconnected call. Advised we will try and get in touch with brother again.

## 2016-03-26 NOTE — Progress Notes (Signed)
I spoke to [employer], Scientist, research (physical sciences).  She relayed that pt is being seen at [facility] for evaluation today (as we speak with pastor).  Caregiver is now brother (who is actually cousin).  I told her that if [facility] can fax a release form for lab results I could fax to them. There was a mix up with [facility] and the 03-25-16 is accurate.  She verbalized understanding.

## 2016-03-26 NOTE — Progress Notes (Signed)
[name] returned RN's call  She can be reached at [phone]

## 2016-03-27 ENCOUNTER — Encounter (HOSPITAL_COMMUNITY): Payer: Self-pay | Admitting: Emergency Medicine

## 2016-03-27 ENCOUNTER — Emergency Department (HOSPITAL_COMMUNITY): Payer: Medicare Other

## 2016-03-27 ENCOUNTER — Observation Stay (HOSPITAL_COMMUNITY)
Admission: EM | Admit: 2016-03-27 | Discharge: 2016-03-28 | Disposition: A | Discharge status: Home / Self Care | Payer: Medicare Other | Attending: Internal Medicine | Admitting: Internal Medicine

## 2016-03-27 DIAGNOSIS — N289 Disorder of kidney and ureter, unspecified: Secondary | ICD-10-CM | POA: Diagnosis not present

## 2016-03-27 DIAGNOSIS — G40909 Epilepsy, unspecified, not intractable, without status epilepticus: Secondary | ICD-10-CM

## 2016-03-27 DIAGNOSIS — F32A Depression, unspecified: Secondary | ICD-10-CM | POA: Diagnosis present

## 2016-03-27 DIAGNOSIS — I129 Hypertensive chronic kidney disease with stage 1 through stage 4 chronic kidney disease, or unspecified chronic kidney disease: Secondary | ICD-10-CM | POA: Insufficient documentation

## 2016-03-27 DIAGNOSIS — N182 Chronic kidney disease, stage 2 (mild): Secondary | ICD-10-CM | POA: Diagnosis not present

## 2016-03-27 DIAGNOSIS — Z87820 Personal history of traumatic brain injury: Secondary | ICD-10-CM | POA: Diagnosis not present

## 2016-03-27 DIAGNOSIS — Z87891 Personal history of nicotine dependence: Secondary | ICD-10-CM | POA: Diagnosis not present

## 2016-03-27 DIAGNOSIS — G40409 Other generalized epilepsy and epileptic syndromes, not intractable, without status epilepticus: Secondary | ICD-10-CM | POA: Diagnosis not present

## 2016-03-27 DIAGNOSIS — Z79899 Other long term (current) drug therapy: Secondary | ICD-10-CM | POA: Insufficient documentation

## 2016-03-27 DIAGNOSIS — F329 Major depressive disorder, single episode, unspecified: Secondary | ICD-10-CM | POA: Insufficient documentation

## 2016-03-27 DIAGNOSIS — R4182 Altered mental status, unspecified: Secondary | ICD-10-CM | POA: Diagnosis not present

## 2016-03-27 DIAGNOSIS — F419 Anxiety disorder, unspecified: Secondary | ICD-10-CM | POA: Insufficient documentation

## 2016-03-27 DIAGNOSIS — Z9119 Patient's noncompliance with other medical treatment and regimen: Secondary | ICD-10-CM | POA: Diagnosis not present

## 2016-03-27 DIAGNOSIS — R569 Unspecified convulsions: Secondary | ICD-10-CM

## 2016-03-27 DIAGNOSIS — I10 Essential (primary) hypertension: Secondary | ICD-10-CM | POA: Diagnosis present

## 2016-03-27 DIAGNOSIS — G40319 Generalized idiopathic epilepsy and epileptic syndromes, intractable, without status epilepticus: Secondary | ICD-10-CM | POA: Diagnosis present

## 2016-03-27 LAB — COMPREHENSIVE METABOLIC PANEL
ALBUMIN: 3.8 g/dL (ref 3.5–5.0)
ALK PHOS: 78 IU/L (ref 39–117)
ALK PHOS: 66 U/L (ref 38–126)
ALT: 17 IU/L (ref 0–44)
ALT: 19 U/L (ref 17–63)
ANION GAP: 9 (ref 5–15)
AST: 26 IU/L (ref 0–40)
AST: 25 U/L (ref 15–41)
Albumin/Globulin Ratio: 1 — ABNORMAL LOW (ref 1.2–2.2)
Albumin: 4 g/dL (ref 3.5–5.5)
BILIRUBIN TOTAL: 0.2 mg/dL (ref 0.0–1.2)
BILIRUBIN TOTAL: 0.5 mg/dL (ref 0.3–1.2)
BUN / CREAT RATIO: 11 (ref 9–20)
BUN: 14 mg/dL (ref 6–24)
BUN: 11 mg/dL (ref 6–20)
CALCIUM: 9.1 mg/dL (ref 8.9–10.3)
CO2: 24 mmol/L (ref 18–29)
CO2: 25 mmol/L (ref 22–32)
Calcium: 9.2 mg/dL (ref 8.7–10.2)
Chloride: 100 mmol/L (ref 96–106)
Chloride: 103 mmol/L (ref 101–111)
Creatinine, Ser: 1.22 mg/dL (ref 0.76–1.27)
Creatinine, Ser: 1.38 mg/dL — ABNORMAL HIGH (ref 0.61–1.24)
GFR calc Af Amer: 76 mL/min/{1.73_m2} (ref >59)
GFR calc non Af Amer: 65 mL/min/{1.73_m2} (ref >59)
GFR, EST NON AFRICAN AMERICAN: 55 mL/min — AB (ref >60)
GLUCOSE: 122 mg/dL — AB (ref 65–99)
Globulin, Total: 3.9 g/dL (ref 1.5–4.5)
Glucose, Bld: 99 mg/dL (ref 65–99)
POTASSIUM: 4.1 mmol/L (ref 3.5–5.2)
POTASSIUM: 3.6 mmol/L (ref 3.5–5.1)
Sodium: 138 mmol/L (ref 134–144)
Sodium: 137 mmol/L (ref 135–145)
TOTAL PROTEIN: 7.5 g/dL (ref 6.5–8.1)
Total Protein: 7.9 g/dL (ref 6.0–8.5)

## 2016-03-27 LAB — CBC WITH DIFFERENTIAL/PLATELET
BASOS ABS: 0 10*3/uL (ref 0.0–0.1)
BASOS PCT: 0 %
BASOS: 0 %
Basophils Absolute: 0 10*3/uL (ref 0.0–0.2)
EOS (ABSOLUTE): 0.3 10*3/uL (ref 0.0–0.4)
Eos: 4 %
Eosinophils Absolute: 0.4 10*3/uL (ref 0.0–0.7)
Eosinophils Relative: 4 %
HEMATOCRIT: 39.8 % (ref 39.0–52.0)
Hematocrit: 39.4 % (ref 37.5–51.0)
Hemoglobin: 13.6 g/dL (ref 13.0–17.7)
Hemoglobin: 13.4 g/dL (ref 13.0–17.0)
Immature Grans (Abs): 0 10*3/uL (ref 0.0–0.1)
Immature Granulocytes: 0 %
LYMPHS ABS: 1.5 10*3/uL (ref 0.7–3.1)
LYMPHS PCT: 25 %
Lymphs Abs: 2.3 10*3/uL (ref 0.7–4.0)
Lymphs: 21 %
MCH: 31.1 pg (ref 26.6–33.0)
MCH: 30.7 pg (ref 26.0–34.0)
MCHC: 34.5 g/dL (ref 31.5–35.7)
MCHC: 33.7 g/dL (ref 30.0–36.0)
MCV: 90 fL (ref 79–97)
MCV: 91.3 fL (ref 78.0–100.0)
MONOS ABS: 0.7 10*3/uL (ref 0.1–0.9)
Monocytes Absolute: 0.9 10*3/uL (ref 0.1–1.0)
Monocytes Relative: 10 %
Monocytes: 9 %
NEUTROS ABS: 5.5 10*3/uL (ref 1.7–7.7)
NEUTROS PCT: 66 %
NEUTROS PCT: 61 %
Neutrophils Absolute: 4.8 10*3/uL (ref 1.4–7.0)
PLATELETS: 359 10*3/uL (ref 150–379)
Platelets: 357 10*3/uL (ref 150–400)
RBC: 4.38 x10E6/uL (ref 4.14–5.80)
RBC: 4.36 MIL/uL (ref 4.22–5.81)
RDW: 14.8 % (ref 12.3–15.4)
RDW: 13.2 % (ref 11.5–15.5)
WBC: 7.3 10*3/uL (ref 3.4–10.8)
WBC: 9.2 10*3/uL (ref 4.0–10.5)

## 2016-03-27 LAB — URINALYSIS, ROUTINE W REFLEX MICROSCOPIC
BACTERIA UA: NONE SEEN
BILIRUBIN URINE: NEGATIVE
GLUCOSE, UA: NEGATIVE mg/dL
Ketones, ur: NEGATIVE mg/dL
LEUKOCYTES UA: NEGATIVE
NITRITE: NEGATIVE
PROTEIN: NEGATIVE mg/dL
SPECIFIC GRAVITY, URINE: 1.015 (ref 1.005–1.030)
Squamous Epithelial / LPF: NONE SEEN
pH: 5 (ref 5.0–8.0)

## 2016-03-27 LAB — PHENYTOIN LEVEL, TOTAL
PHENYTOIN (DILANTIN), SERUM: 10.7 ug/mL (ref 10.0–20.0)
Phenytoin Lvl: 10.8 ug/mL (ref 10.0–20.0)

## 2016-03-27 LAB — RPR: RPR Ser Ql: NONREACTIVE

## 2016-03-27 LAB — CK: Total CK: 302 U/L (ref 49–397)

## 2016-03-27 LAB — AMMONIA: AMMONIA: 48 umol/L — AB (ref 9–35)

## 2016-03-27 LAB — MAGNESIUM: MAGNESIUM: 1.8 mg/dL (ref 1.7–2.4)

## 2016-03-27 LAB — TROPONIN I

## 2016-03-27 LAB — HIV ANTIBODY (ROUTINE TESTING W REFLEX): HIV SCREEN 4TH GENERATION: NONREACTIVE

## 2016-03-27 LAB — RAPID URINE DRUG SCREEN, HOSP PERFORMED
AMPHETAMINES: NOT DETECTED
Barbiturates: NOT DETECTED
Benzodiazepines: NOT DETECTED
Cocaine: NOT DETECTED
OPIATES: NOT DETECTED
TETRAHYDROCANNABINOL: NOT DETECTED

## 2016-03-27 LAB — VITAMIN B12: Vitamin B-12: 186 pg/mL (ref 180–914)

## 2016-03-27 LAB — LEVETIRACETAM LEVEL: Levetiracetam Lvl: 22.9 ug/mL (ref 10.0–40.0)

## 2016-03-27 LAB — TSH: TSH: 0.786 u[IU]/mL (ref 0.350–4.500)

## 2016-03-27 LAB — ETHANOL

## 2016-03-27 MED ORDER — SODIUM CHLORIDE 0.9 % IV SOLN
75.0000 mL/h | INTRAVENOUS | Status: DC
  Administered 2016-03-27 (×2): 75 mL/h via INTRAVENOUS

## 2016-03-27 MED ORDER — ACETAMINOPHEN 325 MG PO TABS: 650.0000 mg | ORAL_TABLET | ORAL | Status: DC | PRN

## 2016-03-27 MED ORDER — LEVETIRACETAM 750 MG PO TABS
1500.0000 mg | ORAL_TABLET | Freq: Two times a day (BID) | ORAL | Status: DC
  Administered 2016-03-27 – 2016-03-28 (×3): 1500 mg via ORAL
  Filled 2016-03-27: qty 2
  Filled 2016-03-27: qty 3
  Filled 2016-03-27: qty 2

## 2016-03-27 MED ORDER — PHENYTOIN SODIUM EXTENDED 100 MG PO CAPS
200.0000 mg | ORAL_CAPSULE | Freq: Two times a day (BID) | ORAL | Status: DC
Start: 2016-03-27 — End: 2016-03-28
  Administered 2016-03-27 – 2016-03-28 (×3): 200 mg via ORAL
  Filled 2016-03-27 (×3): qty 2

## 2016-03-27 MED ORDER — SENNOSIDES-DOCUSATE SODIUM 8.6-50 MG PO TABS
1.0000 | ORAL_TABLET | Freq: Every evening | ORAL | Status: DC | PRN
  Filled 2016-03-27: qty 1

## 2016-03-27 MED ORDER — ACETAMINOPHEN 650 MG RE SUPP: 650.0000 mg | RECTAL | Status: DC | PRN

## 2016-03-27 MED ORDER — LISINOPRIL 10 MG PO TABS
10.0000 mg | ORAL_TABLET | Freq: Every day | ORAL | Status: DC
  Administered 2016-03-27 – 2016-03-28 (×2): 10 mg via ORAL
  Filled 2016-03-27 (×2): qty 1

## 2016-03-27 MED ORDER — HEPARIN SODIUM (PORCINE) 5000 UNIT/ML IJ SOLN
5000.0000 [IU] | Freq: Three times a day (TID) | INTRAMUSCULAR | Status: DC
  Administered 2016-03-27 – 2016-03-28 (×3): 5000 [IU] via SUBCUTANEOUS
  Filled 2016-03-27 (×3): qty 1

## 2016-03-27 MED ORDER — LACTULOSE 10 GM/15ML PO SOLN
10.0000 g | Freq: Once | ORAL | Status: AC
  Administered 2016-03-27: 10 g via ORAL
  Filled 2016-03-27: qty 15

## 2016-03-27 MED ORDER — ONDANSETRON HCL 4 MG PO TABS: 4.0000 mg | ORAL_TABLET | Freq: Four times a day (QID) | ORAL | Status: DC | PRN

## 2016-03-27 MED ORDER — ONDANSETRON HCL 4 MG/2ML IJ SOLN: 4.0000 mg | Freq: Four times a day (QID) | INTRAMUSCULAR | Status: DC | PRN

## 2016-03-27 MED ORDER — BISACODYL 5 MG PO TBEC
5.0000 mg | DELAYED_RELEASE_TABLET | Freq: Every day | ORAL | Status: DC | PRN
  Filled 2016-03-27: qty 1

## 2016-03-27 NOTE — ED Notes (Signed)
Ambulated pt from RR back to room, pt back on monitor.
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
## 2016-03-27 NOTE — Progress Notes (Signed)
Spoke with Dr. Shon Hale, Neurology. As no further seizures were present since admission,, no Neuro consult is indicated. No imaging studies such as MRI brain or EEG is necessary at this point.  Neuro recommends that the patient follows up as outpatient with his specialist.

## 2016-03-27 NOTE — ED Provider Notes (Signed)
[facility] DEPT Provider Note   CSN: [id number] Arrival date & time: 03/27/16  0010  By signing my name below, I, Oleh Genin, attest that this documentation has been prepared under the direction and in the presence of Delora Fuel, MD. Electronically Signed: Oleh Genin, Scribe. 03/27/16. 12:25 AM.   History   Chief Complaint No chief complaint on file.  LEVEL 5 CAVEAT DUE TO ALTERED MENTAL STATUS  HPI Bryne Wiessner is a 56 y.o. 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 According to a family member at interview, this patient had a witnessed seizure at home this morning. Throughout the day he has been exhibiting behavior out of his normal and EMS was called. According to medic, the patient was acting inappropriately throughout transport; he was flashing his genitals and inappropriately laughing. However he denied any pain or symptoms to medic. Family member denies any known falls.   The history is provided by a relative and the EMS personnel. A language interpreter was used.      Past Medical History:  Diagnosis Date  . Anxiety   . Back pain    low back pain/ compressed disk in lower back.   . CKD (chronic kidney disease), stage II   . Depression   . Hypertension   . Language barrier 03-01-13   Speaks very little English,primary-Vietnamese "[name] Dialect"  . Seizures ([place])    due head trauma-seizures are less, but occurs from mild to more severe, which causes agitation to mental state.  [name] TBI (traumatic brain injury) ([place])    CARE FOR BY BROTHER    Patient Active Problem List   Diagnosis Date Noted  . Sepsis ([place]) 07/03/2015  . CKD (chronic kidney disease), stage II   . Abdominal pain   . Seizures ([place])   . Encounter for therapeutic drug monitoring 01/03/2014  . Partial epilepsy with impairment of consciousness,  intractable ([place]) 03/13/2013  . 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 Encounter for long-term (current) use of other medications 03/13/2013  . Weakness generalized 08/05/2012  . Pneumonia 08/04/2012  . HTN (hypertension) 08/04/2012  . Seizure ([place]) 08/04/2012  . Depression 08/04/2012  . Lower extremity pain 12/21/2011  . Dilantin toxicity 12/19/2011  . Encephalopathy 12/19/2011  . Nicole Kindred will get Wo within enhancing 9 101 which it now 500 or iron ad lib. there is no improvement in right 12/18/2011  . Seizure ([place]) 12/17/2011  . Altered mental state 12/17/2011    Past Surgical History:  Procedure Laterality Date  . ABDOMINAL SURGERY    . COLONOSCOPY N/A 03/17/2013   Procedure: COLONOSCOPY;  Surgeon: [name], MD;  Location: [facility];  Service: Endoscopy;  Laterality: N/A;  . NO PAST SURGERIES     ? one surgery in [place]-? what, has abdominal scar.       Home Medications    Prior to Admission medications   Medication Sig Start Date End Date Taking? Authorizing Provider  levETIRAcetam (KEPPRA) 750 MG tablet Take 2 tablets (1,500 mg total) by mouth 2 (two) times daily. 07/06/15   Reyne Dumas, MD  lisinopril (PRINIVIL,ZESTRIL) 10 MG tablet Take 10 mg by mouth daily.    Historical Provider, MD  phenytoin (DILANTIN) 200 MG ER capsule Take 1 capsule (200 mg total) by mouth 2 (two) times daily. 03/26/16   Dennie Bible, NP  Family History Family History  Problem Relation Age of Onset  . Seizures Mother     per his nonbiological brother    Social History Social History  Substance Use Topics  . Smoking status: Former Smoker    Quit date: 03/02/1989  . Smokeless tobacco: Never Used  . Alcohol use No     Allergies   Patient has no known allergies.   Review of Systems Review of Systems  Unable to perform ROS: Mental status change     Physical Exam Updated Vital Signs BP 146/74 (BP Location: Right Arm)   Pulse  90   Temp 98.4 F (36.9 C) (Oral)   Resp 23   SpO2 100%   Physical Exam   ED Treatments / Results  DIAGNOSTIC STUDIES: Oxygen Saturation is 100 percent on room air which is normal by my interpretation.    COORDINATION OF CARE: 12:21 AM Discussed treatment plan with pt at bedside and pt agreed to plan.  Labs (all labs ordered are listed, but only abnormal results are displayed) Labs Reviewed  COMPREHENSIVE METABOLIC PANEL - Abnormal; Notable for the following:       Result Value   Creatinine, Ser 1.38 (*)    GFR calc non Af Amer 55 (*)    All other components within normal limits  URINALYSIS, ROUTINE W REFLEX MICROSCOPIC - Abnormal; Notable for the following:    Hgb urine dipstick SMALL (*)    All other components within normal limits  CBC WITH DIFFERENTIAL/PLATELET  PHENYTOIN LEVEL, TOTAL  RAPID URINE DRUG SCREEN, HOSP PERFORMED  ETHANOL    Radiology Ct Head Wo Contrast  Result Date: 03/27/2016 CLINICAL DATA:  56 year old male with altered mental status EXAM: CT HEAD WITHOUT CONTRAST TECHNIQUE: Contiguous axial images were obtained from the base of the skull through the vertex without intravenous contrast. COMPARISON:  Head CT dated 07/03/2015 FINDINGS: Brain: The ventricles and sulci appropriate size for patient's age. There is slight asymmetric prominence of the left lateral ventricle. Mild periventricular and deep white matter chronic microvascular ischemic changes noted. There is no acute intracranial hemorrhage. No mass effect or midline shift. No extra-axial fluid collections. Vascular: No hyperdense vessel or unexpected calcification. Skull: Normal. Negative for fracture or focal lesion. Sinuses/Orbits: There is diffuse mucoperiosteal thickening of paranasal sinuses. No air-fluid levels. The mastoid air cells are clear. There is slight dysconjugate gaze. Correlation with clinical exam recommended. Other: None IMPRESSION: No acute intracranial pathology. Mild chronic  microvascular ischemic changes. Chronic appearing paranasal sinus disease. Dysconjugate gaze likely strabismus. Electronically Signed   By: Anner Crete M.D.   On: 03/27/2016 01:21    Procedures Procedures (including critical care time)  Medications Ordered in ED Medications - No data to display   Initial Impression / Assessment and Plan / ED Course  I have reviewed the triage vital signs and the nursing notes.  Pertinent labs & imaging results that were available during my care of the patient were reviewed by me and considered in my medical decision making (see chart for details).  Altered mental status of uncertain cause. Some aspects of his behavior suggests possible psychiatric illness. However, need to rule out underlying medical condition. He has a known history of seizure disorder and, review of old records, shows recent hospitalization for seizure and supratherapeutic phenytoin level. Screening labs are obtained and CT of head is obtained. They do show a slight increase in creatinine over baseline which is probably not clinically significant. Phenytoin level is therapeutic. He had no  change in his mentation while in the ED. Plan is to admit to complete altered mental status workup. If negative, may need psychiatric evaluation. Case is discussed with Dr. Hal Hope of [facility] who agrees to admit the patient under observation status.  Final Clinical Impressions(s) / ED Diagnoses   Final diagnoses:  Altered mental status, unspecified altered mental status type  Renal insufficiency  Seizure disorder [facility])    New Prescriptions New Prescriptions   No medications on file   I personally performed the services described in this documentation, which was scribed in my presence. The recorded information has been reviewed and is accurate.      Delora Fuel, MD [id number]

## 2016-03-27 NOTE — Telephone Encounter (Signed)
I called and spoke to brother Ypai Lofquist, who speaks in broken english.   I did have the interpreter, ID # [id number] on line (vietnamese) on stand by.     I relayed that the labs done in the office were ok, dilantin therapeutic.  Keppra level pending.  He informed that pt was in the [facility] for altered mental status.

## 2016-03-27 NOTE — Care Management Note (Signed)
Case Management Note  Patient Details  Name: [name] MRN: [id number] Date of Birth: 1960/03/24  Subjective/Objective:       [place] man ( non-English speaking ,[name]) with h/o htn, seizures, ckd. Had recent appointment with neurology - appears that he has not been taking his keppra or dilantin. Presents with AMS, confusion,seizure. From home with brother. Independent with ADL's and no DME usage PTA . CM given pastor's #  (Rev. Steva Colder, [phone]) as a contact person to gain insight of patient's baseline.   Action/Plan: Return to home when medically stable. CM to f/u with disposition needs.  Expected Discharge Date:                  Expected Discharge Plan:  Home/Self Care  In-House Referral:  Clinical Social Work  Discharge planning Services  CM Consult  Post Acute Care Choice:    Choice offered to:     DME Arranged:    DME Agency:     HH Arranged:    HH Agency:     Status of Service:  In process, will continue to follow  If discussed at Long Length of Stay Meetings, dates discussed:    Additional Comments:  Sharin Mons, RN 03/27/2016, 3:27 PM

## 2016-03-27 NOTE — ED Notes (Signed)
Patient transported to CT 

## 2016-03-27 NOTE — ED Triage Notes (Signed)
Per EMS, pt from home with c/o "bizarre behavior" starting this morning. Pt's brother states that pt took pictures off the wall with no reason, woke people up in the home, and EMS stated the pt flashed his genitals upon their arrival. VSS.

## 2016-03-27 NOTE — ED Notes (Signed)
Pt stated he is unable to urinate at this time for urine sample.

## 2016-03-28 DIAGNOSIS — G9340 Encephalopathy, unspecified: Secondary | ICD-10-CM

## 2016-03-28 DIAGNOSIS — I10 Essential (primary) hypertension: Secondary | ICD-10-CM | POA: Diagnosis not present

## 2016-03-28 DIAGNOSIS — N179 Acute kidney failure, unspecified: Secondary | ICD-10-CM | POA: Diagnosis not present

## 2016-03-28 LAB — BASIC METABOLIC PANEL
Anion gap: 10 (ref 5–15)
BUN: 12 mg/dL (ref 6–20)
CHLORIDE: 106 mmol/L (ref 101–111)
CO2: 22 mmol/L (ref 22–32)
CREATININE: 1.12 mg/dL (ref 0.61–1.24)
Calcium: 8.6 mg/dL — ABNORMAL LOW (ref 8.9–10.3)
GFR calc Af Amer: >60 mL/min (ref >60)
GFR calc non Af Amer: >60 mL/min (ref >60)
GLUCOSE: 96 mg/dL (ref 65–99)
Potassium: 4 mmol/L (ref 3.5–5.1)
Sodium: 138 mmol/L (ref 135–145)

## 2016-03-28 LAB — CBC
HCT: 38 % — ABNORMAL LOW (ref 39.0–52.0)
Hemoglobin: 12.5 g/dL — ABNORMAL LOW (ref 13.0–17.0)
MCH: 30 pg (ref 26.0–34.0)
MCHC: 32.9 g/dL (ref 30.0–36.0)
MCV: 91.3 fL (ref 78.0–100.0)
PLATELETS: 307 10*3/uL (ref 150–400)
RBC: 4.16 MIL/uL — ABNORMAL LOW (ref 4.22–5.81)
RDW: 13.4 % (ref 11.5–15.5)
WBC: 6.8 10*3/uL (ref 4.0–10.5)

## 2016-03-28 LAB — HEMOGLOBIN A1C
HEMOGLOBIN A1C: 5.8 % — AB (ref 4.8–5.6)
Mean Plasma Glucose: 120 mg/dL

## 2016-03-28 NOTE — Discharge Summary (Signed)
Discharge Summary  [name] [id number] DOB: Nov 30, 1960  PCP: Dr [name]  Admit date: 03/27/2016 Discharge date: 03/28/2016  Time spent: >38mins, more than 50% time spent on coordination of care , patient and brother does not speak english. Plan of care explained through translator ( church friend )  Recommendations for Outpatient Follow-up:  1. F/u with neurology within a week  for hospital discharge follow up , to discuss adjusting seizure medication 2. F/u with psychiatry at [facility] on 1/29 3. F/u with primary care physician, pmd to monitor renal function and ammonia level at hospital follow up.  Discharge Diagnoses:  Active Hospital Problems   Diagnosis Date Noted  . CKD (chronic kidney disease), stage II   . 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 HTN (hypertension) 08/04/2012  . Seizure ([place]) 08/04/2012  . Depression 08/04/2012  . Seizure ([place]) 12/17/2011  . Altered mental state 12/17/2011    Resolved Hospital Problems   Diagnosis Date Noted Date Resolved  No resolved problems to display.    Discharge Condition: stable  Diet recommendation: heart healthy  There were no vitals filed for this visit.  History of present illness:  Patient coming from:  Home    Chief Complaint:  Acute confusion and seizures   HPI: [name] is a 56 y.o. male  [place] speaking with medical history significant for HTN, seizures, CKD, brought to the ED following a seizure on the morning of presentation, witnessed by family. History is obtained by brother who is at bedside, and speaks limited [place]. Symptoms included bizarre behavior, including uncontrollable laughter and crying, flashing genitals etc. He has been showing aggressive behavior at home, threatening to leave the house.  No apparent falls. He had head trauma in the 1980s. Other focal complaints included decreased urine output without hematuria in the setting of decreased oral  fluid intake. No apparent chest pain, shortness of breath, back pain, rash, fevers, cough, headache, or neck stiffness. No recent URI. No ETOH, tobacco or recreational drugs.  He has been last seen by Neurology at the office on 1/23 at which time other lab workup had been initiated. Patient has not been compliant with his dilantin,  and missed a dose a day prior to presentation. Last admission for seizures was on 03/21/15  At the time of evaluation patient appears calm and able to follow simple commands. Does not appear confused at the moment.     ED Course:  BP 113/83   Pulse 87   Temp 98.4 F (36.9 C) (Oral)   Resp 13   Ht 5\' 4"  (1.626 m)   SpO2 95%    today Dilantin levels are norma at 10.8 (were 0.8 on 1/23) . 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 Afebrile. VSS.  CT of the head without acute abnormality.  WBC normal  ETOH negative, UDS negative.     Dilantin level 10.8, keppra level 22.9  tsh 0.7, b12 186, rpr negative, HIV negative, a1c 5.8  Ammonia level mildly elevated at 48, ct ab from 07/2015 live  unremarkable, lft wnl   Per neurology  Dr. Shon Hale, " As no further seizures were present since admission,, no Neuro consult is indicated. No imaging studies such as MRI brain or EEG is necessary at this point.  Neuro recommends that the patient follows up as outpatient with his specialist. "  No seizures during this admission, Confusion has resolved. Patient is discharged home with close follow up with neurology next week He is also going to see psychiatry on 1/29.      Acute  on Chronic kidney disease stage 2 , in the setting of decreased oral fluid intake, ACE I .  baseline creatinine  1   Current Cr 1.38 . Exam unremarkable. UA negative  Recent Labs       Lab Results  Component Value Date   CREATININE 1.38 (H) 03/27/2016   CREATININE 1.22 03/25/2016   CREATININE 0.59 (L) 03/24/2016    Cr nornalized with hydration, encourage oral intake, repeat bmp at hospital follow up.    Hypertension BP 113/83   Pulse 87    Controlled Continue home anti-hypertensive medications lisinopril  Depression Patient not on antidepressants.     DVT prophylaxis while in the hospital:   Heparin   Code Status:   Full     Family Communication:  Discussed with patient brother and a church friend who helped translate Disposition Plan:  discharged to home on 1/27 Consults called:    Neurology   Procedures:  none    Discharge Exam: BP 109/62 (BP Location: Left Arm)   Pulse 71   Temp 98.2 F (36.8 C)   Resp 20   Ht 5\' 4"  (1.626 m)   SpO2 98%   General: NAD Cardiovascular: RRR Respiratory: CTABL  Discharge Instructions You were cared for by a hospitalist during your hospital stay. If you have any questions about your discharge medications or the care you received while you were in the hospital after you are discharged, you can call the unit and asked to speak with the hospitalist on call if the hospitalist that took care of you is not available. Once you are discharged, your primary care physician will handle any further medical issues. Please note that NO REFILLS for any discharge medications will be authorized once you are discharged, as it is imperative that you return to your primary care physician (or establish a relationship with a primary care physician if you do not have one) for your aftercare needs so that they can reassess your need for medications and monitor your lab values.  Discharge Instructions    Diet - low sodium heart healthy    Complete  by:  As directed    Increase activity slowly    Complete by:  As directed      Allergies as of 03/28/2016   No Known Allergies     Medication List    TAKE these medications   levETIRAcetam 1000 MG tablet Commonly known as:  KEPPRA Take 1,500 mg by mouth 2 (two) times daily. What changed:  Another medication with the same name was removed. Continue taking this medication, and follow the directions you see here.   lisinopril 10 MG tablet Commonly known as:  PRINIVIL,ZESTRIL Take 10 mg by mouth daily.   phenytoin 200 MG ER capsule Commonly known as:  DILANTIN Take 1 capsule (200 mg total) by mouth 2 (two) times daily.      No Known Allergies Follow-up Information    MARTIN,NANCY CAROLYN, NP Follow up in 1 week(s).  Specialty:  Family Medicine Why:  seizure,  please discuss with your neurologist about dose adjustment for the two medications you have been taking ( phenytoin and levetriracetam.)  please also discuss another seizure medication use (vimpat) Contact information: [address] [place] [address] [phone]        follow up with psychiatry at [facility] Follow up on 03/30/2016.        Philis Fendt, MD Follow up in 2 week(s).   Specialty:  Internal Medicine Why:  with your primary care doctor for hospital discharge follow up repeat cbc/bmp/ammonia level at follow up. Contact information: [place] [address] [phone]            The results of significant diagnostics from this hospitalization (including imaging, microbiology, ancillary and laboratory) are listed below for reference.    Significant Diagnostic Studies: Ct Head Wo Contrast  Result Date: 03/27/2016 CLINICAL DATA:  56 year old male with altered mental status EXAM: CT HEAD WITHOUT CONTRAST TECHNIQUE: Contiguous axial images were obtained from the base of the skull through the vertex without intravenous contrast. COMPARISON:  Head CT dated  07/03/2015 FINDINGS: Brain: The ventricles and sulci appropriate size for patient's age. There is slight asymmetric prominence of the left lateral ventricle. Mild periventricular and deep white matter chronic microvascular ischemic changes noted. There is no acute intracranial hemorrhage. No mass effect or midline shift. No extra-axial fluid collections. Vascular: No hyperdense vessel or unexpected calcification. Skull: Normal. Negative for fracture or focal lesion. Sinuses/Orbits: There is diffuse mucoperiosteal thickening of paranasal sinuses. No air-fluid levels. The mastoid air cells are clear. There is slight dysconjugate gaze. Correlation with clinical exam recommended. Other: None IMPRESSION: No acute intracranial pathology. Mild chronic microvascular ischemic changes. Chronic appearing paranasal sinus disease. Dysconjugate gaze likely strabismus. Electronically Signed   By: Anner Crete M.D.   On: 03/27/2016 01:21    Microbiology: No results found for this or any previous visit (from the past 240 hour(s)).   Labs: Basic Metabolic Panel:  Recent Labs Lab 03/24/16 0938 03/25/16 1316 03/27/16 0115 03/27/16 0842 03/28/16 0527  NA 126* 138 137  --  138  K 4.1 4.1 3.6  --  4.0  CL 84* 100 103  --  106  CO2 26 24 25   --  22  GLUCOSE 91 122* 99  --  96  BUN 9 14 11   --  12  CREATININE 0.59* 1.22 1.38*  --  1.12  CALCIUM 9.0 9.2 9.1  --  8.6*  MG  --   --   --  1.8  --    Liver Function Tests:  Recent Labs Lab 03/24/16 0938 03/25/16 1316 03/27/16 0115  AST 28 26 25   ALT 7 17 19   ALKPHOS 64 78 66  BILITOT 0.6 0.2 0.5  PROT 7.7 7.9 7.5  ALBUMIN 3.6 4.0 3.8   No results for input(s): LIPASE, AMYLASE in the last 168 hours.  Recent Labs Lab 03/27/16 0801  AMMONIA 48*   CBC:  Recent Labs Lab 03/24/16 0938 03/25/16 1316 03/27/16 0115 03/28/16 0527  WBC 15.3* 7.3 9.2 6.8  NEUTROABS 9.9* 4.8 5.5  --   HGB  --   --  13.4 12.5*  HCT 33.5* 39.4 39.8 38.0*  MCV 87 90  91.3 91.3  PLT 141* 359 357 307   Cardiac Enzymes:  Recent Labs Lab 03/27/16 0842  CKTOTAL 302  TROPONINI <0.03   BNP: BNP (last 3 results) No results for input(s):  BNP in the last 8760 hours.  ProBNP (last 3 results) No results for input(s): PROBNP in the last 8760 hours.  CBG: No results for input(s): GLUCAP in the last 168 hours.     SignedFlorencia Reasons MD, PhD  [facility] 03/28/2016, 1:12 PM

## 2016-03-28 NOTE — Progress Notes (Signed)
Pt given discharge instructions, prescriptions, and care notes. Pt verbalized understanding AEB no further questions or concerns at this time. IV was discontinued, no redness, pain, or swelling noted at this time. Telemetry discontinued and Centralized Telemetry was notified. Pt left the floor via wheelchair with staff in stable condition. Interpreter called and all questions answered.

## 2016-03-30 DIAGNOSIS — F329 Major depressive disorder, single episode, unspecified: Secondary | ICD-10-CM | POA: Diagnosis not present

## 2016-04-01 DIAGNOSIS — F329 Major depressive disorder, single episode, unspecified: Secondary | ICD-10-CM | POA: Diagnosis not present

## 2016-04-07 NOTE — Progress Notes (Signed)
Order(s) created erroneously. Erroneous order ID: [id number]  Order moved by: Tiney Rouge  Order move date/time: 04/07/2016 1:25 PM  Source Patient: [id number]  Source Contact: 03/24/2016  Destination Patient: [id number]  Destination Contact: 05/17/2012  Erroneous order ID: [id number]  Order moved by: Tiney Rouge  Order move date/time: 04/07/2016 1:25 PM  Source Patient: [id number]  Source Contact: 03/24/2016  Destination Patient: [id number]  Destination Contact: 05/17/2012  Erroneous order ID: [id number]  Order moved by: Tiney Rouge  Order move date/time: 04/07/2016 1:25 PM  Source Patient: [id number]  Source Contact: 03/24/2016  Destination Patient: [id number]  Destination Contact: 05/17/2012  Erroneous order ID: [id number]  Order moved by: Tiney Rouge  Order move date/time: 04/07/2016 1:25 PM  Source Patient: [id number]  Source Contact: 03/24/2016  Destination Patient: [id number]  Destination Contact: 05/17/2012

## 2016-04-15 NOTE — Congregational Nurse Program (Signed)
Congregational Nurse Program Note  Date of Encounter: 04/15/2016  Past Medical History: Past Medical History:  Diagnosis Date  . Anxiety   . Back pain    low back pain/ compressed disk in lower back.   . CKD (chronic kidney disease), stage II   . Depression   . Hypertension   . Language barrier 03-01-13   Speaks very little English,primary-Vietnamese "[name] Dialect"  . Seizures ([place])    due head trauma-seizures are less, but occurs from mild to more severe, which causes agitation to mental state.  [name] TBI (traumatic brain injury) ([place])    CARE FOR BY BROTHER    Encounter Details:  [employer] intern Antony Contras assisted patient with Medicaid and Food Stamp applications.  Jake Michaelis RN, Congregational Nurse [phone].     CNP Questionnaire - 04/15/16 1222      Patient Demographics   Is this a new or existing patient? Existing   Patient is considered a/an Refugee   Race Asian     Patient Assistance   Location of Patient Assistance Not Applicable   Patient's financial/insurance status Medicaid;Low Income;Medicare   Uninsured Patient ([facility]) No   Patient referred to apply for the following financial assistance Not Applicable   Food insecurities addressed Not Applicable   Transportation assistance No   Assistance securing medications No   Educational health offerings Not Applicable     Encounter Details   Primary purpose of visit Other   Was an Emergency Department visit averted? Not Applicable   Does patient have a medical provider? Yes   Patient referred to Other   Was a mental health screening completed? (GAINS tool) No   Does patient have dental issues? No   Does patient have vision issues? No   Does your patient have an abnormal blood pressure today? No   Since previous encounter, have you referred patient for abnormal blood pressure that resulted in a new diagnosis or medication change? No   Does your patient have an abnormal blood glucose  today? No   Since previous encounter, have you referred patient for abnormal blood glucose that resulted in a new diagnosis or medication change? No   Was there a life-saving intervention made? No

## 2016-04-28 DIAGNOSIS — F329 Major depressive disorder, single episode, unspecified: Secondary | ICD-10-CM | POA: Diagnosis not present

## 2016-05-10 NOTE — Progress Notes (Signed)
Personally  participated in, made any corrections needed, and agree with history, physical, neuro exam,assessment and plan as stated above.    Antonia Ahern, MD [facility] 

## 2016-05-28 ENCOUNTER — Other Ambulatory Visit: Payer: Self-pay | Admitting: Nurse Practitioner

## 2016-06-24 DIAGNOSIS — F329 Major depressive disorder, single episode, unspecified: Secondary | ICD-10-CM | POA: Diagnosis not present

## 2016-09-16 DIAGNOSIS — F329 Major depressive disorder, single episode, unspecified: Secondary | ICD-10-CM | POA: Diagnosis not present

## 2016-09-22 ENCOUNTER — Ambulatory Visit: Payer: Medicare Other | Admitting: Nurse Practitioner

## 2016-09-22 NOTE — Progress Notes (Deleted)
[facility]  PATIENT: [name] DOB: 09-Nov-1960   REASON FOR VISIT: follow-up for generalized epilepsy HISTORY FROM: Interpreter agent does not speak English    HISTORY OF PRESENT ILLNESS:[name], 56 year old male returns for followup. He does not speak [place]. He is accompanied by his 24/ 7 caregiver his brother and an interpreter. Neither brother or patient speak [place].. He has a seizure disorder and is currently on Dilantin 400 mg daily, Keppra 1500 mg twice daily. His last seizure activity was last week, he had 2 seizure-like events with loss of consciousness with each episode lasting a few minutes. The patient denies missing any doses of medication . The patient has had seizures in the past with noncompliance. Appetite is reportedly good, sleeps well at night. No falls, no balance issues.He returns for reevaluation .   HISTORY: He does not speak [place] and understands very little spoken [place]. He has a caregiver with him today who intreprets. By history he has mild cerebral palsy and a possible head injury in childhood. He has hx of gout and HTN. He has a 4th grade education and lives with his caregiver.  His brother says his seizures have been in good control. He continues to have problems with short term memory and learning new information. He has no problems with day to day functioning. He is independent with ADL's. He had an admission to inpatient Behavorial health in January and June for uncontrolled anger.   He has been tapered off phenobarbital, he is not taking Dilantin 100 mg 2 tablets twice a day, levetiracetam 1000mg  1 and a half tablets twice a day. He continues to have seizures, the longest    REVIEW OF SYSTEMS: Full 14 system review of systems performed and notable only for those listed, all others are neg:  Constitutional: Fatigue  Cardiovascular: neg Ear/Nose/Throat: neg  Skin: neg Eyes: neg Respiratory: neg Gastroitestinal: neg    Hematology/Lymphatic: neg  Endocrine: neg Musculoskeletal:neg Allergy/Immunology: neg Neurological: seizure disorder,  Psychiatric: neg Sleep : neg   ALLERGIES: No Known Allergies  HOME MEDICATIONS: Outpatient Medications Prior to Visit  Medication Sig Dispense Refill  . levETIRAcetam (KEPPRA) 1000 MG tablet Take 1,500 mg by mouth 2 (two) times daily.    [name] lisinopril (PRINIVIL,ZESTRIL) 10 MG tablet Take 10 mg by mouth daily.    . phenytoin (DILANTIN) 200 MG ER capsule Take 1 capsule (200 mg total) by mouth 2 (two) times daily. 60 capsule 6   No facility-administered medications prior to visit.     PAST MEDICAL HISTORY: Past Medical History:  Diagnosis Date  . Anxiety   . Back pain    low back pain/ compressed disk in lower back.   . CKD (chronic kidney disease), stage II   . Depression   . Hypertension   . Language barrier 03-01-13   Speaks very little English,primary-Vietnamese "[name] Dialect"  . Seizures ([place])    due head trauma-seizures are less, but occurs from mild to more severe, which causes agitation to mental state.  [name] TBI (traumatic brain injury) ([place])    CARE FOR BY BROTHER    PAST SURGICAL HISTORY: Past Surgical History:  Procedure Laterality Date  . ABDOMINAL SURGERY    . COLONOSCOPY N/A 03/17/2013   Procedure: COLONOSCOPY;  Surgeon: [name], MD;  Location: [facility];  Service: Endoscopy;  Laterality: N/A;  . NO PAST SURGERIES     ? one surgery in [place]-? what, has abdominal scar.    FAMILY HISTORY: Family History  Problem Relation Age of Onset  . Seizures Mother        per his nonbiological brother    SOCIAL HISTORY: Social History   Social History  . Marital status: Single    Spouse name: N/A  . Number of children: N/A  . Years of education: 4   Occupational History  .  Unemployed   Social History Main Topics  . Smoking status: Former Smoker    Quit date: 03/02/1989  . Smokeless tobacco: Never Used  . Alcohol use No  .  Drug use: No  . Sexual activity: Not Currently   Other Topics Concern  . Not on file   Social History Narrative   ** Merged History Encounter **    Patient is single.   Patient is disabled.   Patient has a 4th grade education.   Patient drinks two cups of coffee daily.     PHYSICAL EXAM  There were no vitals filed for this visit. There is no height or weight on file to calculate BMI. Generalized: Well developed, in no acute distress  Head: normocephalic and atraumatic,. Oropharynx benign  Musculoskeletal: No deformity   Neurological examination  Mentation: Alert oriented to time, place, history taking. Follows all commands , does not speak or understand [place].   Cranial nerve II-XII: Pupils were equal round reactive to light extraocular movements were full, visual field were full on confrontational test. Left exotropia. Facial sensation and strength were normal. hearing was intact to finger rubbing bilaterally. Uvula tongue midline. head turning and shoulder shrug were normal and symmetric.Tongue protrusion into cheek strength was normal. Motor: normal bulk and tone, full strength in the BUE, BLE,  Sensory: normal and symmetric to light touch, pinprick, and vibration , withdraws to pain Coordination: finger-nose-finger, heel-to-shin bilaterally, no dysmetria Reflexes: 1+ upper lower and symmetric  Gait and Station: Rising up from seated position without assistance, normal stance, moderate stride, good arm swing, smooth turning, no assistive device. Tandem gait is steady  DIAGNOSTIC DATA (LABS, IMAGING, TESTING) - I reviewed patient records, labs, notes, testing and imaging myself where available.  Lab Results  Component Value Date   WBC 6.8 03/28/2016   HGB 12.5 (L) 03/28/2016   HCT 38.0 (L) 03/28/2016   MCV 91.3 03/28/2016   PLT 307 03/28/2016      Component Value Date/Time   NA 138 03/28/2016 0527   NA 138 03/25/2016 1316   K 4.0 03/28/2016 0527   CL 106  03/28/2016 0527   CO2 22 03/28/2016 0527   GLUCOSE 96 03/28/2016 0527   BUN 12 03/28/2016 0527   BUN 14 03/25/2016 1316   CREATININE 1.12 03/28/2016 0527   CALCIUM 8.6 (L) 03/28/2016 0527   PROT 7.5 03/27/2016 0115   PROT 7.9 03/25/2016 1316   ALBUMIN 3.8 03/27/2016 0115   ALBUMIN 4.0 03/25/2016 1316   AST 25 03/27/2016 0115   ALT 19 03/27/2016 0115   ALKPHOS 66 03/27/2016 0115   BILITOT 0.5 03/27/2016 0115   BILITOT 0.2 03/25/2016 1316   GFRNONAA >60 03/28/2016 0527   GFRAA >60 03/28/2016 0527       ASSESSMENT AND PLAN  56 y.o. year old male  has a past medical history of Anxiety; Back pain; CKD (chronic kidney disease), stage II; Depression; Hypertension; Language barrier (03-01-13); Seizures ([place]); and TBI (traumatic brain injury) ([place]). here to follow up For his seizure disorder. He had 2 seizures last week.The patient is a current patient of Dr. Krista Blue  who is out  of the office today . This note is sent to the work in doctor.      Will check labs, CBC, CMP and Dilantin level and Keppra level in am patient took am doses 1 hour ago RX for both Dilantin and Keppra  To be renewed when labs back F/U in 6 months Call for seizure activity Greater than 50% of time during this 15 minute visit was spent on counseling,explanation of diagnosis, planning of further management, discussion with patient and family through the interpreter of why  the labs cannot be done today , and coordination of care. His medications will refill. Refill once I get his labs back. I may need to adjust his medications. Dennie Bible, [facility], [facility], APRN  [facility] [address] [place], [place] [address] [phone]

## 2016-09-23 ENCOUNTER — Encounter: Payer: Self-pay | Admitting: Nurse Practitioner

## 2016-10-02 DIAGNOSIS — Z125 Encounter for screening for malignant neoplasm of prostate: Secondary | ICD-10-CM | POA: Diagnosis not present

## 2016-10-02 DIAGNOSIS — Z13220 Encounter for screening for lipoid disorders: Secondary | ICD-10-CM | POA: Diagnosis not present

## 2016-10-02 DIAGNOSIS — F329 Major depressive disorder, single episode, unspecified: Secondary | ICD-10-CM | POA: Diagnosis not present

## 2016-10-02 DIAGNOSIS — E559 Vitamin D deficiency, unspecified: Secondary | ICD-10-CM | POA: Diagnosis not present

## 2016-10-02 DIAGNOSIS — G40909 Epilepsy, unspecified, not intractable, without status epilepticus: Secondary | ICD-10-CM | POA: Diagnosis not present

## 2016-10-02 DIAGNOSIS — I10 Essential (primary) hypertension: Secondary | ICD-10-CM | POA: Diagnosis not present

## 2016-10-02 DIAGNOSIS — Z131 Encounter for screening for diabetes mellitus: Secondary | ICD-10-CM | POA: Diagnosis not present

## 2016-11-20 DIAGNOSIS — F329 Major depressive disorder, single episode, unspecified: Secondary | ICD-10-CM | POA: Diagnosis not present

## 2016-11-24 ENCOUNTER — Other Ambulatory Visit: Payer: Self-pay | Admitting: Nurse Practitioner

## 2016-12-24 ENCOUNTER — Other Ambulatory Visit: Payer: Self-pay | Admitting: Nurse Practitioner

## 2016-12-24 NOTE — Telephone Encounter (Signed)
I called [employer] interpre[employer]. [place], [place].  She attempted to call pts brother/cousin at # listed and no answer. I will try tomorrow.  Needing phenytoin refill, and needs appt scheduled.

## 2017-01-01 DIAGNOSIS — J302 Other seasonal allergic rhinitis: Secondary | ICD-10-CM | POA: Diagnosis not present

## 2017-01-01 DIAGNOSIS — I10 Essential (primary) hypertension: Secondary | ICD-10-CM | POA: Diagnosis not present

## 2017-01-01 DIAGNOSIS — G40909 Epilepsy, unspecified, not intractable, without status epilepticus: Secondary | ICD-10-CM | POA: Diagnosis not present

## 2017-01-01 DIAGNOSIS — M5136 Other intervertebral disc degeneration, lumbar region: Secondary | ICD-10-CM | POA: Diagnosis not present

## 2017-01-01 MED ORDER — PHENYTOIN SODIUM EXTENDED 200 MG PO CAPS: 200.0000 mg | ORAL_CAPSULE | Freq: Two times a day (BID) | ORAL | 2 refills | Status: DC

## 2017-01-01 NOTE — Telephone Encounter (Signed)
Patient has appointment scheduled for 03/2017. I have sent refill for phenytoin to his pharmacy.

## 2017-01-01 NOTE — Addendum Note (Signed)
Addended by: Belinda Block A on: 01/01/2017 10:57 AM   Modules accepted: Orders

## 2017-01-20 DIAGNOSIS — F329 Major depressive disorder, single episode, unspecified: Secondary | ICD-10-CM | POA: Diagnosis not present

## 2017-01-20 NOTE — Congregational Nurse Program (Signed)
Congregational Nurse Program Note  Date of Encounter: 01/20/2017  Past Medical History: Past Medical History:  Diagnosis Date  . Anxiety   . Back pain    low back pain/ compressed disk in lower back.   . CKD (chronic kidney disease), stage II   . Depression   . Hypertension   . Language barrier 03-01-13   Speaks very little English,primary-Vietnamese "[name] Dialect"  . Seizures ([place])    due head trauma-seizures are less, but occurs from mild to more severe, which causes agitation to mental state.  [name] TBI (traumatic brain injury) ([place])    CARE FOR BY BROTHER    Encounter Details:  CN office visit with interpreter Diu Hartshorn assisting.  Patient received "New Patient Packet" from [facility] for 02/03/2017 appointment and needs help filling out the questionnaire. Information completed and he will take it with him to the appointment.  Jake Michaelis RN, Congregational Nurse [phone] CNP Questionnaire - 01/20/17 2212      Questionnaire   Patient Status  Refugee    Race  Asian    Location Patient Served At  Not Applicable    Insurance  Medicaid;Medicare    Uninsured  Not Applicable    Food  No food insecurities    Housing/Utilities  Yes, have permanent housing    Transportation  No transportation needs    Interpersonal Safety  Yes, feel physically and emotionally safe where you currently live    Medication  No medication insecurities    Medical Provider  Yes    Referrals  Not Applicable    ED Visit Averted  Not Applicable    Life-Saving Intervention Made  Not Applicable

## 2017-02-03 DIAGNOSIS — H25813 Combined forms of age-related cataract, bilateral: Secondary | ICD-10-CM | POA: Diagnosis not present

## 2017-02-03 DIAGNOSIS — H15832 Staphyloma posticum, left eye: Secondary | ICD-10-CM | POA: Diagnosis not present

## 2017-02-17 IMAGING — CR DG LUMBAR SPINE COMPLETE 4+V
5 series · 5 of 5 positions shown · non-contrast
Comparison: None.

CLINICAL DATA: Lower back pain for 3 days with no known injury.

EXAM:
LUMBAR SPINE - COMPLETE 4+ VIEW

[t lumbar spine ap]
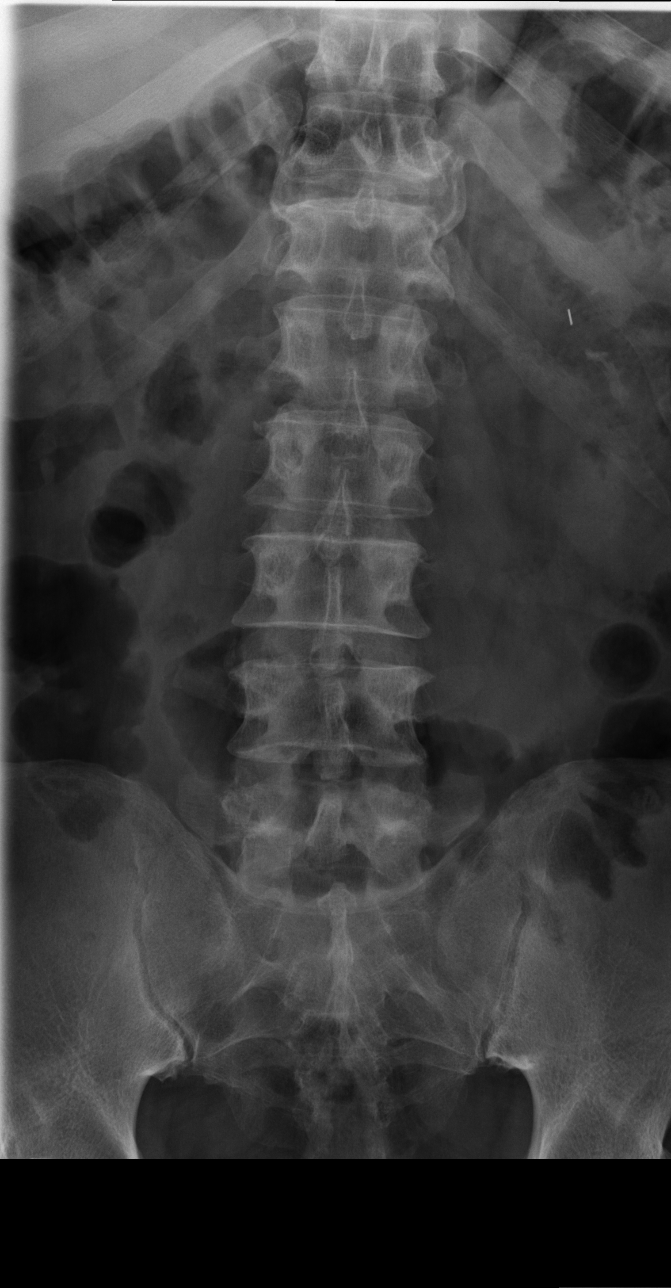

[t lumbar spine obl (1 of 2)]
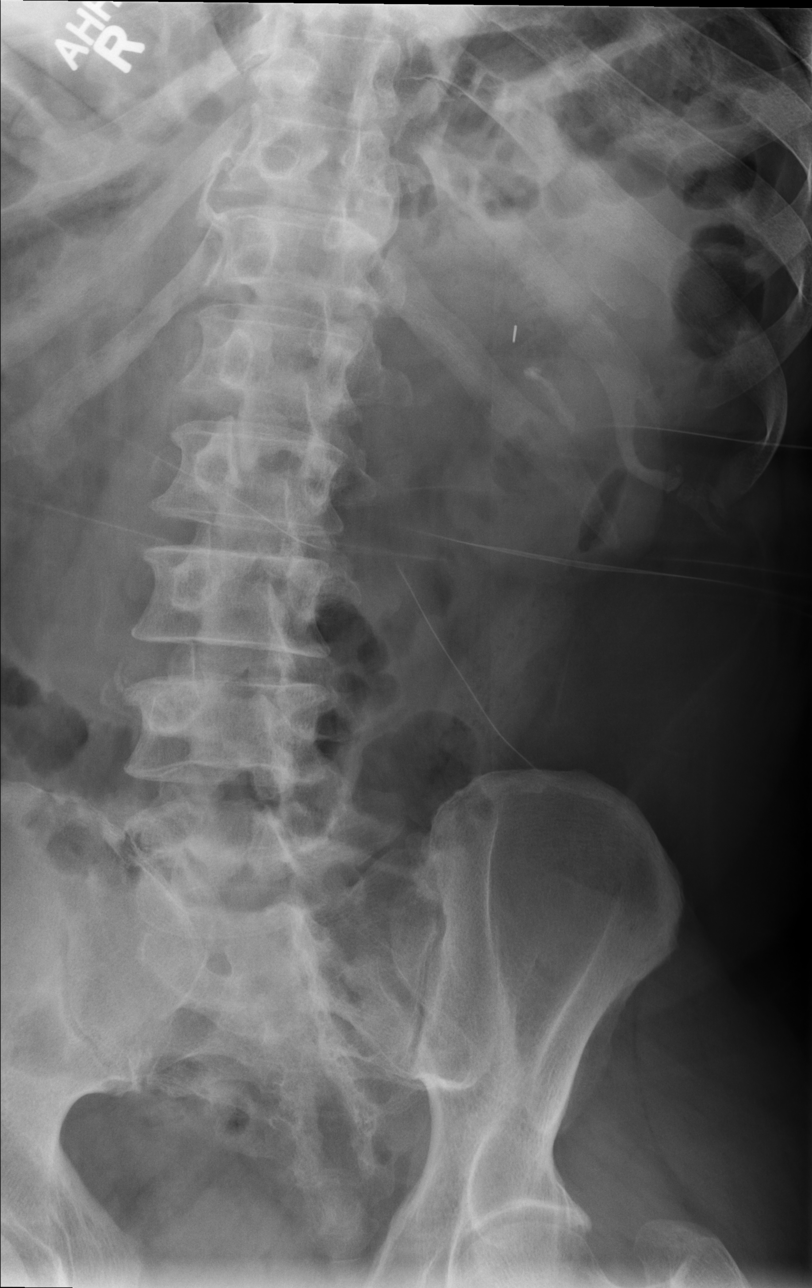

[t lumbar spine obl (2 of 2)]
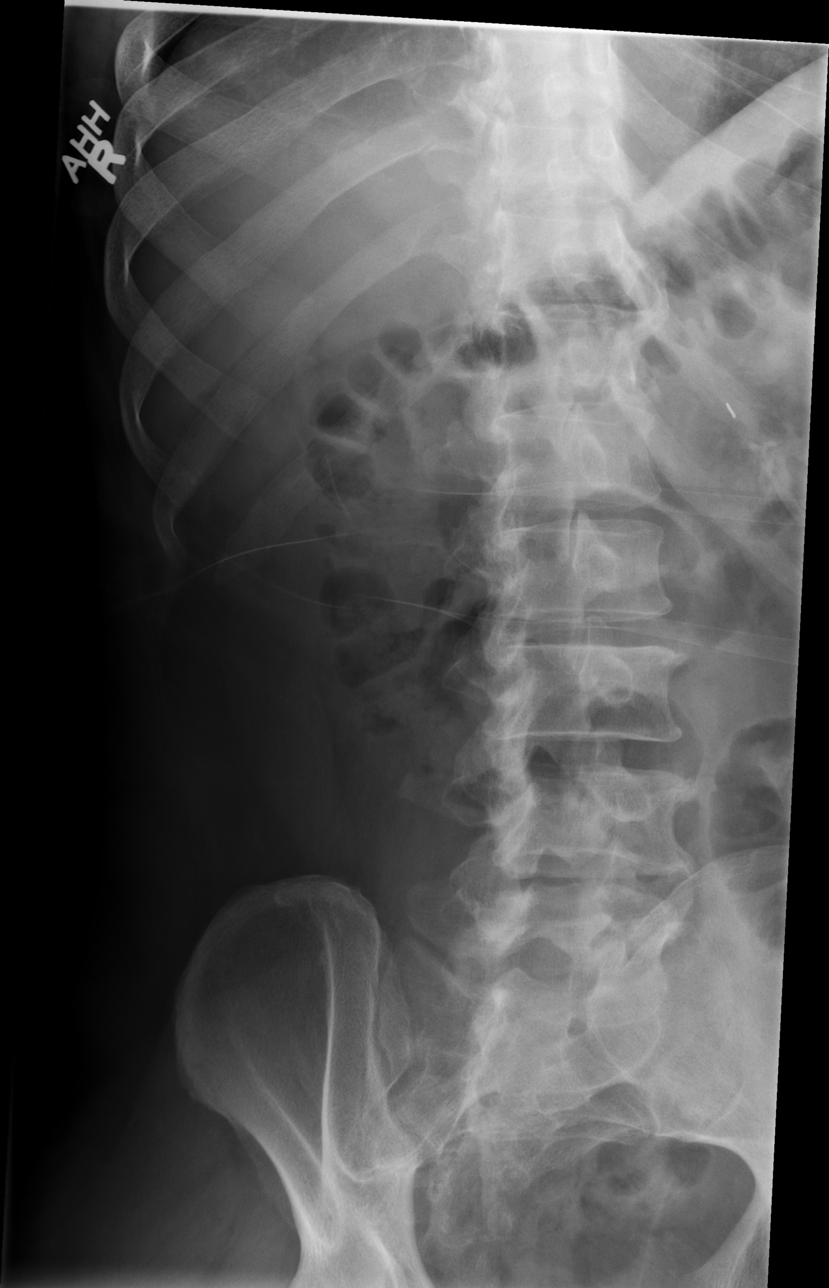

[t lumbar spine lat]
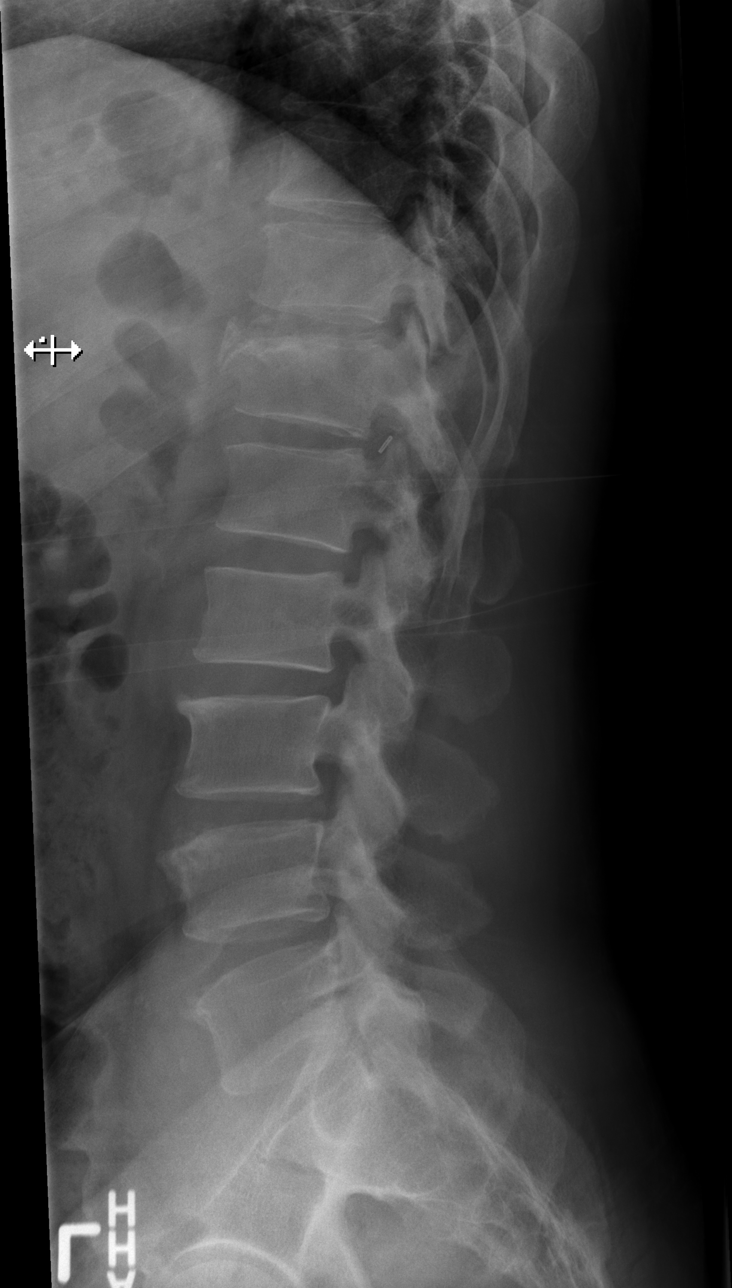

[t lumbar l-5 s-1 spot]
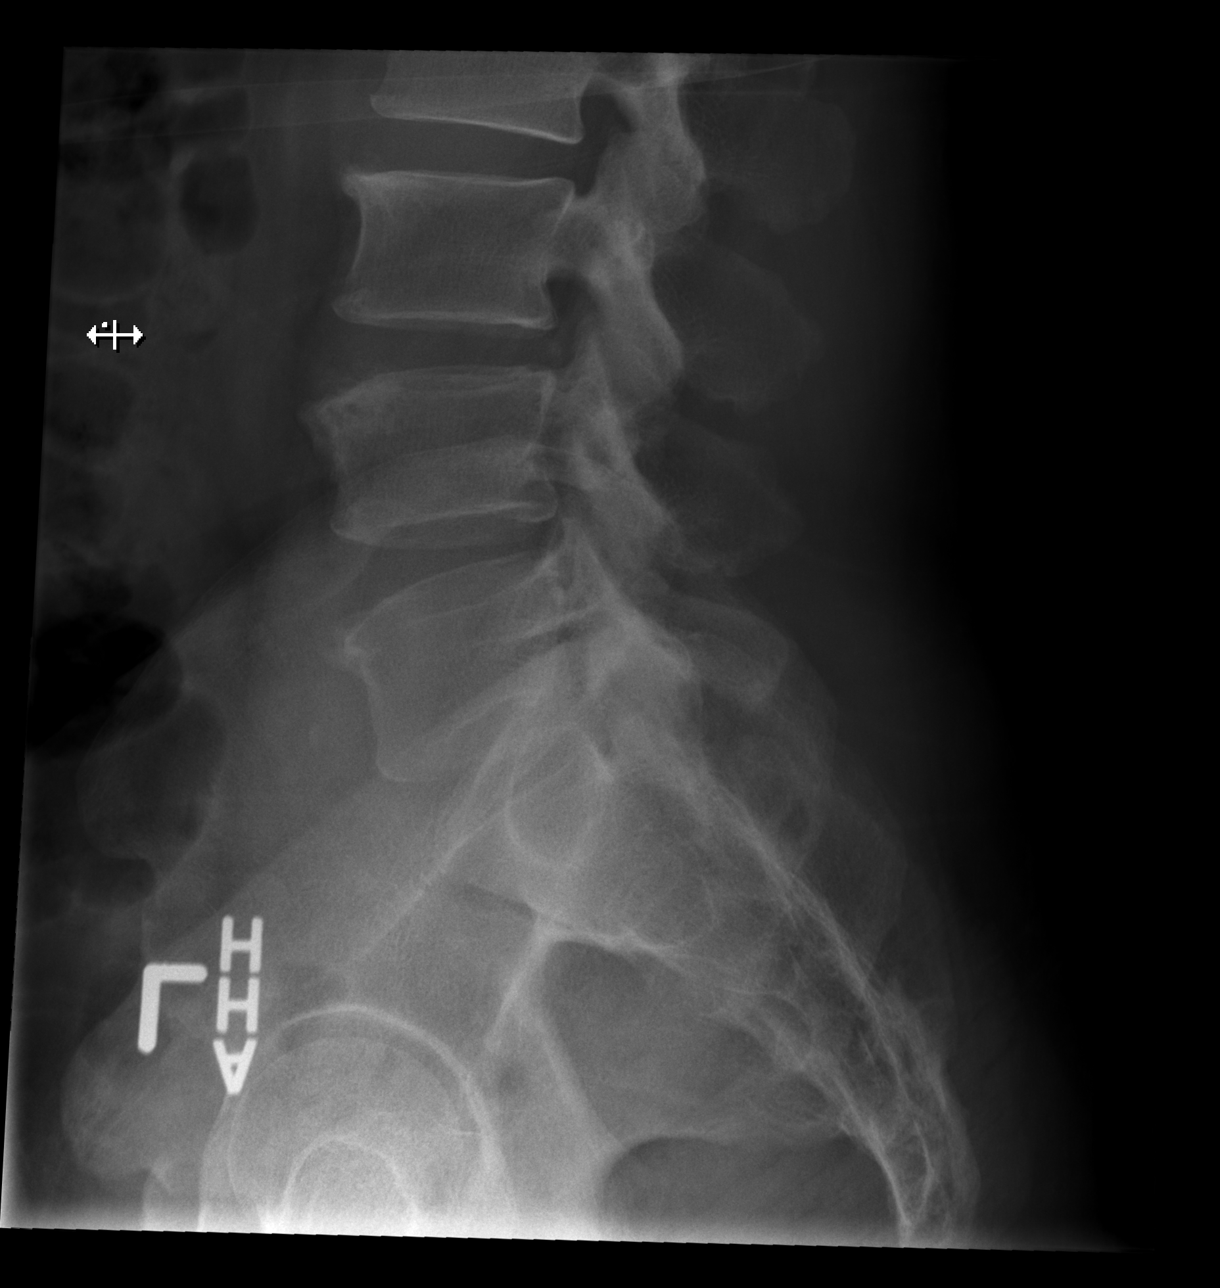

[5 of 5 positions shown; findings below may reference images not displayed]

FINDINGS: There is a wedge-shaped deformity of the T12 vertebrae with mild
loss of the anterior vertebral body height. There is mild sclerosis
and irregularity along superior endplate with anterior osteophyte
formation. This is likely chronic.

All remaining vertebrae and all pt did

There is no spondylolisthesis.

Mild loss disc height from L2-L3 through L4-L5. There are small
endplate osteophytes 3 4 L5.

Facet joints are relatively well preserved.

Soft tissues are unremarkable.
IMPRESSION: 1. Mild compression deformity at T12 which appears chronic.
2. No convincing acute fracture.
3. Mild disc degenerative change.

## 2017-03-17 DIAGNOSIS — F329 Major depressive disorder, single episode, unspecified: Secondary | ICD-10-CM | POA: Diagnosis not present

## 2017-03-17 NOTE — Progress Notes (Signed)
[facility]  PATIENT: [name] DOB: 1961-02-11   REASON FOR VISIT: follow-up for generalized epilepsy HISTORY FROM: pt does not speak English    HISTORY OF PRESENT ILLNESS:UPDATE 03/18/2017.  Patient shows follow-up no interpreter arrived.  Language line called, no interpreter available for [name].  Unable to see patient will reschedule [name], 57 year old male returns for followup. He does not speak [place]. He is accompanied by his 24/ 7 caregiver his brother and an interpreter. Neither brother or patient speak [place].. He has a seizure disorder and is currently on Dilantin 400 mg daily, Keppra 1500 mg twice daily. His last seizure activity was last week, he had 2 seizure-like events with loss of consciousness with each episode lasting a few minutes. The patient denies missing any doses of medication . The patient has had seizures in the past with noncompliance. Appetite is reportedly good, sleeps well at night. No falls, no balance issues.He returns for reevaluation .   HISTORY: He does not speak [place] and understands very little spoken [place]. He has a caregiver with him today who intreprets. By history he has mild cerebral palsy and a possible head injury in childhood. He has hx of gout and HTN. He has a 4th grade education and lives with his caregiver.  His brother says his seizures have been in good control. He continues to have problems with short term memory and learning new information. He has no problems with day to day functioning. He is independent with ADL's. He had an admission to inpatient [facility]havorial health in January and June for uncontrolled anger.   He has been tapered off phenobarbital, he is not taking Dilantin 100 mg 2 tablets twice a day, levetiracetam 1000mg  1 and a half tablets twice a day. He continues to have seizures, the longest    REVIEW OF SYSTEMS: Full 14 system review of systems performed and notable only for those listed,  all others are neg:  Constitutional: Fatigue  Cardiovascular: neg Ear/Nose/Throat: neg  Skin: neg Eyes: neg Respiratory: neg Gastroitestinal: neg  Hematology/Lymphatic: neg  Endocrine: neg Musculoskeletal:neg Allergy/Immunology: neg Neurological: seizure disorder,  Psychiatric: neg Sleep : neg   ALLERGIES: No Known Allergies  HOME MEDICATIONS: Outpatient Medications Prior to Visit  Medication Sig Dispense Refill  . levETIRAcetam (KEPPRA) 1000 MG tablet Take 1,500 mg by mouth 2 (two) times daily.    [name] lisinopril (PRINIVIL,ZESTRIL) 10 MG tablet Take 10 mg by mouth daily.    . phenytoin (DILANTIN) 200 MG ER capsule Take 1 capsule (200 mg total) by mouth 2 (two) times daily. 60 capsule 2   No facility-administered medications prior to visit.     PAST MEDICAL HISTORY: Past Medical History:  Diagnosis Date  . Anxiety   . Back pain    low back pain/ compressed disk in lower back.   . CKD (chronic kidney disease), stage II   . Depression   . Hypertension   . Language barrier 03-01-13   Speaks very little English,primary-Vietnamese "[name] Dialect"  . Seizures ([place])    due head trauma-seizures are less, but occurs from mild to more severe, which causes agitation to mental state.  [name] TBI (traumatic brain injury) ([place])    CARE FOR BY BROTHER    PAST SURGICAL HISTORY: Past Surgical History:  Procedure Laterality Date  . ABDOMINAL SURGERY    . COLONOSCOPY N/A 03/17/2013   Procedure: COLONOSCOPY;  Surgeon: [name], MD;  Location: [facility];  Service: Endoscopy;  Laterality: N/A;  .  NO PAST SURGERIES     ? one surgery in [place]-? what, has abdominal scar.    FAMILY HISTORY: Family History  Problem Relation Age of Onset  . Seizures Mother        per his nonbiological brother    SOCIAL HISTORY: Social History   Socioeconomic History  . Marital status: Single    Spouse name: Not on file  . Number of children: Not on file  . Years of education: 4  .  Highest education level: Not on file  Social Needs  . Financial resource strain: Not on file  . Food insecurity - worry: Not on file  . Food insecurity - inability: Not on file  . Transportation needs - medical: Not on file  . Transportation needs - non-medical: Not on file  Occupational History    Employer: UNEMPLOYED  Tobacco Use  . Smoking status: Former Smoker    Last attempt to quit: 03/02/1989    Years since quitting: 28.0  . Smokeless tobacco: Never Used  Substance and Sexual Activity  . Alcohol use: No  . Drug use: No  . Sexual activity: Not Currently  Other Topics Concern  . Not on file  Social History Narrative   ** Merged History Encounter **    Patient is single.   Patient is disabled.   Patient has a 4th grade education.   Patient drinks two cups of coffee daily.     PHYSICAL EXAM  There were no vitals filed for this visit. There is no height or weight on file to calculate BMI. Generalized: Well developed, in no acute distress  Head: normocephalic and atraumatic,. Oropharynx benign  Musculoskeletal: No deformity   Neurological examination  Mentation: Alert oriented to time, place, history taking. Follows all commands , does not speak or understand [place].   Cranial nerve II-XII: Pupils were equal round reactive to light extraocular movements were full, visual field were full on confrontational test. Left exotropia. Facial sensation and strength were normal. hearing was intact to finger rubbing bilaterally. Uvula tongue midline. head turning and shoulder shrug were normal and symmetric.Tongue protrusion into cheek strength was normal. Motor: normal bulk and tone, full strength in the BUE, BLE,  Sensory: normal and symmetric to light touch, pinprick, and vibration , withdraws to pain Coordination: finger-nose-finger, heel-to-shin bilaterally, no dysmetria Reflexes: 1+ upper lower and symmetric  Gait and Station: Rising up from seated position without  assistance, normal stance, moderate stride, good arm swing, smooth turning, no assistive device. Tandem gait is steady  DIAGNOSTIC DATA (LABS, IMAGING, TESTING) - I reviewed patient records, labs, notes, testing and imaging myself where available.  Lab Results  Component Value Date   WBC 6.8 03/28/2016   HGB 12.5 (L) 03/28/2016   HCT 38.0 (L) 03/28/2016   MCV 91.3 03/28/2016   PLT 307 03/28/2016      Component Value Date/Time   NA 138 03/28/2016 0527   NA 138 03/25/2016 1316   K 4.0 03/28/2016 0527   CL 106 03/28/2016 0527   CO2 22 03/28/2016 0527   GLUCOSE 96 03/28/2016 0527   BUN 12 03/28/2016 0527   BUN 14 03/25/2016 1316   CREATININE 1.12 03/28/2016 0527   CALCIUM 8.6 (L) 03/28/2016 0527   PROT 7.5 03/27/2016 0115   PROT 7.9 03/25/2016 1316   ALBUMIN 3.8 03/27/2016 0115   ALBUMIN 4.0 03/25/2016 1316   AST 25 03/27/2016 0115   ALT 19 03/27/2016 0115   ALKPHOS 66 03/27/2016 0115  BILITOT 0.5 03/27/2016 0115   BILITOT 0.2 03/25/2016 1316   GFRNONAA >60 03/28/2016 0527   GFRAA >60 03/28/2016 0527       ASSESSMENT AND PLAN  57 y.o. year old male  has a past medical history of Anxiety, Back pain, CKD (chronic kidney disease), stage II, Depression, Hypertension, Language barrier (03-01-13), Seizures ([place]), and TBI (traumatic brain injury) ([place]). here to follow up For his seizure disorder. He had 2 seizures last week.The patient is a current patient of Dr. Krista Blue  who is out of the office today . This note is sent to the work in doctor.      Will check labs, CBC, CMP and Dilantin level and Keppra level in am patient took am doses 1 hour ago RX for both Dilantin and Keppra  To be renewed when labs back F/U in 6 months Call for seizure activity Greater than 50% of time during this 15 minute visit was spent on counseling,explanation of diagnosis, planning of further management, discussion with patient and family through the interpreter of why  the labs cannot be done today ,  and coordination of care. His medications will refill. Refill once I get his labs back. I may need to adjust his medications. Dennie Bible, [facility], [facility], APRN  [facility] [address], [place], [place] [address] [phone]

## 2017-03-18 ENCOUNTER — Encounter (INDEPENDENT_AMBULATORY_CARE_PROVIDER_SITE_OTHER): Payer: Self-pay

## 2017-03-18 ENCOUNTER — Encounter: Payer: Self-pay | Admitting: Nurse Practitioner

## 2017-03-18 ENCOUNTER — Telehealth: Payer: Self-pay | Admitting: Nurse Practitioner

## 2017-03-18 ENCOUNTER — Ambulatory Visit: Payer: Medicare Other | Admitting: Nurse Practitioner

## 2017-03-18 ENCOUNTER — Ambulatory Visit (INDEPENDENT_AMBULATORY_CARE_PROVIDER_SITE_OTHER): Payer: Self-pay | Admitting: Nurse Practitioner

## 2017-03-18 VITALS — BP 125/79 | HR 74 | Wt 190.0 lb

## 2017-03-18 DIAGNOSIS — G40319 Generalized idiopathic epilepsy and epileptic syndromes, intractable, without status epilepticus: Secondary | ICD-10-CM

## 2017-03-18 NOTE — Telephone Encounter (Signed)
Lana/Language Resources [phone] returned Rn's call

## 2017-03-18 NOTE — Telephone Encounter (Signed)
This RN had called [place] to inform interpreter was no show. Spoke with Elane Fritz who stated there was no interpreter scheduled. THis RN stated per appt note there was to be interpreter. She stated would look into it and call back. This RN returned Lana's call. Elane Fritz stated the interpreter had a flat tire and said he called the office and told someone.  If this call came in before 8 am it went to answering service; Elane Fritz thought it probably did. That would explain why no interpreter was here. Clinton Gallant of upcoming appt date/time. She was going to call Y Keo to confirm he will be here to interpret that day/time.

## 2017-03-21 NOTE — Progress Notes (Signed)
[facility]  PATIENT: [name] DOB: 1960-07-20   REASON FOR VISIT: follow-up for generalized epilepsy HISTORY FROM: Interpreter agent patient does not speak English    HISTORY OF PRESENT ILLNESS: UPDATE 1/21/2019CM [name], 57 year old male returns for follow-up with his brother and an interpreter.  He has a brother or the patient speaks [place].  He has a seizure disorder and is currently on Keppra 1500mg  twice daily along with Dilantin  200 mg by mouth 2 times daily.  He has not had seizure event since last seen.  His last seizure event was in January 2018 and he was seen in the emergency room.  The brother denies that he is missed any doses of medication.  His medications are in a medicine planner.  Appetite is good and he is sleeping well.  No falls no balance issues.  He has had seizures in the past when missing medication.  He returns for reevaluation  03/24/16 CM Mr Watling, 57 year old male returns for followup. He does not speak [place]. He is accompanied by his 24/ 7 caregiver his brother and an interpreter. Neither brother or patient speak [place].. He has a seizure disorder and is currently on Dilantin 400 mg daily, Keppra 1500 mg twice daily. His last seizure activity was last week, he had 2 seizure-like events with loss of consciousness with each episode lasting a few minutes. The patient denies missing any doses of medication . The patient has had seizures in the past with noncompliance. Appetite is reportedly good, sleeps well at night. No falls, no balance issues.He returns for reevaluation .   HISTORY: He does not speak [place] and understands very little spoken [place]. He has a caregiver with him today who intreprets. By history he has mild cerebral palsy and a possible head injury in childhood. He has hx of gout and HTN. He has a 4th grade education and lives with his caregiver.  His brother says his seizures have been in good control. He continues to have  problems with short term memory and learning new information. He has no problems with day to day functioning. He is independent with ADL's. He had an admission to inpatient Behavorial health in January and June for uncontrolled anger.   He has been tapered off phenobarbital, he is not taking Dilantin 100 mg 2 tablets twice a day, levetiracetam 1000mg  1 and a half tablets twice a day. He continues to have seizures, the longest    REVIEW OF SYSTEMS: Full 14 system review of systems performed and notable only for those listed, all others are neg:  Constitutional: Fatigue  Cardiovascular: neg Ear/Nose/Throat: neg  Skin: neg Eyes: neg Respiratory: neg Gastroitestinal: neg  Hematology/Lymphatic: neg  Endocrine: neg Musculoskeletal: Walking difficulty Allergy/Immunology: neg Neurological: seizure disorder,  Psychiatric: neg Sleep : neg   ALLERGIES: No Known Allergies  HOME MEDICATIONS: Outpatient Medications Prior to Visit  Medication Sig Dispense Refill  . Cetirizine HCl 10 MG CAPS Take by mouth as needed.    [name] FLUoxetine (PROZAC) 20 MG capsule Take 20 mg by mouth daily.    . fluticasone (FLONASE) 50 MCG/ACT nasal spray Place into both nostrils daily.    [name] levETIRAcetam (KEPPRA) 1000 MG tablet Take 1,500 mg by mouth 2 (two) times daily.    [name] lisinopril (PRINIVIL,ZESTRIL) 10 MG tablet Take 10 mg by mouth daily.    . phenytoin (DILANTIN) 200 MG ER capsule Take 1 capsule (200 mg total) by mouth 2 (two) times daily. 60 capsule 2  No facility-administered medications prior to visit.     PAST MEDICAL HISTORY: Past Medical History:  Diagnosis Date  . Anxiety   . Back pain    low back pain/ compressed disk in lower back.   . CKD (chronic kidney disease), stage II   . Depression   . Hypertension   . Language barrier 03-01-13   Speaks very little English,primary-Vietnamese "[name] Dialect"  . Seizures ([place])    due head trauma-seizures are less, but occurs from mild to more  severe, which causes agitation to mental state.  [name] TBI (traumatic brain injury) ([place])    CARE FOR BY BROTHER    PAST SURGICAL HISTORY: Past Surgical History:  Procedure Laterality Date  . ABDOMINAL SURGERY    . COLONOSCOPY N/A 03/17/2013   Procedure: COLONOSCOPY;  Surgeon: [name], MD;  Location: [facility];  Service: Endoscopy;  Laterality: N/A;  . NO PAST SURGERIES     ? one surgery in [place]-? what, has abdominal scar.    FAMILY HISTORY: Family History  Problem Relation Age of Onset  . Seizures Mother        per his nonbiological brother    SOCIAL HISTORY: Social History   Socioeconomic History  . Marital status: Single    Spouse name: Not on file  . Number of children: Not on file  . Years of education: 4  . Highest education level: Not on file  Social Needs  . Financial resource strain: Not on file  . Food insecurity - worry: Not on file  . Food insecurity - inability: Not on file  . Transportation needs - medical: Not on file  . Transportation needs - non-medical: Not on file  Occupational History    Employer: UNEMPLOYED  Tobacco Use  . Smoking status: Former Smoker    Last attempt to quit: 03/02/1989    Years since quitting: 28.0  . Smokeless tobacco: Never Used  Substance and Sexual Activity  . Alcohol use: No  . Drug use: No  . Sexual activity: Not Currently  Other Topics Concern  . Not on file  Social History Narrative   ** Merged History Encounter **    Patient is single.   Patient is disabled.   Patient has a 4th grade education.   Patient drinks two cups of coffee daily.     PHYSICAL EXAM  Vitals:   03/22/17 0934  BP: 116/77  Pulse: 70  Weight: 187 lb 12.8 oz (85.2 kg)   Body mass index is 32.24 kg/m. Generalized: Well developed, in no acute distress  Head: normocephalic and atraumatic,. Oropharynx benign  Musculoskeletal: No deformity   Neurological examination  Mentation: Alert oriented to time, place, history taking.  Follows all commands , does not speak or understand [place].   Cranial nerve II-XII: Pupils were equal round reactive to light extraocular movements were full, visual field were full on confrontational test. Left exotropia. Facial sensation and strength were normal. hearing was intact to finger rubbing bilaterally. Uvula tongue midline. head turning and shoulder shrug were normal and symmetric.Tongue protrusion into cheek strength was normal. Motor: normal bulk and tone, full strength in the BUE, BLE,  Sensory: normal and symmetric to light touch,  withdraws to pain Coordination: finger-nose-finger, heel-to-shin bilaterally, no dysmetria Reflexes: 1+ upper lower and symmetric  Gait and Station: Rising up from seated position without assistance, normal stance, moderate stride, good arm swing, smooth turning, no assistive device. Tandem gait is mildly unsteady  DIAGNOSTIC DATA (LABS, IMAGING, TESTING) -  I reviewed patient records, labs, notes, testing and imaging myself where available.  Lab Results  Component Value Date   WBC 6.8 03/28/2016   HGB 12.5 (L) 03/28/2016   HCT 38.0 (L) 03/28/2016   MCV 91.3 03/28/2016   PLT 307 03/28/2016      Component Value Date/Time   NA 138 03/28/2016 0527   NA 138 03/25/2016 1316   K 4.0 03/28/2016 0527   CL 106 03/28/2016 0527   CO2 22 03/28/2016 0527   GLUCOSE 96 03/28/2016 0527   BUN 12 03/28/2016 0527   BUN 14 03/25/2016 1316   CREATININE 1.12 03/28/2016 0527   CALCIUM 8.6 (L) 03/28/2016 0527   PROT 7.5 03/27/2016 0115   PROT 7.9 03/25/2016 1316   ALBUMIN 3.8 03/27/2016 0115   ALBUMIN 4.0 03/25/2016 1316   AST 25 03/27/2016 0115   ALT 19 03/27/2016 0115   ALKPHOS 66 03/27/2016 0115   BILITOT 0.5 03/27/2016 0115   BILITOT 0.2 03/25/2016 1316   GFRNONAA >60 03/28/2016 0527   GFRAA >60 03/28/2016 0527       ASSESSMENT AND PLAN  57 y.o. year old male  has a past medical history of Anxiety, Back pain, CKD (chronic kidney disease),  stage II, Depression, Hypertension, Language barrier (03-01-13), Seizures ([place]), and TBI (traumatic brain injury) ([place]). here to follow up For his seizure disorder.  Last seizure was a year ago.  He is currently on Dilantin and Keppra without side effects      Will check labs, CBC, CMP to monitor adverse effects of Dilantin and Keppra  Dilantin level to monitor for therapeutic level toxicity Will renew medications F/U in 8 months Call for seizure activity Greater than 50% of time during this 15 minute visit was spent on counseling,explanation of diagnosis, planning of further management, discussion with patient and family through the interpreter of why  Labs are done.  Patient has already taken his meds this morning Dennie Bible, [facility], [facility], APRN  [facility] [address] [place], [place] [address] [phone]

## 2017-03-22 ENCOUNTER — Encounter: Payer: Self-pay | Admitting: Nurse Practitioner

## 2017-03-22 ENCOUNTER — Ambulatory Visit (INDEPENDENT_AMBULATORY_CARE_PROVIDER_SITE_OTHER): Payer: Medicare Other | Admitting: Nurse Practitioner

## 2017-03-22 VITALS — BP 116/77 | HR 70 | Wt 187.8 lb

## 2017-03-22 DIAGNOSIS — Z5181 Encounter for therapeutic drug level monitoring: Secondary | ICD-10-CM | POA: Diagnosis not present

## 2017-03-22 DIAGNOSIS — G40219 Localization-related (focal) (partial) symptomatic epilepsy and epileptic syndromes with complex partial seizures, intractable, without status epilepticus: Secondary | ICD-10-CM | POA: Diagnosis not present

## 2017-03-22 DIAGNOSIS — G40319 Generalized idiopathic epilepsy and epileptic syndromes, intractable, without status epilepticus: Secondary | ICD-10-CM

## 2017-03-22 MED ORDER — LEVETIRACETAM 1000 MG PO TABS: ORAL_TABLET | ORAL | 8 refills | Status: DC

## 2017-03-22 NOTE — Patient Instructions (Signed)
Will check labs, CBC, CMP and Dilantin level  Will renew medications F/U in 8 months Call for seizure activity

## 2017-03-22 NOTE — Progress Notes (Signed)
I have reviewed and agreed above plan. 

## 2017-03-23 ENCOUNTER — Telehealth: Payer: Self-pay | Admitting: *Deleted

## 2017-03-23 ENCOUNTER — Other Ambulatory Visit: Payer: Self-pay | Admitting: Nurse Practitioner

## 2017-03-23 LAB — CBC WITH DIFFERENTIAL/PLATELET
BASOS: 0 %
Basophils Absolute: 0 10*3/uL (ref 0.0–0.2)
EOS (ABSOLUTE): 0.4 10*3/uL (ref 0.0–0.4)
EOS: 6 %
HEMATOCRIT: 41.7 % (ref 37.5–51.0)
Hemoglobin: 14.6 g/dL (ref 13.0–17.7)
Immature Grans (Abs): 0 10*3/uL (ref 0.0–0.1)
Immature Granulocytes: 0 %
LYMPHS ABS: 1.5 10*3/uL (ref 0.7–3.1)
Lymphs: 24 %
MCH: 31.4 pg (ref 26.6–33.0)
MCHC: 35 g/dL (ref 31.5–35.7)
MCV: 90 fL (ref 79–97)
MONOS ABS: 0.7 10*3/uL (ref 0.1–0.9)
Monocytes: 10 %
Neutrophils Absolute: 3.9 10*3/uL (ref 1.4–7.0)
Neutrophils: 60 %
Platelets: 418 10*3/uL — ABNORMAL HIGH (ref 150–379)
RBC: 4.65 x10E6/uL (ref 4.14–5.80)
RDW: 14.7 % (ref 12.3–15.4)
WBC: 6.5 10*3/uL (ref 3.4–10.8)

## 2017-03-23 LAB — COMPREHENSIVE METABOLIC PANEL
A/G RATIO: 1.1 — AB (ref 1.2–2.2)
ALBUMIN: 4.4 g/dL (ref 3.5–5.5)
ALT: 17 IU/L (ref 0–44)
AST: 21 IU/L (ref 0–40)
Alkaline Phosphatase: 97 IU/L (ref 39–117)
BUN / CREAT RATIO: 12 (ref 9–20)
BUN: 13 mg/dL (ref 6–24)
Bilirubin Total: 0.3 mg/dL (ref 0.0–1.2)
CO2: 25 mmol/L (ref 20–29)
CREATININE: 1.1 mg/dL (ref 0.76–1.27)
Calcium: 9.3 mg/dL (ref 8.7–10.2)
Chloride: 101 mmol/L (ref 96–106)
GFR calc Af Amer: 85 mL/min/{1.73_m2} (ref >59)
GFR, EST NON AFRICAN AMERICAN: 74 mL/min/{1.73_m2} (ref >59)
GLOBULIN, TOTAL: 4 g/dL (ref 1.5–4.5)
Glucose: 90 mg/dL (ref 65–99)
POTASSIUM: 4 mmol/L (ref 3.5–5.2)
SODIUM: 144 mmol/L (ref 134–144)
Total Protein: 8.4 g/dL (ref 6.0–8.5)

## 2017-03-23 LAB — PHENYTOIN LEVEL, TOTAL: Phenytoin (Dilantin), Serum: 29.1 ug/mL (ref 10.0–20.0)

## 2017-03-23 MED ORDER — PHENYTOIN SODIUM EXTENDED 200 MG PO CAPS: 200.0000 mg | ORAL_CAPSULE | Freq: Two times a day (BID) | ORAL | 8 refills | Status: DC

## 2017-03-23 NOTE — Telephone Encounter (Signed)
Called lang resources to obtain interpreter to give patient lab results. Also advised [name] this RN needs to schedule interpreter for follow up. [name] stated she will set up phone conference to give results. This RN gave her two options for phone conference. She will call back with acceptable one.  For upcoming FU this RN will need to call Office of Inclusion [phone].  LVM for office of inclusion requesting call back tomorrow to schedule interpreter for Sept appt.

## 2017-03-24 NOTE — Telephone Encounter (Signed)
Lana @ Language resources as returned the call to RN [facility] she is asking for a returned call please

## 2017-03-24 NOTE — Telephone Encounter (Signed)
Received call back from Emeter[facility] Reeve interpreter services. [name] interpreter scheduled for 11/22/17 follow up in this office, duration of appt 1 1/3 hours.

## 2017-03-24 NOTE — Telephone Encounter (Signed)
Ethlyn Daniels, at [employer]. She stated a conference call has been set up with this RN, interpreter and patient tomorrow. This RN advised that it may not be possible to do a conference call with the phone at her desk. Lana suggested that this RN e mail message to her, and she will give message to interpreter. If patient has any questions, Elane Fritz will call this RN with questions.  This was approved by Edman Circle, Nursing Manager. E mail securely sent to [facility].

## 2017-04-05 DIAGNOSIS — J302 Other seasonal allergic rhinitis: Secondary | ICD-10-CM | POA: Diagnosis not present

## 2017-04-05 DIAGNOSIS — Z1211 Encounter for screening for malignant neoplasm of colon: Secondary | ICD-10-CM | POA: Diagnosis not present

## 2017-04-05 DIAGNOSIS — S91329A Laceration with foreign body, unspecified foot, initial encounter: Secondary | ICD-10-CM | POA: Diagnosis not present

## 2017-04-05 DIAGNOSIS — F329 Major depressive disorder, single episode, unspecified: Secondary | ICD-10-CM | POA: Diagnosis not present

## 2017-04-05 DIAGNOSIS — I10 Essential (primary) hypertension: Secondary | ICD-10-CM | POA: Diagnosis not present

## 2017-04-05 DIAGNOSIS — G40909 Epilepsy, unspecified, not intractable, without status epilepticus: Secondary | ICD-10-CM | POA: Diagnosis not present

## 2017-06-01 DIAGNOSIS — F329 Major depressive disorder, single episode, unspecified: Secondary | ICD-10-CM | POA: Diagnosis not present

## 2017-08-17 DIAGNOSIS — F329 Major depressive disorder, single episode, unspecified: Secondary | ICD-10-CM | POA: Diagnosis not present

## 2017-08-17 DIAGNOSIS — F439 Reaction to severe stress, unspecified: Secondary | ICD-10-CM | POA: Diagnosis not present

## 2017-10-04 DIAGNOSIS — G40909 Epilepsy, unspecified, not intractable, without status epilepticus: Secondary | ICD-10-CM | POA: Diagnosis not present

## 2017-10-04 DIAGNOSIS — Z13220 Encounter for screening for lipoid disorders: Secondary | ICD-10-CM | POA: Diagnosis not present

## 2017-10-04 DIAGNOSIS — Z125 Encounter for screening for malignant neoplasm of prostate: Secondary | ICD-10-CM | POA: Diagnosis not present

## 2017-10-04 DIAGNOSIS — Z131 Encounter for screening for diabetes mellitus: Secondary | ICD-10-CM | POA: Diagnosis not present

## 2017-10-04 DIAGNOSIS — Z0000 Encounter for general adult medical examination without abnormal findings: Secondary | ICD-10-CM | POA: Diagnosis not present

## 2017-10-04 DIAGNOSIS — I10 Essential (primary) hypertension: Secondary | ICD-10-CM | POA: Diagnosis not present

## 2017-10-04 DIAGNOSIS — L84 Corns and callosities: Secondary | ICD-10-CM | POA: Diagnosis not present

## 2017-11-05 ENCOUNTER — Ambulatory Visit (INDEPENDENT_AMBULATORY_CARE_PROVIDER_SITE_OTHER): Payer: Medicare Other | Admitting: Podiatry

## 2017-11-05 ENCOUNTER — Ambulatory Visit (INDEPENDENT_AMBULATORY_CARE_PROVIDER_SITE_OTHER): Payer: Medicare Other

## 2017-11-05 VITALS — BP 153/93 | HR 82

## 2017-11-05 DIAGNOSIS — Q828 Other specified congenital malformations of skin: Secondary | ICD-10-CM

## 2017-11-05 DIAGNOSIS — M778 Other enthesopathies, not elsewhere classified: Secondary | ICD-10-CM

## 2017-11-05 DIAGNOSIS — M7752 Other enthesopathy of left foot: Secondary | ICD-10-CM

## 2017-11-05 DIAGNOSIS — M779 Enthesopathy, unspecified: Secondary | ICD-10-CM

## 2017-11-05 DIAGNOSIS — S90852A Superficial foreign body, left foot, initial encounter: Secondary | ICD-10-CM

## 2017-11-05 NOTE — Progress Notes (Signed)
Subjective:  Patient ID: [name], male    DOB: 1960-09-13,  MRN: [id number]  Chief Complaint  Patient presents with  . Foot Pain    left foot, center of foot - small callus, has been there for years    57 y.o. male presents with the above complaint. Reports lesions to the bottom of the left foot for several years. Believes that [place] but unsure. Pain radiates to the 3rd toe. Denies prior treatments. Also complaints of a separate area where he may have stepped on something.  Patient presents with interpreter.  Review of Systems: Negative except as noted in the HPI. Denies N/V/F/Ch.  Past Medical History:  Diagnosis Date  . Anxiety   . Back pain    low back pain/ compressed disk in lower back.   . CKD (chronic kidney disease), stage II   . Depression   . Hypertension   . Language barrier 03-01-13   Speaks very little English,primary-Vietnamese "[name] Dialect"  . Seizures ([place])    due head trauma-seizures are less, but occurs from mild to more severe, which causes agitation to mental state.  [name] TBI (traumatic brain injury) ([place])    CARE FOR BY BROTHER    Current Outpatient Medications:  .  Cetirizine HCl 10 MG CAPS, Take by mouth as needed., Disp: , Rfl:  .  FLUoxetine (PROZAC) 20 MG capsule, Take 20 mg by mouth daily., Disp: , Rfl:  .  fluticasone (FLONASE) 50 MCG/ACT nasal spray, Place into both nostrils daily., Disp: , Rfl:  .  levETIRAcetam (KEPPRA) 1000 MG tablet, Take 1.5 tabs (1500mg ) twice daily, Disp: 90 tablet, Rfl: 8 .  lisinopril (PRINIVIL,ZESTRIL) 10 MG tablet, Take 10 mg by mouth daily., Disp: , Rfl:  .  phenytoin (DILANTIN) 200 MG ER capsule, Take 1 capsule (200 mg total) by mouth 2 (two) times daily., Disp: 60 capsule, Rfl: 8  Social History   Tobacco Use  Smoking Status Former Smoker  . Last attempt to quit: 03/02/1989  . Years since quitting: 28.6  Smokeless Tobacco Never Used    No Known Allergies Objective:    Vitals:   11/05/17 0937  BP: (!) 153/93  Pulse: 82   There is no height or weight on file to calculate BMI. Constitutional Well developed. Well nourished.  Vascular Dorsalis pedis pulses palpable bilaterally. Posterior tibial pulses palpable bilaterally. Capillary refill normal to all digits.  No cyanosis or clubbing noted. Pedal hair growth normal.  Neurologic Normal speech. Epicritic sensation to light touch grossly present bilaterally.  Dermatologic Nails well groomed and normal in appearance. Central plantar foot lesion with punctate core without evidence of retained foreign body. Separate lesion with retained splinter to the central forefoot.  Orthopedic: Normal joint ROM without pain or crepitus bilaterally. No visible deformities. No bony tenderness.   Radiographs: Cavus foot type. No acute fractures or dislocations. Metatarsal parabola normal. No retained foreign body. Assessment:   1. Capsulitis of foot, left   2. Porokeratosis   3. Splinter of left foot, initial encounter    Plan:  Patient was evaluated and treated and all questions answered.  Porokeratosis, Likely 2/2 old FB injury -Debrided with 312 blade  -Salinocaine applied to soften lesion. -Discussed possible removal if pain persists, however this could create painful scarring.  Splinter -Following sterile skin prep with alcohol, the area was debrided with a 312 blade. The splinter was removed in its entirety. The area was cleansed, and abx ointment and band-aid  applied.  Return if symptoms worsen or fail to improve.

## 2017-11-16 DIAGNOSIS — F439 Reaction to severe stress, unspecified: Secondary | ICD-10-CM | POA: Diagnosis not present

## 2017-11-16 DIAGNOSIS — F329 Major depressive disorder, single episode, unspecified: Secondary | ICD-10-CM | POA: Diagnosis not present

## 2017-11-19 NOTE — Progress Notes (Signed)
[facility]  PATIENT: [name] DOB: 12-Apr-1960   REASON FOR VISIT: follow-up for generalized epilepsy HISTORY FROM: Interpreter agent patient does not speak English    HISTORY OF PRESENT ILLNESS: UPDATE 9/23/2019CM [name], 57 year old male returns for follow-up with his interpreter.  He has a history of seizure disorder.  He reports that his last seizure was in July.  He went to his primary care Dr. Jeanie Cooks, who had placed him on Vimpat 2 years ago, pt is suppose to be taking 200mg  twice daily.  He denies missing any doses of his seizure medication, he is already on Dilantin 200 mg twice a day and Keppra 1500 mg twice daily.  He has not had further seizure activity since July.  He had labs that day also, we do not have a copy.  [facility].  Appetite is good and he is sleeping well.  No falls.  He returns for reevaluation H e has history of mild cerebral palsy and a possible head injury in childhood. He has hx of gout and HTN.   UPDATE 1/21/2019CM [name], 57 year old male returns for follow-up with his brother and an interpreter.  He has a brother or the patient speaks [place].  He has a seizure disorder and is currently on Keppra 1500mg  twice daily along with Dilantin  200 mg by mouth 2 times daily.  He has not had seizure event since last seen.  His last seizure event was in January 2018 and he was seen in the emergency room.  The brother denies that he is missed any doses of medication.  His medications are in a medicine planner.  Appetite is good and he is sleeping well.  No falls no balance issues.  He has had seizures in the past when missing medication.  He returns for reevaluation  03/24/16 CM Mr [name], 57 year old male returns for followup. He does not speak [place]. He is accompanied by his 24/ 7 caregiver his brother and an interpreter. Neither brother or patient speak [place].. He has a seizure disorder and is currently  on Dilantin 400 mg daily, Keppra 1500 mg twice daily. His last seizure activity was last week, he had 2 seizure-like events with loss of consciousness with each episode lasting a few minutes. The patient denies missing any doses of medication . The patient has had seizures in the past with noncompliance. Appetite is reportedly good, sleeps well at night. No falls, no balance issues.He returns for reevaluation .   HISTORY: He does not speak [place] and understands very little spoken [place]. He has a caregiver with him today who intreprets. By history he has mild cerebral palsy and a possible head injury in childhood. He has hx of gout and HTN. He has a 4th grade education and lives with his caregiver.  His brother says his seizures have been in good control. He continues to have problems with short term memory and learning new information. He has no problems with day to day functioning. He is independent with ADL's. He had an admission to inpatient [facility] in January and June for uncontrolled anger.   He has been tapered off phenobarbital, he is not taking Dilantin 100 mg 2 tablets twice a day, levetiracetam 1000mg  1 and a half tablets twice a day. He continues to have seizures, the longest    REVIEW OF SYSTEMS: Full 14 system review of systems performed and notable only for those listed, all others are  neg:  Constitutional: Fatigue  Cardiovascular: neg Ear/Nose/Throat: neg  Skin: neg Eyes: neg Respiratory: neg Gastroitestinal: neg  Hematology/Lymphatic: neg  Endocrine: neg Musculoskeletal: Walking difficulty Allergy/Immunology: neg Neurological: seizure disorder,  Psychiatric: neg Sleep : neg   ALLERGIES: No Known Allergies  HOME MEDICATIONS: Outpatient Medications Prior to Visit  Medication Sig Dispense Refill  . Cetirizine HCl 10 MG CAPS Take by mouth as needed.    [name] FLUoxetine (PROZAC) 20 MG capsule Take 20 mg by mouth daily.    . fluticasone (FLONASE) 50  MCG/ACT nasal spray Place into both nostrils daily.    [name] levETIRAcetam (KEPPRA) 1000 MG tablet Take 1.5 tabs (1500mg ) twice daily 90 tablet 8  . lisinopril (PRINIVIL,ZESTRIL) 10 MG tablet Take 10 mg by mouth daily.    . phenytoin (DILANTIN) 200 MG ER capsule Take 1 capsule (200 mg total) by mouth 2 (two) times daily. 60 capsule 8   No facility-administered medications prior to visit.     PAST MEDICAL HISTORY: Past Medical History:  Diagnosis Date  . Anxiety   . Back pain    low back pain/ compressed disk in lower back.   . CKD (chronic kidney disease), stage II   . Depression   . Hypertension   . Language barrier 03-01-13   Speaks very little English,primary-Vietnamese "[name] Dialect"  . Seizures ([place])    due head trauma-seizures are less, but occurs from mild to more severe, which causes agitation to mental state.  [name] TBI (traumatic brain injury) ([place])    CARE FOR BY BROTHER    PAST SURGICAL HISTORY: Past Surgical History:  Procedure Laterality Date  . ABDOMINAL SURGERY    . COLONOSCOPY N/A 03/17/2013   Procedure: COLONOSCOPY;  Surgeon: [name], MD;  Location: [facility];  Service: Endoscopy;  Laterality: N/A;  . NO PAST SURGERIES     ? one surgery in [place]-? what, has abdominal scar.    FAMILY HISTORY: Family History  Problem Relation Age of Onset  . Seizures Mother        per his nonbiological brother    SOCIAL HISTORY: Social History   Socioeconomic History  . Marital status: Single    Spouse name: Not on file  . Number of children: Not on file  . Years of education: 4  . Highest education level: Not on file  Occupational History    Employer: UNEMPLOYED  Social Needs  . Financial resource strain: Not on file  . Food insecurity:    Worry: Not on file    Inability: Not on file  . Transportation needs:    Medical: Not on file    Non-medical: Not on file  Tobacco Use  . Smoking status: Former Smoker    Last attempt to quit: 03/02/1989    Years  since quitting: 28.7  . Smokeless tobacco: Never Used  Substance and Sexual Activity  . Alcohol use: No  . Drug use: No  . Sexual activity: Not Currently  Lifestyle  . Physical activity:    Days per week: Not on file    Minutes per session: Not on file  . Stress: Not on file  Relationships  . Social connections:    Talks on phone: Not on file    Gets together: Not on file    Attends religious service: Not on file    Active member of club or organization: Not on file    Attends meetings of clubs or organizations: Not on file    Relationship status:  Not on file  . Intimate partner violence:    Fear of current or ex partner: Not on file    Emotionally abused: Not on file    Physically abused: Not on file    Forced sexual activity: Not on file  Other Topics Concern  . Not on file  Social History Narrative   Merged History Encounter     Patient is single.   Patient is disabled.   Patient has a 4th grade education.   Patient drinks two cups of coffee daily.     PHYSICAL EXAM  Vitals:   11/22/17 1342  BP: 115/81  Pulse: 86  Weight: 184 lb 6.4 oz (83.6 kg)  Height: 5\' 4"  (1.626 m)   Body mass index is 31.65 kg/m. Generalized: Well developed, in no acute distress  Head: normocephalic and atraumatic,. Oropharynx benign  Musculoskeletal: No deformity   Neurological examination  Mentation: Alert oriented to time, place, history taking. Follows all commands , does not speak or understand [place].   Cranial nerve II-XII: Pupils were equal round reactive to light extraocular movements were full, visual field were full on confrontational test. Left exotropia. Facial sensation and strength were normal. hearing was intact to finger rubbing bilaterally. Uvula tongue midline. head turning and shoulder shrug were normal and symmetric.Tongue protrusion into cheek strength was normal. Motor: normal bulk and tone, full strength in the BUE, BLE,  Sensory: normal and symmetric to  light touch, pinprick and vibratory , withdraws to pain Coordination: finger-nose-finger,  no dysmetria Reflexes: 1+ upper lower and symmetric  Gait and Station: Rising up from seated position without assistance, normal stance, moderate stride, good arm swing, smooth turning, no assistive device. Tandem gait is mildly unsteady  DIAGNOSTIC DATA (LABS, IMAGING, TESTING) - I reviewed patient records, labs, notes, testing and imaging myself where available.  Lab Results  Component Value Date   WBC 6.5 03/22/2017   HGB 14.6 03/22/2017   HCT 41.7 03/22/2017   MCV 90 03/22/2017   PLT 418 (H) 03/22/2017      Component Value Date/Time   NA 144 03/22/2017 1015   K 4.0 03/22/2017 1015   CL 101 03/22/2017 1015   CO2 25 03/22/2017 1015   GLUCOSE 90 03/22/2017 1015   GLUCOSE 96 03/28/2016 0527   BUN 13 03/22/2017 1015   CREATININE 1.10 03/22/2017 1015   CALCIUM 9.3 03/22/2017 1015   PROT 8.4 03/22/2017 1015   ALBUMIN 4.4 03/22/2017 1015   AST 21 03/22/2017 1015   ALT 17 03/22/2017 1015   ALKPHOS 97 03/22/2017 1015   BILITOT 0.3 03/22/2017 1015   GFRNONAA 74 03/22/2017 1015   GFRAA 85 03/22/2017 1015       ASSESSMENT AND PLAN  57 y.o. year old male  has a past medical history of Anxiety, Back pain, CKD (chronic kidney disease), stage II, Depression, Hypertension, Language barrier (03-01-13), Seizures ([place]), and TBI (traumatic brain injury) ([place]). here to follow up For his seizure disorder.  Last seizure was July 2019. He saw his PCP Dr. Jeanie Cooks who drew labs He had placed pt on  Vimpat 200mg   twice a day in 2017. [name]  He is also  on Dilantin and Keppra without side effects .  He denies any missed doses of medication   Will sign to get recent labs from Dr. Jeanie Cooks, CBC CMp Dilantin level, TSH, B12, Vit D, Lipid profile done on August 5th Continue  Dilantin at current dose Continue Keppra at current dose Continue  Vimpat 50mg   4 caps twice daily by Dr. Jeanie Cooks this is for seizure  F/U  in 6 months Call for seizure activity Greater than 50% of time during this 20 minute visit was spent on counseling,explanation of diagnosis, planning of further management, discussion with patient and family through the interpreter of why  Labs are done. No need to repeat just get copies.  Late note from pharmacy pt has not picked up Vimpat since 2017  Dennie Bible, [facility], [facility], [facility] [address] [place],  [address] [phone]

## 2017-11-22 ENCOUNTER — Encounter: Payer: Self-pay | Admitting: Nurse Practitioner

## 2017-11-22 ENCOUNTER — Ambulatory Visit (INDEPENDENT_AMBULATORY_CARE_PROVIDER_SITE_OTHER): Payer: Medicare Other | Admitting: Nurse Practitioner

## 2017-11-22 VITALS — BP 115/81 | HR 86 | Ht 64.0 in | Wt 184.4 lb

## 2017-11-22 DIAGNOSIS — Z9114 Patient's other noncompliance with medication regimen: Secondary | ICD-10-CM

## 2017-11-22 DIAGNOSIS — R569 Unspecified convulsions: Secondary | ICD-10-CM | POA: Diagnosis not present

## 2017-11-22 NOTE — Progress Notes (Signed)
I have reviewed and agreed above plan. 

## 2017-11-22 NOTE — Patient Instructions (Addendum)
Will sign to get recent labs from Dr. Jeanie Cooks, CBC CMp Dilantin level, TSH, B12, Vit D, Lipid profile  Continue  Dilantin at current dose Continue Keppra at current dose Placed on Vimpat 50mg   4 Twice daily by Dr. Jeanie Cooks this is seizure F/U in 6 months Call for seizure activity

## 2017-11-30 NOTE — Progress Notes (Signed)
Received last ofv note from [facility]. 10-04-17. Dr. Jeanie Cooks. sy

## 2018-02-07 ENCOUNTER — Encounter: Payer: Self-pay | Admitting: Nurse Practitioner

## 2018-02-09 DIAGNOSIS — F439 Reaction to severe stress, unspecified: Secondary | ICD-10-CM | POA: Diagnosis not present

## 2018-02-09 DIAGNOSIS — F329 Major depressive disorder, single episode, unspecified: Secondary | ICD-10-CM | POA: Diagnosis not present

## 2018-04-25 DIAGNOSIS — G40909 Epilepsy, unspecified, not intractable, without status epilepticus: Secondary | ICD-10-CM | POA: Diagnosis not present

## 2018-04-25 DIAGNOSIS — F329 Major depressive disorder, single episode, unspecified: Secondary | ICD-10-CM | POA: Diagnosis not present

## 2018-04-25 DIAGNOSIS — I10 Essential (primary) hypertension: Secondary | ICD-10-CM | POA: Diagnosis not present

## 2018-04-25 DIAGNOSIS — J302 Other seasonal allergic rhinitis: Secondary | ICD-10-CM | POA: Diagnosis not present

## 2018-05-05 DIAGNOSIS — F329 Major depressive disorder, single episode, unspecified: Secondary | ICD-10-CM | POA: Diagnosis not present

## 2018-05-23 ENCOUNTER — Ambulatory Visit: Payer: Medicare Other | Admitting: Nurse Practitioner

## 2018-06-08 ENCOUNTER — Telehealth: Payer: Self-pay | Admitting: *Deleted

## 2018-06-08 NOTE — Telephone Encounter (Addendum)
Spoke to brother of pt and vietnamese interpreter (was not able to get specific dialect with pacific interpreters).  Vu ID [id number],pacific interpreters was helpful.  I spoke to brother in broken [place].  Rescheduled appt to 08-08-18 at 1330. Relayed to call back if concerns/problems. He verbalized understanding.   Relayed initially trying to convert appt scheduled 06-13-18 at 1430 to telephone or video visit but felt like better to be seen in person.  Brother stated that pt on dilantin, vimpat, phenobarbital, not keppra.  Using [facility], [address], pcp Dr. [name].  Pt was doing ok. I relayed will call pharmacy about medications due to discrepancy.

## 2018-06-08 NOTE — Telephone Encounter (Signed)
I called pharmacy, [facility], and they have keppra 1000mg  90 day supply 06-07-18 1.5mg  po bid, and dilantin 200mg  po bid 90 day supply 05-24-18.  No vimpat recorded.  (this was given to him by pcp). Pt is not taking vimpat, phenobarbital).

## 2018-06-09 NOTE — Telephone Encounter (Signed)
Attempted to call pcp, closed at this time.  Will try later.

## 2018-06-09 NOTE — Telephone Encounter (Signed)
I called and spoke to [facility], with Dr. Jeanie Cooks office.  Pt is taking levetiracetam 1500mg  po bid, and dilantin ER 200mg  po bid.  Noted lacosamide but had not taken for 3 yrs.  He is not taking this as not filled at pharmacy either.

## 2018-06-13 ENCOUNTER — Ambulatory Visit: Payer: Medicare Other | Admitting: Family Medicine

## 2018-07-20 DIAGNOSIS — F329 Major depressive disorder, single episode, unspecified: Secondary | ICD-10-CM | POA: Diagnosis not present

## 2018-07-20 DIAGNOSIS — F439 Reaction to severe stress, unspecified: Secondary | ICD-10-CM | POA: Diagnosis not present

## 2018-08-01 ENCOUNTER — Telehealth: Payer: Self-pay

## 2018-08-01 NOTE — Telephone Encounter (Signed)
Spoke with the patient's brother and he has given verbal consent to do a telephone visit and to file his insurance. Mobile number has been confirmed.   [phone]

## 2018-08-08 ENCOUNTER — Other Ambulatory Visit: Payer: Self-pay

## 2018-08-08 ENCOUNTER — Ambulatory Visit (INDEPENDENT_AMBULATORY_CARE_PROVIDER_SITE_OTHER): Payer: Medicare Other | Admitting: Family Medicine

## 2018-08-08 DIAGNOSIS — R569 Unspecified convulsions: Secondary | ICD-10-CM

## 2018-08-08 MED ORDER — PHENYTOIN SODIUM EXTENDED 200 MG PO CAPS: 200.0000 mg | ORAL_CAPSULE | Freq: Two times a day (BID) | ORAL | 3 refills | Status: DC

## 2018-08-08 MED ORDER — LEVETIRACETAM 1000 MG PO TABS: ORAL_TABLET | ORAL | 3 refills | Status: DC

## 2018-08-08 NOTE — Progress Notes (Signed)
   PATIENT: [name] DOB: [birth year]  REASON FOR VISIT: follow up HISTORY FROM: patient  Virtual Visit via Telephone Note  I connected with [name] on 08/08/18 at  1:30 PM EDT by telephone and verified that I am speaking with the correct person using two identifiers.   I discussed the limitations, risks, security and privacy concerns of performing an evaluation and management service by telephone and the availability of in person appointments. I also discussed with the patient that there may be a patient responsible charge related to this service. The patient expressed understanding and agreed to proceed.   History of Present Illness:  08/08/18 [name] is a 58 y.o. male here today for follow up of seizure.  His brother was used to interpret per patient request.  [name] is doing well with current treatment of levetiracetam 1500mg  BID and phenytoin 200mg  BID.  He denies any recent seizure activity.  Last seizure was in July 2019.  He does not drive.  He is able to dress and bathe himself.  He has no concerns today.   Observations/Objective:  Generalized: Well developed, in no acute distress  Mentation: Alert oriented to time, place, history taking. Follows all commands speech and language fluent   Assessment and Plan:  58 y.o.  year old male  has a past medical history of Anxiety, Back pain, CKD (chronic kidney disease), stage II, Depression, Hypertension, Language barrier (03-01-13), Seizures ([place]), and TBI (traumatic brain injury) ([place]). here with    ICD-10-CM   1. Seizure ([place]) R56.9 phenytoin (DILANTIN) 200 MG ER capsule    levETIRAcetam (KEPPRA) 1000 MG tablet   He is doing well on current therapy.  We will continue Keppra 1500 mg twice daily and Dilantin 200 mg twice daily.  He was advised to call with any concerns of seizure activity.  We will follow-up in the office in 6 months.  Patient's brother verbalizes understanding and agreement with this plan.  No orders of the  defined types were placed in this encounter.   Meds ordered this encounter  Medications  . phenytoin (DILANTIN) 200 MG ER capsule    Sig: Take 1 capsule (200 mg total) by mouth 2 (two) times daily.    Dispense:  180 capsule    Refill:  3    Please have pt schedule FU.    Order Specific Question:   Supervising Provider    Answer:   Melvenia Beam [id number]  . levETIRAcetam (KEPPRA) 1000 MG tablet    Sig: Take 1.5 tabs (1500mg ) twice daily    Dispense:  270 tablet    Refill:  3    Order Specific Question:   Supervising Provider    Answer:   Melvenia Beam [[id number]]     Follow Up Instructions:  I discussed the assessment and treatment plan with the patient. The patient was provided an opportunity to ask questions and all were answered. The patient agreed with the plan and demonstrated an understanding of the instructions.   The patient was advised to call back or seek an in-person evaluation if the symptoms worsen or if the condition fails to improve as anticipated.  I provided 25 minutes of non-face-to-face time during this encounter.  Patient and patient's brother located at their place of residence during teleconference.  Provider located at her place of residence.  Maryelizabeth Kaufmann, CMA helped to facilitate visit.   [name], NP

## 2018-08-08 NOTE — Progress Notes (Signed)
I have reviewed and agreed above plan. 

## 2018-08-10 NOTE — Progress Notes (Signed)
I have reviewed and agreed above plan. 

## 2018-09-30 ENCOUNTER — Emergency Department (HOSPITAL_COMMUNITY): Payer: Medicare Other

## 2018-09-30 ENCOUNTER — Other Ambulatory Visit: Payer: Self-pay

## 2018-09-30 ENCOUNTER — Inpatient Hospital Stay (HOSPITAL_COMMUNITY)
Admission: EM | Admit: 2018-09-30 | Discharge: 2018-10-05 | DRG: 101 | Disposition: A | Discharge status: Home Health | Payer: Medicare Other | Attending: Family Medicine | Admitting: Family Medicine

## 2018-09-30 ENCOUNTER — Encounter (HOSPITAL_COMMUNITY): Payer: Self-pay | Admitting: Emergency Medicine

## 2018-09-30 DIAGNOSIS — N182 Chronic kidney disease, stage 2 (mild): Secondary | ICD-10-CM | POA: Diagnosis present

## 2018-09-30 DIAGNOSIS — I129 Hypertensive chronic kidney disease with stage 1 through stage 4 chronic kidney disease, or unspecified chronic kidney disease: Secondary | ICD-10-CM | POA: Diagnosis not present

## 2018-09-30 DIAGNOSIS — R7889 Finding of other specified substances, not normally found in blood: Secondary | ICD-10-CM | POA: Diagnosis not present

## 2018-09-30 DIAGNOSIS — N179 Acute kidney failure, unspecified: Secondary | ICD-10-CM | POA: Diagnosis not present

## 2018-09-30 DIAGNOSIS — I10 Essential (primary) hypertension: Secondary | ICD-10-CM | POA: Diagnosis not present

## 2018-09-30 DIAGNOSIS — R471 Dysarthria and anarthria: Secondary | ICD-10-CM | POA: Diagnosis present

## 2018-09-30 DIAGNOSIS — D72829 Elevated white blood cell count, unspecified: Secondary | ICD-10-CM

## 2018-09-30 DIAGNOSIS — Z20828 Contact with and (suspected) exposure to other viral communicable diseases: Secondary | ICD-10-CM | POA: Diagnosis present

## 2018-09-30 DIAGNOSIS — R404 Transient alteration of awareness: Secondary | ICD-10-CM | POA: Diagnosis not present

## 2018-09-30 DIAGNOSIS — E876 Hypokalemia: Secondary | ICD-10-CM

## 2018-09-30 DIAGNOSIS — Z03818 Encounter for observation for suspected exposure to other biological agents ruled out: Secondary | ICD-10-CM | POA: Diagnosis not present

## 2018-09-30 DIAGNOSIS — Z87820 Personal history of traumatic brain injury: Secondary | ICD-10-CM

## 2018-09-30 DIAGNOSIS — R569 Unspecified convulsions: Secondary | ICD-10-CM | POA: Diagnosis not present

## 2018-09-30 DIAGNOSIS — E871 Hypo-osmolality and hyponatremia: Secondary | ICD-10-CM | POA: Diagnosis present

## 2018-09-30 DIAGNOSIS — G40419 Other generalized epilepsy and epileptic syndromes, intractable, without status epilepticus: Secondary | ICD-10-CM | POA: Diagnosis not present

## 2018-09-30 DIAGNOSIS — F329 Major depressive disorder, single episode, unspecified: Secondary | ICD-10-CM | POA: Diagnosis present

## 2018-09-30 DIAGNOSIS — G40901 Epilepsy, unspecified, not intractable, with status epilepticus: Secondary | ICD-10-CM | POA: Diagnosis not present

## 2018-09-30 DIAGNOSIS — G9340 Encephalopathy, unspecified: Secondary | ICD-10-CM | POA: Diagnosis present

## 2018-09-30 DIAGNOSIS — R000 Tachycardia, unspecified: Secondary | ICD-10-CM | POA: Diagnosis not present

## 2018-09-30 DIAGNOSIS — Z87891 Personal history of nicotine dependence: Secondary | ICD-10-CM

## 2018-09-30 DIAGNOSIS — T420X5A Adverse effect of hydantoin derivatives, initial encounter: Secondary | ICD-10-CM | POA: Diagnosis present

## 2018-09-30 DIAGNOSIS — Z7951 Long term (current) use of inhaled steroids: Secondary | ICD-10-CM

## 2018-09-30 DIAGNOSIS — R0902 Hypoxemia: Secondary | ICD-10-CM | POA: Diagnosis not present

## 2018-09-30 DIAGNOSIS — G40909 Epilepsy, unspecified, not intractable, without status epilepticus: Secondary | ICD-10-CM | POA: Diagnosis not present

## 2018-09-30 LAB — CBC
HCT: 39.5 % (ref 39.0–52.0)
Hemoglobin: 13.5 g/dL (ref 13.0–17.0)
MCH: 31.8 pg (ref 26.0–34.0)
MCHC: 34.2 g/dL (ref 30.0–36.0)
MCV: 93.2 fL (ref 80.0–100.0)
Platelets: 343 10*3/uL (ref 150–400)
RBC: 4.24 MIL/uL (ref 4.22–5.81)
RDW: 12.9 % (ref 11.5–15.5)
WBC: 11.6 10*3/uL — ABNORMAL HIGH (ref 4.0–10.5)
nRBC: 0 % (ref 0.0–0.2)

## 2018-09-30 LAB — COMPREHENSIVE METABOLIC PANEL
ALT: 15 U/L (ref 0–44)
AST: 24 U/L (ref 15–41)
Albumin: 3.8 g/dL (ref 3.5–5.0)
Alkaline Phosphatase: 77 U/L (ref 38–126)
Anion gap: 12 (ref 5–15)
BUN: 7 mg/dL (ref 6–20)
CO2: 22 mmol/L (ref 22–32)
Calcium: 8.4 mg/dL — ABNORMAL LOW (ref 8.9–10.3)
Chloride: 100 mmol/L (ref 98–111)
Creatinine, Ser: 1.43 mg/dL — ABNORMAL HIGH (ref 0.61–1.24)
GFR calc Af Amer: >60 mL/min (ref >60)
GFR calc non Af Amer: 53 mL/min — ABNORMAL LOW (ref >60)
Glucose, Bld: 106 mg/dL — ABNORMAL HIGH (ref 70–99)
Potassium: 3.3 mmol/L — ABNORMAL LOW (ref 3.5–5.1)
Sodium: 134 mmol/L — ABNORMAL LOW (ref 135–145)
Total Bilirubin: 0.5 mg/dL (ref 0.3–1.2)
Total Protein: 7.7 g/dL (ref 6.5–8.1)

## 2018-09-30 LAB — URINALYSIS, ROUTINE W REFLEX MICROSCOPIC
Bilirubin Urine: NEGATIVE
Glucose, UA: NEGATIVE mg/dL
Ketones, ur: NEGATIVE mg/dL
Leukocytes,Ua: NEGATIVE
Nitrite: NEGATIVE
Protein, ur: 30 mg/dL — AB
Specific Gravity, Urine: 1.012 (ref 1.005–1.030)
pH: 5 (ref 5.0–8.0)

## 2018-09-30 LAB — RAPID URINE DRUG SCREEN, HOSP PERFORMED
Amphetamines: NOT DETECTED
Barbiturates: NOT DETECTED
Benzodiazepines: POSITIVE — AB
Cocaine: NOT DETECTED
Opiates: NOT DETECTED
Tetrahydrocannabinol: NOT DETECTED

## 2018-09-30 LAB — PHENYTOIN LEVEL, TOTAL: Phenytoin Lvl: 29 ug/mL — ABNORMAL HIGH (ref 10.0–20.0)

## 2018-09-30 LAB — CBG MONITORING, ED: Glucose-Capillary: 103 mg/dL — ABNORMAL HIGH (ref 70–99)

## 2018-09-30 LAB — SARS CORONAVIRUS 2 BY RT PCR (HOSPITAL ORDER, PERFORMED IN ~~LOC~~ HOSPITAL LAB): SARS Coronavirus 2: NEGATIVE

## 2018-09-30 MED ORDER — SODIUM CHLORIDE 0.9 % IV SOLN
INTRAVENOUS | Status: DC
  Administered 2018-09-30: 23:00:00 via INTRAVENOUS

## 2018-09-30 MED ORDER — LEVETIRACETAM IN NACL 1000 MG/100ML IV SOLN
1000.0000 mg | Freq: Once | INTRAVENOUS | Status: AC
  Administered 2018-09-30: 1000 mg via INTRAVENOUS
  Filled 2018-09-30: qty 100

## 2018-09-30 NOTE — ED Notes (Signed)
Pt taken off Non-rebreather mask put on nasal cannula 4L, SPO2 99%. Seizure pads placed on bed.

## 2018-09-30 NOTE — H&P (Addendum)
Red Quant MGQ:[id number] DOB: Feb 11, 1961 DOA: 09/30/2018     PCP: [name], MD   Outpatient Specialists:    NEurology    Dr. Krista Blue    Patient arrived to ER on 09/30/18 at [facility]  Patient coming from: home Lives  With family    Chief Complaint:  Chief Complaint  Patient presents with  . Seizures    HPI: [name] is a 58 y.o. male with medical history significant of seizure disorder currently being followed by neurology, CKD, depression, hypertension, TBI    Presented with multiple seizures today EMS was called he continues to have seizure for 20 -30 minutes was given 5 mg of Versed IM and  5 mg IO by EMS On arrival to emergency department continued to have rapid eye movements and muscle twitching unable to answer any questions left eye deviated to the left EMS obtained history the patient has not been compliant his medications. CBG was checked was 90  no known trauma falls or fevers.  He has known history of seizures for which he is on Keppra 1500 twice daily and phenytoin 200 mg twice daily his last seizure was in July 2019 he does not drive due to his family   Infectious risk factors:  Reports none In  ER RAPID COVID TEST NEGATIVE     Regarding pertinent Chronic problems:    History of TBI patient has been taking care of by her family at home has chronic use history of seizures   HTN on lisinopril    CKD stage II  - baseline Cr 1.1   While in ER: Dilatin level up to 29  The following Work up has been ordered so far:  Orders Placed This Encounter  Procedures  . SARS Coronavirus 2 [facility] order, Performed in [facility] hospital lab) Nasopharyngeal Nasopharyngeal Swab  . CT HEAD WO CONTRAST  . DG CHEST PORT 1 VIEW  . CBC  . Comprehensive metabolic panel  . Urinalysis, Routine w reflex microscopic  . Rapid urine drug screen (hospital performed)  . Phenytoin level, total  . Levetiracetam level  . Cardiac monitoring  . Consult to neurology  ALL  PATIENTS BEING ADMITTED/HAVING PROCEDURES NEED COVID-19 SCREENING  . Consult to hospitalist  ALL PATIENTS BEING ADMITTED/HAVING PROCEDURES NEED COVID-19 SCREENING  . POC CBG, ED  . EKG 12-Lead  . Place in observation (patient's expected length of stay will be less than 2 midnights)     Following Medications were ordered in ER: Medications  0.9 %  sodium chloride infusion ( Intravenous New Bag/Given 09/30/18 2235)  levETIRAcetam (KEPPRA) IVPB 1000 mg/100 mL premix (0 mg Intravenous Stopped 09/30/18 2324)        Consult Orders  (From admission, onward)         Start     Ordered   09/30/18 2223  Consult to hospitalist  ALL PATIENTS BEING ADMITTED/HAVING PROCEDURES NEED COVID-19 SCREENING  Once    Comments: ALL PATIENTS BEING ADMITTED/HAVING PROCEDURES NEED COVID-19 SCREENING  Provider:  (Not yet assigned)  Question Answer Comment  Place call to: [facility]   Reason for Consult Admit      09/30/18 2222          ER Provider Called:  Neurology    Dr. Rory Percy They Recommend admit to medicine Keppra 1000 mg IV load Will see  n ER   Significant initial  Findings: Abnormal Labs Reviewed  CBC - Abnormal; Notable for the following components:  Result Value   WBC 11.6 (*)    All other components within normal limits  COMPREHENSIVE METABOLIC PANEL - Abnormal; Notable for the following components:   Sodium 134 (*)    Potassium 3.3 (*)    Glucose, Bld 106 (*)    Creatinine, Ser 1.43 (*)    Calcium 8.4 (*)    GFR calc non Af Amer 53 (*)    All other components within normal limits  URINALYSIS, ROUTINE W REFLEX MICROSCOPIC - Abnormal; Notable for the following components:   APPearance HAZY (*)    Hgb urine dipstick SMALL (*)    Protein, ur 30 (*)    Bacteria, UA RARE (*)    All other components within normal limits  RAPID URINE DRUG SCREEN, HOSP PERFORMED - Abnormal; Notable for the following components:   Benzodiazepines POSITIVE (*)    All other components within  normal limits  PHENYTOIN LEVEL, TOTAL - Abnormal; Notable for the following components:   Phenytoin Lvl 29.0 (*)    All other components within normal limits  CBG MONITORING, ED - Abnormal; Notable for the following components:   Glucose-Capillary 103 (*)    All other components within normal limits    Otherwise labs showing:    Recent Labs  Lab 09/30/18 2002  NA 134*  K 3.3*  CO2 22  GLUCOSE 106*  BUN 7  CREATININE 1.43*  CALCIUM 8.4*    Cr    Up from baseline see below Lab Results  Component Value Date   CREATININE 1.43 (H) 09/30/2018   CREATININE 1.10 03/22/2017   CREATININE 1.12 03/28/2016    Recent Labs  Lab 09/30/18 2002  AST 24  ALT 15  ALKPHOS 77  BILITOT 0.5  PROT 7.7  ALBUMIN 3.8   Lab Results  Component Value Date   CALCIUM 8.4 (L) 09/30/2018     WBC      Component Value Date/Time   WBC 11.6 (H) 09/30/2018 2002   ANC    Component Value Date/Time   NEUTROABS 3.9 03/22/2017 1015   ALC No results found for: LYMPHOABS    Plt: Lab Results  Component Value Date   PLT 343 09/30/2018    Lactic Acid, Venous    Component Value Date/Time   LATICACIDVEN 2.4 (HH) 07/03/2015 0937       COVID-19 Labs  No results for input(s): DDIMER, FERRITIN, LDH, CRP in the last 72 hours.  Lab Results  Component Value Date   SARSCOV2NAA NEGATIVE 09/30/2018       ABG    Component Value Date/Time   TCO2 27 10/27/2014 1313      HG/HCT  Stable,     Component Value Date/Time   HGB 13.5 09/30/2018 2002   HGB 14.6 03/22/2017 1015   HCT 39.5 09/30/2018 2002   HCT 41.7 03/22/2017 1015     CBG (last 3)  Recent Labs    09/30/18 2007  GLUCAP 103*       UA no UTI   Urine analysis:    Component Value Date/Time   COLORURINE YELLOW 09/30/2018 2002   APPEARANCEUR HAZY (A) 09/30/2018 2002   LABSPEC 1.012 09/30/2018 2002   PHURINE 5.0 09/30/2018 2002   GLUCOSEU NEGATIVE 09/30/2018 2002   HGBUR SMALL (A) 09/30/2018 2002   BILIRUBINUR  NEGATIVE 09/30/2018 2002   KETONESUR NEGATIVE 09/30/2018 2002   PROTEINUR 30 (A) 09/30/2018 2002   UROBILINOGEN 0.2 08/04/2012 1429   NITRITE NEGATIVE 09/30/2018 2002   LEUKOCYTESUR NEGATIVE 09/30/2018 2002  CT HEAD   NON acute  CXR - NON acute    ECG:  Personally reviewed by me showing: HR : 110  Rhythm:   Sinus tachycardia    no evidence of ischemic changes QTC 478      ED Triage Vitals [09/30/18 2001]  Enc Vitals Group     BP 134/86     Pulse Rate (!) 112     Resp (!) 23     Temp 97.9 F (36.6 C)     Temp Source Temporal     SpO2 100 %     Weight      Height      Head Circumference      Peak Flow      Pain Score      Pain Loc      Pain Edu?      Excl. in [place]?   HQIO(96)@       Latest  Blood pressure (!) 146/94, pulse (!) 106, temperature 97.9 F (36.6 C), temperature source Temporal, resp. rate 16, SpO2 100 %.    Hospitalist was called for admission for prolonged seizure   Review of Systems:    Pertinent positives include:  Unable to obtain   Past Medical History:   Past Medical History:  Diagnosis Date  . Anxiety   . Back pain    low back pain/ compressed disk in lower back.   . CKD (chronic kidney disease), stage II   . Depression   . Hypertension   . Language barrier 03-01-13   Speaks very little English,primary-Vietnamese "[name] Dialect"  . Seizures ([place])    due head trauma-seizures are less, but occurs from mild to more severe, which causes agitation to mental state.  [name] TBI (traumatic brain injury) ([place])    CARE FOR BY BROTHER      Past Surgical History:  Procedure Laterality Date  . ABDOMINAL SURGERY    . COLONOSCOPY N/A 03/17/2013   Procedure: COLONOSCOPY;  Surgeon: [name], MD;  Location: [facility] ENDOSCOPY;  Service: Endoscopy;  Laterality: N/A;  . NO PAST SURGERIES     ? one surgery in [place]-? what, has abdominal scar.    Social History:  Ambulatory  independently      reports that he quit smoking about 29 years  ago. He has never used smokeless tobacco. He reports that he does not drink alcohol or use drugs.     Family History:   Family History  Problem Relation Age of Onset  . Seizures Mother        per his nonbiological brother    Allergies: No Known Allergies   Prior to Admission medications   Medication Sig Start Date End Date Taking? Authorizing Provider  Cetirizine HCl 10 MG CAPS Take by mouth as needed.    [provider]  FLUoxetine (PROZAC) 20 MG capsule Take 20 mg by mouth daily.    [provider]  fluticasone (FLONASE) 50 MCG/ACT nasal spray Place into both nostrils daily.    [provider]  levETIRAcetam (KEPPRA) 1000 MG tablet Take 1.5 tabs (1500mg ) twice daily 08/08/18   Lomax, Amy, NP  lisinopril (PRINIVIL,ZESTRIL) 10 MG tablet Take 10 mg by mouth daily.    [provider]  phenytoin (DILANTIN) 200 MG ER capsule Take 1 capsule (200 mg total) by mouth 2 (two) times daily. 08/08/18   Lomax, Amy, NP   Physical Exam: Blood pressure (!) 146/94, pulse (!) 106, temperature 97.9 F (36.6 C), temperature  source Temporal, resp. rate 16, SpO2 100 %. 1. General:  in No  Acute distress   Chronically ill -appearing 2. Psychological: Alert and Oriented to self 3. Head/ENT:     Dry Mucous Membranes                          Head Non traumatic, neck supple                          Poor Dentition 4. SKIN:   decreased Skin turgor,  Skin clean Dry and intact no rash 5. Heart: Regular rate and rhythm no  Murmur, no Rub or gallop 6. Lungs:   no wheezes or crackles   7. Abdomen: Soft,   non-tender, Non distended  obese   8. Lower extremities: no clubbing, cyanosis, no  edema 9. Neurologically Grossly intact, moving all 4 extremities equally    10. MSK: Normal range of motion   All other LABS:     Recent Labs  Lab 09/30/18 2002  WBC 11.6*  HGB 13.5  HCT 39.5  MCV 93.2  PLT 343     Recent Labs  Lab 09/30/18 2002  NA 134*  K 3.3*  CL 100  CO2  22  GLUCOSE 106*  BUN 7  CREATININE 1.43*  CALCIUM 8.4*     Recent Labs  Lab 09/30/18 2002  AST 24  ALT 15  ALKPHOS 77  BILITOT 0.5  PROT 7.7  ALBUMIN 3.8       Cultures:    Component Value Date/Time   SDES URINE, RANDOM 07/03/2015 0647   SPECREQUEST NONE 07/03/2015 0647   CULT NO GROWTH 1 DAY 07/03/2015 0647   REPTSTATUS 07/04/2015 FINAL 07/03/2015 0647     Radiological Exams on Admission: Ct Head Wo Contrast  Result Date: 09/30/2018 CLINICAL DATA:  Seizure activity EXAM: CT HEAD WITHOUT CONTRAST TECHNIQUE: Contiguous axial images were obtained from the base of the skull through the vertex without intravenous contrast. COMPARISON:  March 27, 2016. FINDINGS: Brain: No evidence of acute infarction, hemorrhage, hydrocephalus, extra-axial collection or mass lesion/mass effect. Chronic microvascular ischemic changes and an old left basal ganglia lacunar infarct are again noted. The posterior fossa is poorly evaluated secondary to extensive streak artifact from a metallic structure along the patient's right lateral aspect. Examination was limited by motion artifact. Vascular: No hyperdense vessel or unexpected calcification. Skull: Normal. Negative for fracture or focal lesion. Sinuses/Orbits: There is mild mucosal thickening of the bilateral maxillary sinuses and ethmoid air cells, otherwise the remaining paranasal sinuses and mastoid air cells are essentially clear. Other: None. IMPRESSION: 1. Study degraded by motion artifact and streak artifact as detailed above. 2. Given these limitations, there is no acute intracranial abnormality detected. Electronically Signed   By: Constance Holster M.D.   On: 09/30/2018 22:01   Dg Chest Port 1 View  Result Date: 09/30/2018 CLINICAL DATA:  Multiple seizures EXAM: PORTABLE CHEST 1 VIEW COMPARISON:  Jul 03, 2015 FINDINGS: The patient is slightly rotated, however the cardiomediastinal silhouette appears to be unremarkable. Large airspace  consolidation or pleural effusion. No acute osseous abnormality. IMPRESSION: No acute cardiopulmonary process. Electronically Signed   By: Prudencio Pair M.D.   On: 09/30/2018 23:04    Chart has been reviewed    Assessment/Plan  58 y.o. male with medical history significant of seizure disorder currently being followed by neurology, CKD, depression, hypertension, TBI Admitted for long seizure unclear  etiology possibly noncompliant with home medications  Present on Admission: . Prolonged seizure [facility]) appreciate neurology input.  Given elevated Dilantin level per neurology will hold Dilantin for tonight and continue with Keppra patient was loaded with Keppra IV in emergency department continue with Keppra IV unless able to tolerate p.o.  Order EEG seizure precautions.  . Encephalopathy -prolonged postictal state unsure what patient's baseline is.  No family at bedside patient is non-English speaking.  Appears to be comfortable at this time with purposeful movements. [name] HTN (hypertension) stable resume home medications when able to tolerate them and passes swallow eval . CKD (chronic kidney disease), stage II chronic stable avoid nephrotoxic medications  Evaded leukocytes likely secondary to seizure no evidence of infectious process at this time  Other plan as per orders.  DVT prophylaxis:  SCD      Code Status:  FULL CODE   Family Communication:   Family not at  Bedside    Disposition Plan:     To home once workup is complete and patient is stable                     Would benefit from PT/OT eval prior to DC  Ordered                   Swallow eval - SLP ordered                                        Consults called: Neurology     Admission status:  ED Disposition    ED Disposition Condition [place]: [place] [100100]  Level of Care: Progressive [102]  I expect the patient will be discharged within 24 hours: No (not a candidate for  5C-Observation unit)  Covid Evaluation: Confirmed COVID Negative  Diagnosis: Prolonged seizure ([place]) [893734]  Admitting Physician: Toy Baker [[id number]]  Attending Physician: Toy Baker [[id number]]  PT Class (Do Not Modify): Observation [104]  PT Acc Code (Do Not Modify): Observation [10022]        Obs    Level of care          SDU tele indefinitely please discontinue once patient no longer qualifies  Precautions:  NONE  No active isolations  PPE: Used by the provider:   P100  eye Goggles,  Gloves     Kasson Lamere 10/01/2018, 12:01 AM    [facility]     after 2 AM please page floor coverage PA If 7AM-7PM, please contact the day team taking care of the patient using [email or web address]

## 2018-09-30 NOTE — ED Triage Notes (Signed)
Patient with multiple seizures today.  EMS picked him up from home.  He had one continuous seizure for 20 mins, was given 5mg  Versed IM and then 5mg  IO by EMS.  Patient arrives with rapid eye movement and muscle tics.  Patient unable to answer RN upon arrival.  Left eye gaze to the left.  Patient has had this in the past, noncompliant with meds per EMS and family.

## 2018-09-30 NOTE — ED Provider Notes (Signed)
[facility] DEPARTMENT Provider Note   CSN: [id number] Arrival date & time: 09/30/18  1958     History   Chief Complaint Chief Complaint  Patient presents with  . Seizures    HPI [name] is a 58 y.o. male.     Patient with history seizures, EMS called to residence re seizure. EMS notes persistent seizure for approximately 20 minutes, generalized tonic clonic. They indicate CBG 90, gave versed 5 mg im. Patient initially seizing on arrival to ED. Patient not verbally responsive - level 5 caveat. No report of fever. No report of trauma or fall. Unknown if patient taking seizure meds or not - history non compliance is noted.   The history is provided by the patient and the EMS personnel. The history is limited by the condition of the patient.  Seizures   Past Medical History:  Diagnosis Date  . Anxiety   . Back pain    low back pain/ compressed disk in lower back.   . CKD (chronic kidney disease), stage II   . Depression   . Hypertension   . Language barrier 03-01-13   Speaks very little English,primary-Vietnamese "[name] Dialect"  . Seizures ([place])    due head trauma-seizures are less, but occurs from mild to more severe, which causes agitation to mental state.  [name] TBI (traumatic brain injury) ([place])    CARE FOR BY BROTHER    Patient Active Problem List   Diagnosis Date Noted  . Noncompliance with medication regimen 11/22/2017  . Altered mental status 03/27/2016  . Sepsis ([place]) 07/03/2015  . CKD (chronic kidney disease), stage II   . Abdominal pain   . Seizures ([place])   . Encounter for therapeutic drug monitoring 01/03/2014  . Partial epilepsy with impairment of consciousness, intractable ([place]) 03/13/2013  . 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 Encounter for long-term (current) use of other medications 03/13/2013  . Weakness generalized 08/05/2012  . Pneumonia 08/04/2012  . HTN (hypertension)  08/04/2012  . Seizure ([place]) 08/04/2012  . Depression 08/04/2012  . Lower extremity pain 12/21/2011  . Dilantin toxicity 12/19/2011  . Encephalopathy 12/19/2011  . Nicole Kindred will get Wo within enhancing 9 101 which it now 500 or iron ad lib. there is no improvement in right 12/18/2011  . Seizure ([place]) 12/17/2011  . Altered mental state 12/17/2011    Past Surgical History:  Procedure Laterality Date  . ABDOMINAL SURGERY    . COLONOSCOPY N/A 03/17/2013   Procedure: COLONOSCOPY;  Surgeon: [name], MD;  Location: [facility] ENDOSCOPY;  Service: Endoscopy;  Laterality: N/A;  . NO PAST SURGERIES     ? one surgery in [place]-? what, has abdominal scar.        Home Medications    Prior to Admission medications   Medication Sig Start Date End Date Taking? Authorizing Provider  Cetirizine HCl 10 MG CAPS Take by mouth as needed.    [provider]  FLUoxetine (PROZAC) 20 MG capsule Take 20 mg by mouth daily.    [provider]  fluticasone (FLONASE) 50 MCG/ACT nasal spray Place into both nostrils daily.    [provider]  levETIRAcetam (KEPPRA) 1000 MG tablet Take 1.5 tabs (1500mg ) twice daily 08/08/18   Lomax, Amy, NP  lisinopril (PRINIVIL,ZESTRIL) 10 MG tablet Take 10 mg by mouth daily.    [provider]  phenytoin (DILANTIN) 200 MG ER capsule Take 1 capsule (200 mg total) by mouth 2 (two) times  daily. 08/08/18   [name], NP    Family History Family History  Problem Relation Age of Onset  . Seizures Mother        per his nonbiological brother    Social History Social History   Tobacco Use  . Smoking status: Former Smoker    Quit date: 03/02/1989    Years since quitting: 29.6  . Smokeless tobacco: Never Used  Substance Use Topics  . Alcohol use: No  . Drug use: No     Allergies   Patient has no known allergies.   Review of Systems Review of Systems  Unable to perform ROS: Patient unresponsive  Neurological: Positive for seizures.   level 5 caveat - pt unresponsive.    Physical Exam Updated Vital Signs BP 134/86 (BP Location: Right Arm)   Pulse (!) 112   Temp 97.9 F (36.6 C) (Temporal)   Resp (!) 23   SpO2 100%   Physical Exam Vitals signs and nursing note reviewed.  Constitutional:      General: He is in acute distress.     Appearance: He is well-developed.  HENT:     Head: Atraumatic.     Nose: Nose normal.     Mouth/Throat:     Mouth: Mucous membranes are moist.     Pharynx: Oropharynx is clear.  Eyes:     General: No scleral icterus.    Conjunctiva/sclera: Conjunctivae normal.     Pupils: Pupils are equal, round, and reactive to light.  Neck:     Musculoskeletal: Normal range of motion and neck supple. No neck rigidity.     Vascular: No carotid bruit.     Trachea: No tracheal deviation.     Comments: No stiffness or rigidity.  Cardiovascular:     Rate and Rhythm: Regular rhythm. Tachycardia present.     Pulses: Normal pulses.     Heart sounds: Normal heart sounds. No murmur. No friction rub. No gallop.   Pulmonary:     Effort: Pulmonary effort is normal. No accessory muscle usage or respiratory distress.     Breath sounds: Normal breath sounds.  Abdominal:     General: Bowel sounds are normal. There is no distension.     Palpations: Abdomen is soft.     Tenderness: There is no abdominal tenderness. There is no guarding.  Genitourinary:    Comments: No cva tenderness. Musculoskeletal:        General: No swelling.     Comments: CTLS spine, non tender, aligned, no step off. No focal bony tenderness on bil extremity exam. IO line right tibia.   Skin:    General: Skin is warm and dry.     Findings: No rash.  Neurological:     Mental Status: He is alert.     Comments: Patient seizing on arrival. Not verbally responsive.   Psychiatric:     Comments: Seizing.       ED Treatments / Results  Labs (all labs ordered are listed, but only abnormal results are displayed) Results for orders  placed or performed during the hospital encounter of 09/30/18  SARS Coronavirus 2 [facility] order, Performed in [facility] hospital lab) Nasopharyngeal Nasopharyngeal Swab   Specimen: Nasopharyngeal Swab  Result Value Ref Range   SARS Coronavirus 2 NEGATIVE NEGATIVE  CBC  Result Value Ref Range   WBC 11.6 (H) 4.0 - 10.5 K/uL   RBC 4.24 4.22 - 5.81 MIL/uL   Hemoglobin 13.5 13.0 - 17.0 g/dL   HCT  39.5 39.0 - 52.0 %   MCV 93.2 80.0 - 100.0 fL   MCH 31.8 26.0 - 34.0 pg   MCHC 34.2 30.0 - 36.0 g/dL   RDW 12.9 11.5 - 15.5 %   Platelets 343 150 - 400 K/uL   nRBC 0.0 0.0 - 0.2 %  Comprehensive metabolic panel  Result Value Ref Range   Sodium 134 (L) 135 - 145 mmol/L   Potassium 3.3 (L) 3.5 - 5.1 mmol/L   Chloride 100 98 - 111 mmol/L   CO2 22 22 - 32 mmol/L   Glucose, Bld 106 (H) 70 - 99 mg/dL   BUN 7 6 - 20 mg/dL   Creatinine, Ser 1.43 (H) 0.61 - 1.24 mg/dL   Calcium 8.4 (L) 8.9 - 10.3 mg/dL   Total Protein 7.7 6.5 - 8.1 g/dL   Albumin 3.8 3.5 - 5.0 g/dL   AST 24 15 - 41 U/L   ALT 15 0 - 44 U/L   Alkaline Phosphatase 77 38 - 126 U/L   Total Bilirubin 0.5 0.3 - 1.2 mg/dL   GFR calc non Af Amer 53 (L) >60 mL/min   GFR calc Af Amer >60 >60 mL/min   Anion gap 12 5 - 15  Phenytoin level, total  Result Value Ref Range   Phenytoin Lvl 29.0 (H) 10.0 - 20.0 ug/mL  POC CBG, ED  Result Value Ref Range   Glucose-Capillary 103 (H) 70 - 99 mg/dL   Ct Head Wo Contrast  Result Date: 09/30/2018 CLINICAL DATA:  Seizure activity EXAM: CT HEAD WITHOUT CONTRAST TECHNIQUE: Contiguous axial images were obtained from the base of the skull through the vertex without intravenous contrast. COMPARISON:  March 27, 2016. FINDINGS: Brain: No evidence of acute infarction, hemorrhage, hydrocephalus, extra-axial collection or mass lesion/mass effect. Chronic microvascular ischemic changes and an old left basal ganglia lacunar infarct are again noted. The posterior fossa is poorly evaluated secondary to  extensive streak artifact from a metallic structure along the patient's right lateral aspect. Examination was limited by motion artifact. Vascular: No hyperdense vessel or unexpected calcification. Skull: Normal. Negative for fracture or focal lesion. Sinuses/Orbits: There is mild mucosal thickening of the bilateral maxillary sinuses and ethmoid air cells, otherwise the remaining paranasal sinuses and mastoid air cells are essentially clear. Other: None. IMPRESSION: 1. Study degraded by motion artifact and streak artifact as detailed above. 2. Given these limitations, there is no acute intracranial abnormality detected. Electronically Signed   By: Constance Holster M.D.   On: 09/30/2018 22:01    EKG EKG Interpretation  Date/Time:  Friday September 30 2018 20:04:05 EDT Ventricular Rate:  110 PR Interval:    QRS Duration: 99 QT Interval:  353 QTC Calculation: 478 R Axis:   3 Text Interpretation:  Sinus tachycardia RSR' in V1 or V2, right VCD or RVH Borderline prolonged QT interval ` no acute change compared to ecf 07/03/15 Confirmed by Lajean Saver [phone]) on 09/30/2018 9:16:50 PM   Radiology Ct Head Wo Contrast  Result Date: 09/30/2018 CLINICAL DATA:  Seizure activity EXAM: CT HEAD WITHOUT CONTRAST TECHNIQUE: Contiguous axial images were obtained from the base of the skull through the vertex without intravenous contrast. COMPARISON:  March 27, 2016. FINDINGS: Brain: No evidence of acute infarction, hemorrhage, hydrocephalus, extra-axial collection or mass lesion/mass effect. Chronic microvascular ischemic changes and an old left basal ganglia lacunar infarct are again noted. The posterior fossa is poorly evaluated secondary to extensive streak artifact from a metallic structure along the patient's right lateral  aspect. Examination was limited by motion artifact. Vascular: No hyperdense vessel or unexpected calcification. Skull: Normal. Negative for fracture or focal lesion. Sinuses/Orbits: There is  mild mucosal thickening of the bilateral maxillary sinuses and ethmoid air cells, otherwise the remaining paranasal sinuses and mastoid air cells are essentially clear. Other: None. IMPRESSION: 1. Study degraded by motion artifact and streak artifact as detailed above. 2. Given these limitations, there is no acute intracranial abnormality detected. Electronically Signed   By: Constance Holster M.D.   On: 09/30/2018 22:01    Procedures Procedures (including critical care time)  Medications Ordered in ED Medications  0.9 %  sodium chloride infusion (has no administration in time range)     Initial Impression / Assessment and Plan / ED Course  I have reviewed the triage vital signs and the nursing notes.  Pertinent labs & imaging results that were available during my care of the patient were reviewed by me and considered in my medical decision making (see chart for details).  Iv ns. Continuous pulse ox and monitoring. o2 mask.  EMS gave additional versed 5 mg IO upon arrival - seizure activity stopped in ED.   Seizure precautions.   CBG recheck.  Reviewed nursing notes and prior charts for additional history.   Stat labs sent.   Labs reviewed by me - glucose normal. Dilantin level high 29.  Discussed pt with neurology on call/neuro consult - Dr Carloyn Jaeger indicates give keppra 1000 mg iv. Can admit to medicine.   Given seizures/prolonged seizure despite supratherapeutic dilantin, will get head ct.   Ct reviewed by me - no acute hemorrhage.   Neurochecks, reassessments - no seizure activity, pt remains postictal/drowsy, more responsive than prior.   Medicine service consulted for admission.  CRITICAL CARE RE: prolonged/recurrent seizure, prolonged postictal state, supratherapeutic dilantinv level.  Performed by: Mirna Mires Total critical care time: 40 minutes Critical care time was exclusive of separately billable procedures and treating other patients. Critical care was  necessary to treat or prevent imminent or life-threatening deterioration. Critical care was time spent personally by me on the following activities: development of treatment plan with patient and/or surrogate as well as nursing, discussions with consultants, evaluation of patient's response to treatment, examination of patient, obtaining history from patient or surrogate, ordering and performing treatments and interventions, ordering and review of laboratory studies, ordering and review of radiographic studies, pulse oximetry and re-evaluation of patient's condition.   Final Clinical Impressions(s) / ED Diagnoses   Final diagnoses:  None    ED Discharge Orders    None       Lajean Saver, MD 09/30/18 2220

## 2018-09-30 NOTE — ED Notes (Signed)
O2 decreased from 4L to 2L.

## 2018-09-30 NOTE — ED Notes (Signed)
Pt to CT via stretcher

## 2018-10-01 DIAGNOSIS — N182 Chronic kidney disease, stage 2 (mild): Secondary | ICD-10-CM | POA: Diagnosis present

## 2018-10-01 DIAGNOSIS — N179 Acute kidney failure, unspecified: Secondary | ICD-10-CM | POA: Diagnosis present

## 2018-10-01 DIAGNOSIS — R569 Unspecified convulsions: Secondary | ICD-10-CM | POA: Diagnosis not present

## 2018-10-01 DIAGNOSIS — G9340 Encephalopathy, unspecified: Secondary | ICD-10-CM

## 2018-10-01 DIAGNOSIS — Z87820 Personal history of traumatic brain injury: Secondary | ICD-10-CM | POA: Diagnosis not present

## 2018-10-01 DIAGNOSIS — E871 Hypo-osmolality and hyponatremia: Secondary | ICD-10-CM | POA: Diagnosis present

## 2018-10-01 DIAGNOSIS — F329 Major depressive disorder, single episode, unspecified: Secondary | ICD-10-CM | POA: Diagnosis present

## 2018-10-01 DIAGNOSIS — R471 Dysarthria and anarthria: Secondary | ICD-10-CM | POA: Diagnosis present

## 2018-10-01 DIAGNOSIS — Z87891 Personal history of nicotine dependence: Secondary | ICD-10-CM | POA: Diagnosis not present

## 2018-10-01 DIAGNOSIS — Z7951 Long term (current) use of inhaled steroids: Secondary | ICD-10-CM | POA: Diagnosis not present

## 2018-10-01 DIAGNOSIS — E876 Hypokalemia: Secondary | ICD-10-CM | POA: Diagnosis not present

## 2018-10-01 DIAGNOSIS — T420X5A Adverse effect of hydantoin derivatives, initial encounter: Secondary | ICD-10-CM | POA: Diagnosis present

## 2018-10-01 DIAGNOSIS — G40909 Epilepsy, unspecified, not intractable, without status epilepticus: Secondary | ICD-10-CM | POA: Diagnosis present

## 2018-10-01 DIAGNOSIS — I10 Essential (primary) hypertension: Secondary | ICD-10-CM | POA: Diagnosis not present

## 2018-10-01 DIAGNOSIS — Z20828 Contact with and (suspected) exposure to other viral communicable diseases: Secondary | ICD-10-CM | POA: Diagnosis present

## 2018-10-01 DIAGNOSIS — I129 Hypertensive chronic kidney disease with stage 1 through stage 4 chronic kidney disease, or unspecified chronic kidney disease: Secondary | ICD-10-CM | POA: Diagnosis present

## 2018-10-01 LAB — COMPREHENSIVE METABOLIC PANEL
ALT: 15 U/L (ref 0–44)
AST: 28 U/L (ref 15–41)
Albumin: 3.8 g/dL (ref 3.5–5.0)
Alkaline Phosphatase: 72 U/L (ref 38–126)
Anion gap: 10 (ref 5–15)
BUN: 5 mg/dL — ABNORMAL LOW (ref 6–20)
CO2: 25 mmol/L (ref 22–32)
Calcium: 8.7 mg/dL — ABNORMAL LOW (ref 8.9–10.3)
Chloride: 100 mmol/L (ref 98–111)
Creatinine, Ser: 1.24 mg/dL (ref 0.61–1.24)
GFR calc Af Amer: >60 mL/min (ref >60)
GFR calc non Af Amer: >60 mL/min (ref >60)
Glucose, Bld: 117 mg/dL — ABNORMAL HIGH (ref 70–99)
Potassium: 3.7 mmol/L (ref 3.5–5.1)
Sodium: 135 mmol/L (ref 135–145)
Total Bilirubin: 0.5 mg/dL (ref 0.3–1.2)
Total Protein: 7.6 g/dL (ref 6.5–8.1)

## 2018-10-01 LAB — CBC
HCT: 40.5 % (ref 39.0–52.0)
Hemoglobin: 13.5 g/dL (ref 13.0–17.0)
MCH: 31.2 pg (ref 26.0–34.0)
MCHC: 33.3 g/dL (ref 30.0–36.0)
MCV: 93.5 fL (ref 80.0–100.0)
Platelets: 326 10*3/uL (ref 150–400)
RBC: 4.33 MIL/uL (ref 4.22–5.81)
RDW: 12.9 % (ref 11.5–15.5)
WBC: 14.6 10*3/uL — ABNORMAL HIGH (ref 4.0–10.5)
nRBC: 0 % (ref 0.0–0.2)

## 2018-10-01 LAB — HIV ANTIBODY (ROUTINE TESTING W REFLEX): HIV Screen 4th Generation wRfx: NONREACTIVE

## 2018-10-01 LAB — TSH: TSH: 1.107 u[IU]/mL (ref 0.350–4.500)

## 2018-10-01 LAB — MAGNESIUM: Magnesium: 1.7 mg/dL (ref 1.7–2.4)

## 2018-10-01 LAB — PHOSPHORUS: Phosphorus: 2.6 mg/dL (ref 2.5–4.6)

## 2018-10-01 MED ORDER — MAGNESIUM SULFATE 2 GM/50ML IV SOLN
2.0000 g | Freq: Once | INTRAVENOUS | Status: AC
  Administered 2018-10-01: 2 g via INTRAVENOUS
  Filled 2018-10-01: qty 50

## 2018-10-01 MED ORDER — LORAZEPAM 2 MG/ML IJ SOLN
1.0000 mg | Freq: Once | INTRAMUSCULAR | Status: AC
  Administered 2018-10-01: 1 mg via INTRAVENOUS
  Filled 2018-10-01: qty 1

## 2018-10-01 MED ORDER — DEXTROSE-NACL 5-0.9 % IV SOLN
INTRAVENOUS | Status: DC
  Administered 2018-10-01 – 2018-10-02 (×2): via INTRAVENOUS

## 2018-10-01 MED ORDER — ONDANSETRON HCL 4 MG PO TABS: 4.0000 mg | ORAL_TABLET | Freq: Four times a day (QID) | ORAL | Status: DC | PRN

## 2018-10-01 MED ORDER — POTASSIUM CHLORIDE CRYS ER 20 MEQ PO TBCR
40.0000 meq | EXTENDED_RELEASE_TABLET | Freq: Once | ORAL | Status: AC
  Administered 2018-10-01: 40 meq via ORAL
  Filled 2018-10-01: qty 2

## 2018-10-01 MED ORDER — ACETAMINOPHEN 650 MG RE SUPP: 650.0000 mg | Freq: Four times a day (QID) | RECTAL | Status: DC | PRN

## 2018-10-01 MED ORDER — ACETAMINOPHEN 325 MG PO TABS
650.0000 mg | ORAL_TABLET | Freq: Four times a day (QID) | ORAL | Status: DC | PRN
  Administered 2018-10-01: 650 mg via ORAL
  Filled 2018-10-01: qty 2

## 2018-10-01 MED ORDER — FLUOXETINE HCL 20 MG PO CAPS
20.0000 mg | ORAL_CAPSULE | Freq: Every day | ORAL | Status: DC
  Administered 2018-10-01 – 2018-10-05 (×5): 20 mg via ORAL
  Filled 2018-10-01 (×5): qty 1

## 2018-10-01 MED ORDER — LEVETIRACETAM IN NACL 1500 MG/100ML IV SOLN
1500.0000 mg | Freq: Two times a day (BID) | INTRAVENOUS | Status: DC
  Administered 2018-10-01 – 2018-10-02 (×3): 1500 mg via INTRAVENOUS
  Filled 2018-10-01 (×4): qty 100

## 2018-10-01 MED ORDER — POTASSIUM CHLORIDE 10 MEQ/100ML IV SOLN
10.0000 meq | INTRAVENOUS | Status: AC
  Administered 2018-10-01 (×2): 10 meq via INTRAVENOUS
  Filled 2018-10-01 (×2): qty 100

## 2018-10-01 MED ORDER — LISINOPRIL 10 MG PO TABS
10.0000 mg | ORAL_TABLET | Freq: Every day | ORAL | Status: DC
  Administered 2018-10-01 – 2018-10-05 (×5): 10 mg via ORAL
  Filled 2018-10-01 (×4): qty 1
  Filled 2018-10-01: qty 4

## 2018-10-01 MED ORDER — ONDANSETRON HCL 4 MG/2ML IJ SOLN: 4.0000 mg | Freq: Four times a day (QID) | INTRAMUSCULAR | Status: DC | PRN

## 2018-10-01 MED ORDER — SODIUM CHLORIDE 0.9 % IV SOLN
INTRAVENOUS | Status: AC
  Administered 2018-10-01: 01:00:00 via INTRAVENOUS

## 2018-10-01 NOTE — ED Notes (Signed)
ED TO INPATIENT HANDOFF REPORT  ED Nurse Name and Phone #: Larose Kells [phone]  S Name/Age/Gender [name] 58 y.o. male Room/Bed: TRACC/TRACC  Code Status   Code Status: Prior  Home/SNF/Other Home Patient oriented to: self Is this baseline? No   Triage Complete: Triage complete  Chief Complaint Seizure   Triage Note Patient with multiple seizures today.  EMS picked him up from home.  He had one continuous seizure for 20 mins, was given 5mg  Versed IM and then 5mg  IO by EMS.  Patient arrives with rapid eye movement and muscle tics.  Patient unable to answer RN upon arrival.  Left eye gaze to the left.  Patient has had this in the past, noncompliant with meds per EMS and family.     Allergies No Known Allergies  Level of Care/Admitting Diagnosis ED Disposition    ED Disposition Condition Comment   Admit  Hospital Area: [facility] [[id number]]  Level of Care: Progressive [102]  I expect the patient will be discharged within 24 hours: No (not a candidate for 5C-Observation unit)  Covid Evaluation: Confirmed COVID Negative  Diagnosis: Prolonged seizure [facility]) [[id number]]  Admitting Physician: Toy Baker [[id number]]  Attending Physician: Toy Baker [[id number]]  PT Class (Do Not Modify): Observation [104]  PT Acc Code (Do Not Modify): Observation [10022]       B Medical/Surgery History Past Medical History:  Diagnosis Date  . Anxiety   . Back pain    low back pain/ compressed disk in lower back.   . CKD (chronic kidney disease), stage II   . Depression   . Hypertension   . Language barrier 03-01-13   Speaks very little English,primary-Vietnamese "[name] Dialect"  . Seizures ([place])    due head trauma-seizures are less, but occurs from mild to more severe, which causes agitation to mental state.  [name] TBI (traumatic brain injury) ([place])    CARE FOR BY BROTHER   Past Surgical History:  Procedure Laterality Date  . ABDOMINAL SURGERY    .  COLONOSCOPY N/A 03/17/2013   Procedure: COLONOSCOPY;  Surgeon: [name], MD;  Location: [facility] ENDOSCOPY;  Service: Endoscopy;  Laterality: N/A;  . NO PAST SURGERIES     ? one surgery in [place]-? what, has abdominal scar.     A IV Location/Drains/Wounds Patient Lines/Drains/Airways Status   Active Line/Drains/Airways    Name:   Placement date:   Placement time:   Site:   Days:   Peripheral IV 09/30/18 Left Wrist   09/30/18    -    Wrist   1   Peripheral IV 09/30/18 Right External jugular   09/30/18    2018    External jugular   1   Wound / Incision (Open or Dehisced) 03/27/16 Non-pressure wound Foot Left;Posterior black dot   03/27/16    1530    Foot   918          Intake/Output Last 24 hours  Intake/Output Summary (Last 24 hours) at 10/01/2018 0012 Last data filed at 09/30/2018 2324 Gross per 24 hour  Intake 54.87 ml  Output 150 ml  Net -95.13 ml    Labs/Imaging Results for orders placed or performed during the hospital encounter of 09/30/18 (from the past 48 hour(s))  CBC     Status: Abnormal   Collection Time: 09/30/18  8:02 PM  Result Value Ref Range   WBC 11.6 (H) 4.0 - 10.5 K/uL   RBC 4.24 4.22 - 5.81 MIL/uL  Hemoglobin 13.5 13.0 - 17.0 g/dL   HCT 39.5 39.0 - 52.0 %   MCV 93.2 80.0 - 100.0 fL   MCH 31.8 26.0 - 34.0 pg   MCHC 34.2 30.0 - 36.0 g/dL   RDW 12.9 11.5 - 15.5 %   Platelets 343 150 - 400 K/uL   nRBC 0.0 0.0 - 0.2 %    Comment: Performed at [facility], [place] [address] [place], [place] [address]  Comprehensive metabolic panel     Status: Abnormal   Collection Time: 09/30/18  8:02 PM  Result Value Ref Range   Sodium 134 (L) 135 - 145 mmol/L   Potassium 3.3 (L) 3.5 - 5.1 mmol/L   Chloride 100 98 - 111 mmol/L   CO2 22 22 - 32 mmol/L   Glucose, Bld 106 (H) 70 - 99 mg/dL   BUN 7 6 - 20 mg/dL   Creatinine, Ser 1.43 (H) 0.61 - 1.24 mg/dL   Calcium 8.4 (L) 8.9 - 10.3 mg/dL   Total Protein 7.7 6.5 - 8.1 g/dL   Albumin 3.8 3.5 - 5.0 g/dL   AST 24  15 - 41 U/L   ALT 15 0 - 44 U/L   Alkaline Phosphatase 77 38 - 126 U/L   Total Bilirubin 0.5 0.3 - 1.2 mg/dL   GFR calc non Af Amer 53 (L) >60 mL/min   GFR calc Af Amer >60 >60 mL/min   Anion gap 12 5 - 15    Comment: Performed at [facility], [place] [address] [place], [place] [address]  Urinalysis, Routine w reflex microscopic     Status: Abnormal   Collection Time: 09/30/18  8:02 PM  Result Value Ref Range   Color, Urine YELLOW YELLOW   APPearance HAZY (A) CLEAR   Specific Gravity, Urine 1.012 1.005 - 1.030   pH 5.0 5.0 - 8.0   Glucose, UA NEGATIVE NEGATIVE mg/dL   Hgb urine dipstick SMALL (A) NEGATIVE   Bilirubin Urine NEGATIVE NEGATIVE   Ketones, ur NEGATIVE NEGATIVE mg/dL   Protein, ur 30 (A) NEGATIVE mg/dL   Nitrite NEGATIVE NEGATIVE   Leukocytes,Ua NEGATIVE NEGATIVE   RBC / HPF 0-5 0 - 5 RBC/hpf   WBC, UA 0-5 0 - 5 WBC/hpf   Bacteria, UA RARE (A) NONE SEEN   Mucus PRESENT    Hyaline Casts, UA PRESENT     Comment: Performed at [facility], [address], [place], [place] [address]  Rapid urine drug screen (hospital performed)     Status: Abnormal   Collection Time: 09/30/18  8:02 PM  Result Value Ref Range   Opiates NONE DETECTED NONE DETECTED   Cocaine NONE DETECTED NONE DETECTED   Benzodiazepines POSITIVE (A) NONE DETECTED   Amphetamines NONE DETECTED NONE DETECTED   Tetrahydrocannabinol NONE DETECTED NONE DETECTED   Barbiturates NONE DETECTED NONE DETECTED    Comment: (NOTE) DRUG SCREEN FOR MEDICAL PURPOSES ONLY.  IF CONFIRMATION IS NEEDED FOR ANY PURPOSE, NOTIFY LAB WITHIN 5 DAYS. LOWEST DETECTABLE LIMITS FOR URINE DRUG SCREEN Drug Class                     Cutoff (ng/mL) Amphetamine and metabolites    1000 Barbiturate and metabolites    200 Benzodiazepine                 481 Tricyclics and metabolites     300 Opiates and metabolites        300 Cocaine  and metabolites        300 THC                            50 Performed at [facility], [place] [address] [place], [place] [address]   Phenytoin level, total     Status: Abnormal   Collection Time: 09/30/18  8:02 PM  Result Value Ref Range   Phenytoin Lvl 29.0 (H) 10.0 - 20.0 ug/mL    Comment: Performed at [facility] [address] [place], [place] [address]  POC CBG, ED     Status: Abnormal   Collection Time: 09/30/18  8:07 PM  Result Value Ref Range   Glucose-Capillary 103 (H) 70 - 99 mg/dL  SARS Coronavirus 2 [facility] order, Performed in [facility] hospital lab) Nasopharyngeal Nasopharyngeal Swab     Status: None   Collection Time: 09/30/18  8:20 PM   Specimen: Nasopharyngeal Swab  Result Value Ref Range   SARS Coronavirus 2 NEGATIVE NEGATIVE    Comment: (NOTE) If result is NEGATIVE SARS-CoV-2 target nucleic acids are NOT DETECTED. The SARS-CoV-2 RNA is generally detectable in upper and lower  respiratory specimens during the acute phase of infection. The lowest  concentration of SARS-CoV-2 viral copies this assay can detect is 250  copies / mL. A negative result does not preclude SARS-CoV-2 infection  and should not be used as the sole basis for treatment or other  patient management decisions.  A negative result may occur with  improper specimen collection / handling, submission of specimen other  than nasopharyngeal swab, presence of viral mutation(s) within the  areas targeted by this assay, and inadequate number of viral copies  (<250 copies / mL). A negative result must be combined with clinical  observations, patient history, and epidemiological information. If result is POSITIVE SARS-CoV-2 target nucleic acids are DETECTED. The SARS-CoV-2 RNA is generally detectable in upper and lower  respiratory specimens dur ing the acute phase of infection.  Positive  results are indicative of active infection with SARS-CoV-2.  Clinical  correlation with patient history and other diagnostic information is  necessary to determine patient infection  status.  Positive results do  not rule out bacterial infection or co-infection with other viruses. If result is PRESUMPTIVE POSTIVE SARS-CoV-2 nucleic acids MAY BE PRESENT.   A presumptive positive result was obtained on the submitted specimen  and confirmed on repeat testing.  While 2019 novel coronavirus  (SARS-CoV-2) nucleic acids may be present in the submitted sample  additional confirmatory testing may be necessary for epidemiological  and / or clinical management purposes  to differentiate between  SARS-CoV-2 and other Sarbecovirus currently known to infect humans.  If clinically indicated additional testing with an alternate test  methodology [phone]) is advised. The SARS-CoV-2 RNA is generally  detectable in upper and lower respiratory sp ecimens during the acute  phase of infection. The expected result is Negative. Fact Sheet for Patients:  StrictlyIdeas.no Fact Sheet for Healthcare Providers: BankingDealers.co.za This test is not yet approved or cleared by the [place] FDA and has been authorized for detection and/or diagnosis of SARS-CoV-2 by FDA under an Emergency Use Authorization (EUA).  This EUA will remain in effect (meaning this test can be used) for the duration of the COVID-19 declaration under Section 564(b)(1) of the Act, 21 U.S.C. section 360bbb-3(b)(1), unless the authorization is terminated or revoked sooner. Performed at [facility], [address], [place], [place] [address]    Ct Head Wo Contrast  Result Date: 09/30/2018 CLINICAL DATA:  Seizure activity EXAM: CT HEAD WITHOUT CONTRAST TECHNIQUE: Contiguous axial images were obtained from the base of the skull through the vertex without intravenous contrast. COMPARISON:  March 27, 2016. FINDINGS: Brain: No evidence of acute infarction, hemorrhage, hydrocephalus, extra-axial collection or mass lesion/mass effect. Chronic microvascular ischemic  changes and an old left basal ganglia lacunar infarct are again noted. The posterior fossa is poorly evaluated secondary to extensive streak artifact from a metallic structure along the patient's right lateral aspect. Examination was limited by motion artifact. Vascular: No hyperdense vessel or unexpected calcification. Skull: Normal. Negative for fracture or focal lesion. Sinuses/Orbits: There is mild mucosal thickening of the bilateral maxillary sinuses and ethmoid air cells, otherwise the remaining paranasal sinuses and mastoid air cells are essentially clear. Other: None. IMPRESSION: 1. Study degraded by motion artifact and streak artifact as detailed above. 2. Given these limitations, there is no acute intracranial abnormality detected. Electronically Signed   By: Constance Holster M.D.   On: 09/30/2018 22:01   Dg Chest Port 1 View  Result Date: 09/30/2018 CLINICAL DATA:  Multiple seizures EXAM: PORTABLE CHEST 1 VIEW COMPARISON:  Jul 03, 2015 FINDINGS: The patient is slightly rotated, however the cardiomediastinal silhouette appears to be unremarkable. Large airspace consolidation or pleural effusion. No acute osseous abnormality. IMPRESSION: No acute cardiopulmonary process. Electronically Signed   By: Prudencio Pair M.D.   On: 09/30/2018 23:04    Pending Labs Unresulted Labs (From admission, onward)    Start     Ordered   09/30/18 2002  Levetiracetam level  Once,   STAT     09/30/18 2001   Signed and Held  HIV antibody (Routine Testing)  Tomorrow morning,   R     Signed and Held   Signed and Held  Magnesium  Tomorrow morning,   R    Comments: Call MD if <1.5    Signed and Held   Signed and Held  Phosphorus  Tomorrow morning,   R     Signed and Held   Signed and Held  TSH  Once,   R    Comments: Cancel if already done within 1 month and notify MD    Signed and Held   Signed and Held  Comprehensive metabolic panel  Once,   R    Comments: Cal MD for K<3.5 or >5.0    Signed and Held    Signed and Held  CBC  Once,   R    Comments: Call for hg <8.0    Signed and Held   Signed and Held  Magnesium  Add-on,   R     Signed and Held   Signed and Held  Phosphorus  Add-on,   R     Signed and Held          Vitals/Pain Today's Vitals   09/30/18 2001 09/30/18 2130 09/30/18 2315 10/01/18 0000  BP: 134/86 (!) 146/94 129/81 (!) 131/95  Pulse: (!) 112 (!) 106 100 92  Resp: (!) 23 16 16  (!) 27  Temp: 97.9 F (36.6 C)     TempSrc: Temporal     SpO2: 100% 100% 99% 100%    Isolation Precautions No active isolations  Medications Medications  0.9 %  sodium chloride infusion ( Intravenous New Bag/Given 09/30/18 2235)  levETIRAcetam (KEPPRA) IVPB 1000 mg/100 mL premix (0 mg Intravenous Stopped 09/30/18 2324)  Mobility walks Low fall risk   Focused Assessments Neuro Assessment Handoff:  Swallow screen pass? n/a         Neuro Assessment: Exceptions to WDL Neuro Checks:      Last Documented NIHSS Modified Score:   Has TPA been given? No If patient is a Neuro Trauma and patient is going to OR before floor call report to [facility] nurse: [phone] or [phone]     R Recommendations: See Admitting Provider Note  Report given to:   Additional Notes:

## 2018-10-01 NOTE — Consult Note (Signed)
Neurology Consultation  Reason for Consult: Breakthrough seizure Referring Physician: Dr. Roel Cluck  CC: Breakthrough seizure  History is obtained from: Chart review  HPI: [name] is a 58 y.o. male who has a past medical history of seizures-currently on Dilantin and Keppra, CKD, depression, hypertension, TBI, brought into the emergency room for multiple seizures for which EMS was called.  No family members at bedside to provide history.  History obtained chart review-presumably had seizure for 20 to 30 minutes requiring 5 mg of Versed IM and 5 mg of Versed IO by EMS. Continue to have rapid eye movements and muscle twitching in the emergency room per the H&P. Returning back to baseline.  Labs drawn.  Dilantin supratherapeutic. Neurological consultation for breakthrough seizures and management of seizures as well as antiepileptics. No reported fevers.  No reported preceding symptoms of fever chills.  Patient unable to reliably provide any history. Per chart review, last seizure was 2019. Follows up with Dr. Evelena Leyden at [facility] neurology.  Last visit 08/08/2018-telemedicine visit.  Visit prior to that in 2019.  Exam listed as awake alert alert oriented, left exotropia, no motor deficits, no sensory deficits, no dysmetria, blunted reflexes all over and normal gait. Chart has listed that he speaks little English but was able to follow simple commands consistently on exam.  Will attempt to reach family.  LKW: Prior to arrival tpa given?: no, seizure-known seizure patient with breakthrough seizure. Premorbid modified Rankin scale (mRS): 2  ROS: Unable to obtain due to altered mental status.   Past Medical History:  Diagnosis Date  . Anxiety   . Back pain    low back pain/ compressed disk in lower back.   . CKD (chronic kidney disease), stage II   . Depression   . Hypertension   . Language barrier 03-01-13   Speaks very little English,primary-Vietnamese "[name] Dialect"  . Seizures ([place])    due  head trauma-seizures are less, but occurs from mild to more severe, which causes agitation to mental state.  [name] TBI (traumatic brain injury) ([place])    CARE FOR BY BROTHER    Family History  Problem Relation Age of Onset  . Seizures Mother        per his nonbiological brother    Social History:   reports that he quit smoking about 29 years ago. He has never used smokeless tobacco. He reports that he does not drink alcohol or use drugs.  Medications  Current Facility-Administered Medications:  .  0.9 %  sodium chloride infusion, , Intravenous, Continuous, Lajean Saver, MD, Last Rate: 20 mL/hr at 09/30/18 2235  Current Outpatient Medications:  .  Cetirizine HCl 10 MG CAPS, Take by mouth as needed., Disp: , Rfl:  .  FLUoxetine (PROZAC) 20 MG capsule, Take 20 mg by mouth daily., Disp: , Rfl:  .  fluticasone (FLONASE) 50 MCG/ACT nasal spray, Place into both nostrils daily., Disp: , Rfl:  .  levETIRAcetam (KEPPRA) 1000 MG tablet, Take 1.5 tabs (1500mg ) twice daily, Disp: 270 tablet, Rfl: 3 .  lisinopril (PRINIVIL,ZESTRIL) 10 MG tablet, Take 10 mg by mouth daily., Disp: , Rfl:  .  phenytoin (DILANTIN) 200 MG ER capsule, Take 1 capsule (200 mg total) by mouth 2 (two) times daily., Disp: 180 capsule, Rfl: 3  Exam: Current vital signs: BP 129/81   Pulse 100   Temp 97.9 F (36.6 C) (Temporal)   Resp 16   SpO2 99%  Vital signs in last 24 hours: Temp:  [97.9 F (36.6 C)]  97.9 F (36.6 C) (07/31 2001) Pulse Rate:  [100-112] 100 (07/31 2315) Resp:  [16-23] 16 (07/31 2315) BP: (129-146)/(81-94) 129/81 (07/31 2315) SpO2:  [99 %-100 %] 99 % (07/31 2315) GENERAL: Awake, alert in NAD HEENT: - Normocephalic and atraumatic, dry mm, no LN++, no Thyromegally.  No neck stiffness LUNGS - Clear to auscultation bilaterally with no wheezes CV - S1S2 RRR, no m/r/g, equal pulses bilaterally. ABDOMEN - Soft, nontender, nondistended with normoactive BS Ext: warm, well perfused, intact peripheral pulses,  no edema NEURO:  Mental Status: Awake, alert, oriented to self. Language: speech is mildly dysarthric.poor attention concentration.  Was able to follow simple commands and mimic simple commands but could not name objects-unclear if this was due to language barrier.  A interpreter was not readily available, family was unreachable over the phone Cranial Nerves: PERRL. EOMI left exotropia, visual fields full, no facial asymmetry facial sensation intact, hearing intact, tongue/uvula/soft palate midline, normal Motor: Able to raise all 4 extremities antigravity. Tone: is normal and bulk is normal Sensation- Intact to light touch bilaterally Coordination: Did not reliably follow commands and did not mimic for a good exam but had no gross dysmetria evident on reach for objects Gait- deferred  Labs I have reviewed labs in epic and the results pertinent to this consultation are:  CBC    Component Value Date/Time   WBC 11.6 (H) 09/30/2018 2002   RBC 4.24 09/30/2018 2002   HGB 13.5 09/30/2018 2002   HGB 14.6 03/22/2017 1015   HCT 39.5 09/30/2018 2002   HCT 41.7 03/22/2017 1015   PLT 343 09/30/2018 2002   PLT 418 (H) 03/22/2017 1015   MCV 93.2 09/30/2018 2002   MCV 90 03/22/2017 1015   MCH 31.8 09/30/2018 2002   MCHC 34.2 09/30/2018 2002   RDW 12.9 09/30/2018 2002   RDW 14.7 03/22/2017 1015   LYMPHSABS 1.5 03/22/2017 1015   MONOABS 0.9 03/27/2016 0115   EOSABS 0.4 03/22/2017 1015   BASOSABS 0.0 03/22/2017 1015    CMP     Component Value Date/Time   NA 134 (L) 09/30/2018 2002   NA 144 03/22/2017 1015   K 3.3 (L) 09/30/2018 2002   CL 100 09/30/2018 2002   CO2 22 09/30/2018 2002   GLUCOSE 106 (H) 09/30/2018 2002   BUN 7 09/30/2018 2002   BUN 13 03/22/2017 1015   CREATININE 1.43 (H) 09/30/2018 2002   CALCIUM 8.4 (L) 09/30/2018 2002   PROT 7.7 09/30/2018 2002   PROT 8.4 03/22/2017 1015   ALBUMIN 3.8 09/30/2018 2002   ALBUMIN 4.4 03/22/2017 1015   AST 24 09/30/2018 2002   ALT 15  09/30/2018 2002   ALKPHOS 77 09/30/2018 2002   BILITOT 0.5 09/30/2018 2002   BILITOT 0.3 03/22/2017 1015   GFRNONAA 53 (L) 09/30/2018 2002   GFRAA >60 09/30/2018 2002  Mild leukocytosis. Mild hyponatremia sodium 134. Hypokalemia potassium 3.3 Elevated creatinine at 1.43 which is above his baseline  Phenytoin level-29-high   Imaging I have reviewed the images obtained:  CT-scan of the brain-motion degraded with no acute changes within the constraints of motion  Assessment:  58 year old man with history of seizures and other history as documented above presenting with breakthrough seizures today. He is returning back to baseline albeit slowly which I suspect is due to the multiplicity of seizures. His labs reveal some AKI and mild leukocytosis-along with mild hyponatremia-likely reactive versus dehydration. His phenytoin level is elevated-29 (normal 10-20).  At this point is not exhibiting any  clear signs of phenytoin toxicity other than the derangement of mental status but that could be due to the multiplicity of seizures also.  He has had prior labs with high levels of phenytoin and January 2019.  It might be worthwhile considering an alternative to phenytoin for him in the longer term  Impression: Breakthrough seizure Phenytoin toxicity  Recommendations: -Maintain seizure precautions -Recommended Keppra load that has been given-1 g IV -Continue  Keppra-1500 twice daily-ordered by hospitalist. -Hold phenytoin.  Consider alternative agent rather than resuming once Dilantin is in the therapeutic range.   -Check for any underlying infections -Monitor BMP to ensure metabolic derangements have been resolved -Obtain routine EEG in the morning. -Might need social work help as I fear that there might be some problem with medication administration at home as he has been on a stable dose for a while but levels are high-could be dosing errors at home amongst other causes.  I have not been  able to personally speak with any family member-attempted to call the phone number listed on chart but it went unanswered -Discussed my plan briefly prior to seeing the patient with the admitting hospitalist Dr. Roel Cluck.  Inpatient neurology will follow with you -- Amie Portland, MD [facility] Pager: [phone] If 7pm to 7am, please call on call as listed on AMION.

## 2018-10-01 NOTE — Progress Notes (Signed)
PROGRESS NOTE    [name]  ZOX:[id number] DOB: Jun 24, 1960 DOA: 09/30/2018 PCP: [name], MD    Brief Narrative:  58 year old male who presented with seizures.  She does have significant past medical history for seizure disorder, chronic kidney disease, hypertension, depression and traumatic brain injury.  She was witnessed to have multiple seizures, generalized, EMS administered 5 mg of Versed IM and 5 intraosseous with patient persistent symptoms of rapid eye movements and muscle twitching.  Apparently patient has been noncompliant with her medications.  On initial physical examination blood pressure 146/94, heart rate 106, temperature 97.9, respiratory rate 16, oxygen saturation 100%.  Patient was awake and alert, her lungs are clear to auscultation bilaterally, heart S1-S2 present and rhythmic, the abdomen was soft.  She had dysarthria and poor attention concentration. Sodium 134, potassium 3.3, chloride 100, bicarb 22, glucose 106, BUN 7, creatinine 1.43, white cell count 11.6, hemoglobin 13.5, hematocrit 39.5, platelets 343.  SARS COVID-19 negative.  Urinalysis 0-5 white cells.  Drug screen positive for benzodiazepines.  Head CT no acute changes.  Chest radiograph negative for infiltrates.  EKG 110 bpm, normal axis, normal intervals, sinus rhythm, J-point elevation V1 to V2, poor R wave progression, no T wave changes.  Patient was admitted to the hospital with a working diagnosis of uncontrolled seizures.  Assessment & Plan:   Active Problems:   Encephalopathy   HTN (hypertension)   CKD (chronic kidney disease), stage II   Prolonged seizure ([place])   1. Uncontrolled seizures. Patient continue to be somnolent this am, able to follow commands and respond to yes and no questions. Pending on electroencephalogram. Will continue neuro checks q4 H and aspiration precautions, patient with high risk for recurrent seizures. Continue keppra 1500 mg bid and holding phenytoin per neurology  recommendations, follow on phenytoin level in am. Plan to resume phenytoin when level less than 15, start a lower dose 150 mg bid. Physical therapy and speech therapy.   2. HTN. Blood pressure 128/81 today, continue lisinopril 10 mg daily. Continue blood pressure monitoring.   3.  AKI on CKD stage 2, complicated with Hypokalemia and hypomagnsemia. Patient received 20 meq of Kcl on admission, follow K is 3,7 with Mg at 1,7 and serum cr at 1,24. Will continue electrolyte correction with 40 meq Kcl, 2 grams if Mag sulfate and continue isotonic saline in IV.   4. Depression. Continue with fluoxetine.     DVT prophylaxis: enoxaparin   Code Status:  full Family Communication: no family at the bedside  Disposition Plan/ discharge barriers: pending clinical improvement, will change to inpatient.   There is no height or weight on file to calculate BMI. Malnutrition Type:      Malnutrition Characteristics:      Nutrition Interventions:     RN Pressure Injury Documentation:     Consultants:   Neurology   Procedures:     Antimicrobials:       Subjective: Patient is somnolent today, able to follow commands and respond to simple yes and no questions. Not yet back to his baseline.   Objective: Vitals:   10/01/18 0113 10/01/18 0413 10/01/18 0721 10/01/18 1033  BP: (!) 143/92 122/81 124/83 130/70  Pulse: 90 91 90   Resp:  18 18   Temp: 98.4 F (36.9 C) 99 F (37.2 C) 98.6 F (37 C)   TempSrc: Oral  Oral   SpO2: 99% 97% 96%     Intake/Output Summary (Last 24 hours) at 10/01/2018 1230 Last data filed  at 09/30/2018 2324 Gross per 24 hour  Intake 54.87 ml  Output 150 ml  Net -95.13 ml   There were no vitals filed for this visit.  Examination:   General: deconditioned and ill looking appearing  Neurology:  Somnolent, able to follow commands and answer to simple yes and no questions.  E ENT: mild pallor, no icterus, oral mucosa moist Cardiovascular: No JVD. S1-S2  present, rhythmic, no gallops, rubs, or murmurs. No lower extremity edema. Pulmonary: positive breath sounds bilaterally, adequate air movement, no wheezing, rhonchi or rales. Gastrointestinal. Abdomen with no organomegaly, non tender, no rebound or guarding Skin. No rashes Musculoskeletal: no joint deformities     Data Reviewed: I have personally reviewed following labs and imaging studies  CBC: Recent Labs  Lab 09/30/18 2002 10/01/18 0225  WBC 11.6* 14.6*  HGB 13.5 13.5  HCT 39.5 40.5  MCV 93.2 93.5  PLT 343 937   Basic Metabolic Panel: Recent Labs  Lab 09/30/18 2002 10/01/18 0225  NA 134* 135  K 3.3* 3.7  CL 100 100  CO2 22 25  GLUCOSE 106* 117*  BUN 7 5*  CREATININE 1.43* 1.24  CALCIUM 8.4* 8.7*  MG  --  1.7  PHOS  --  2.6   GFR: CrCl cannot be calculated (Unknown ideal weight.). Liver Function Tests: Recent Labs  Lab 09/30/18 2002 10/01/18 0225  AST 24 28  ALT 15 15  ALKPHOS 77 72  BILITOT 0.5 0.5  PROT 7.7 7.6  ALBUMIN 3.8 3.8   No results for input(s): LIPASE, AMYLASE in the last 168 hours. No results for input(s): AMMONIA in the last 168 hours. Coagulation Profile: No results for input(s): INR, PROTIME in the last 168 hours. Cardiac Enzymes: No results for input(s): CKTOTAL, CKMB, CKMBINDEX, TROPONINI in the last 168 hours. BNP (last 3 results) No results for input(s): PROBNP in the last 8760 hours. HbA1C: No results for input(s): HGBA1C in the last 72 hours. CBG: Recent Labs  Lab 09/30/18 2007  GLUCAP 103*   Lipid Profile: No results for input(s): CHOL, HDL, LDLCALC, TRIG, CHOLHDL, LDLDIRECT in the last 72 hours. Thyroid Function Tests: Recent Labs    10/01/18 0225  TSH 1.107   Anemia Panel: No results for input(s): VITAMINB12, FOLATE, FERRITIN, TIBC, IRON, RETICCTPCT in the last 72 hours.    Radiology Studies: I have reviewed all of the imaging during this hospital visit personally     Scheduled Meds: . FLUoxetine  20  mg Oral Daily  . lisinopril  10 mg Oral Daily   Continuous Infusions: . sodium chloride 20 mL/hr at 09/30/18 2235  . levETIRAcetam 1,500 mg (10/01/18 0507)     LOS: 0 days        Mithra Spano Gerome Apley, MD

## 2018-10-01 NOTE — Evaluation (Signed)
Physical Therapy Evaluation Patient Details Name: [name] MRN: [id number] DOB: [birth year] Today's Date: 10/01/2018   History of Present Illness  Pt is a 58 y/o male admitted secondary to multiple seizures lasting 20-30 minutes. PMH including but not limited to TBI, seizure disorder, CKD and HTN.    Clinical Impression  Pt presented supine in bed with HOB elevated, awake and willing to participate in therapy session. Prior to admission, pt reported that he was independent with functional mobility. He did report that he lives with his brother. However, pt spoke very little [place] and there is no interpreter available for [name] at this time. Therefore, limited history and home environment information obtained. At the time of evaluation, pt required min A for bed mobility, min A x2 for transfers and min A x2 for hallway ambulation without an AD. Pt very unsafe and impulsive throughout. Pt would continue to benefit from skilled physical therapy services at this time while admitted and after d/c to address the below listed limitations in order to improve overall safety and independence with functional mobility.     Follow Up Recommendations Supervision/Assistance - 24 hour;SNF    Equipment Recommendations  None recommended by PT    Recommendations for Other Services       Precautions / Restrictions Precautions Precautions: Fall;Other (comment)(seizure) Restrictions Weight Bearing Restrictions: No      Mobility  Bed Mobility Overal bed mobility: Needs Assistance Bed Mobility: Supine to Sit;Sit to Supine     Supine to sit: Min assist Sit to supine: Min assist   General bed mobility comments: increased time and effort, assistance needed for trunk elevation and for bilateral LEs to return to bed; cueing needed for technique as pt initially attempting to sit up in a straddling position on the bed  Transfers Overall transfer level: Needs assistance Equipment used: 1 person  hand held assist Transfers: Sit to/from Stand Sit to Stand: +2 safety/equipment;Min assist         General transfer comment: pt impulsive with transfers, assistance needed for safety and stability with transitional movement  Ambulation/Gait Ambulation/Gait assistance: +2 safety/equipment;+2 physical assistance;Min assist Gait Distance (Feet): 200 Feet Assistive device: 1 person hand held assist;2 person hand held assist Gait Pattern/deviations: Step-through pattern;Decreased stride length Gait velocity: variable   General Gait Details: pt with modest instability initially; with a very wide BoS and minimal to zero knee flexion during swing phase bilaterally. With practice and increased speed, pt with improved stability but remained unsafe and impulsive  Stairs            Wheelchair Mobility    Modified Rankin (Stroke Patients Only)       Balance Overall balance assessment: Needs assistance Sitting-balance support: Feet supported Sitting balance-Leahy Scale: Fair Sitting balance - Comments: close min guard for safety   Standing balance support: During functional activity;Single extremity supported;Bilateral upper extremity supported Standing balance-Leahy Scale: Poor                               Pertinent Vitals/Pain Pain Assessment: No/denies pain    Home Living Family/patient expects to be discharged to:: Private residence Living Arrangements: Other relatives(lives with brother)               Additional Comments: unable to obtain complete information secondary to language barrier (no interpreter for [employer] available) and pt with very little English    Prior Function Level of Independence: Independent  Hand Dominance        Extremity/Trunk Assessment   Upper Extremity Assessment Upper Extremity Assessment: Difficult to assess due to impaired cognition;Defer to OT evaluation    Lower Extremity  Assessment Lower Extremity Assessment: Difficult to assess due to impaired cognition;Overall WFL for tasks assessed       Communication   Communication: Prefers language other than [place]  Cognition Arousal/Alertness: Awake/alert Behavior During Therapy: Impulsive;Restless Overall Cognitive Status: Impaired/Different from baseline Area of Impairment: Following commands;Safety/judgement;Awareness;Problem solving                       Following Commands: Follows one step commands inconsistently;Follows one step commands with increased time Safety/Judgement: Decreased awareness of deficits;Decreased awareness of safety Awareness: Intellectual Problem Solving: Difficulty sequencing;Requires verbal cues;Requires tactile cues        General Comments      Exercises     Assessment/Plan    PT Assessment Patient needs continued PT services  PT Problem List Decreased balance;Decreased mobility;Decreased coordination;Decreased cognition;Decreased safety awareness;Decreased knowledge of precautions       PT Treatment Interventions DME instruction;Stair training;Gait training;Functional mobility training;Therapeutic activities;Therapeutic exercise;Balance training;Neuromuscular re-education;Cognitive remediation;Patient/family education    PT Goals (Current goals can be found in the Care Plan section)  Acute Rehab PT Goals Patient Stated Goal: expressed wanting to go to the bathroom PT Goal Formulation: Patient unable to participate in goal setting Time For Goal Achievement: 10/15/18 Potential to Achieve Goals: Good    Frequency Min 2X/week   Barriers to discharge        Co-evaluation PT/OT/SLP Co-Evaluation/Treatment: Yes Reason for Co-Treatment: Necessary to address cognition/behavior during functional activity;For patient/therapist safety;To address functional/ADL transfers PT goals addressed during session: Mobility/safety with mobility;Balance;Strengthening/ROM          AM-PAC PT "6 Clicks" Mobility  Outcome Measure Help needed turning from your back to your side while in a flat bed without using bedrails?: A Little Help needed moving from lying on your back to sitting on the side of a flat bed without using bedrails?: A Little Help needed moving to and from a bed to a chair (including a wheelchair)?: A Little Help needed standing up from a chair using your arms (e.g., wheelchair or bedside chair)?: A Little Help needed to walk in hospital room?: A Little Help needed climbing 3-5 steps with a railing? : A Lot 6 Click Score: 17    End of Session Equipment Utilized During Treatment: Gait belt Activity Tolerance: Patient tolerated treatment well Patient left: in bed;with call bell/phone within reach;with bed alarm set;with SCD's reapplied;Other (comment)(seizure precautions) Nurse Communication: Mobility status PT Visit Diagnosis: Other abnormalities of gait and mobility (R26.89);Other symptoms and signs involving the nervous system (R29.898)    Time: 5498-2641 PT Time Calculation (min) (ACUTE ONLY): 23 min   Charges:   PT Evaluation $PT Eval Moderate Complexity: 1 Mod          Sherie Don, PT, DPT  Acute Rehabilitation Services Pager [phone] Office [place] 10/01/2018, 11:51 AM

## 2018-10-01 NOTE — Evaluation (Signed)
Clinical/Bedside Swallow Evaluation Patient Details  Name: [name] MRN: [id number] Date of Birth: 26-May-1960  Today's Date: 10/01/2018 Time: SLP Start Time (ACUTE ONLY): 0831 SLP Stop Time (ACUTE ONLY): 0852 SLP Time Calculation (min) (ACUTE ONLY): 21 min  Past Medical History:  Past Medical History:  Diagnosis Date  . Anxiety   . Back pain    low back pain/ compressed disk in lower back.   . CKD (chronic kidney disease), stage II   . Depression   . Hypertension   . Language barrier 03-01-13   Speaks very little English,primary-[place]namese "[name] Dialect"  . Seizures ([place])    due head trauma-seizures are less, but occurs from mild to more severe, which causes agitation to mental state.  [name] TBI (traumatic brain injury) ([place])    CARE FOR BY BROTHER   Past Surgical History:  Past Surgical History:  Procedure Laterality Date  . ABDOMINAL SURGERY    . COLONOSCOPY N/A 03/17/2013   Procedure: COLONOSCOPY;  Surgeon: [name], MD;  Location: [facility] ENDOSCOPY;  Service: Endoscopy;  Laterality: N/A;  . NO PAST SURGERIES     ? one surgery in [place]-? what, has abdominal scar.   HPI:  58 yo male adm to [facility] with seizure.  PMH + for TBI, depression, HTN, seizure d/o.  Swallow eval ordered.   Assessment / Plan / Recommendation Clinical Impression  Patient presents with functional oropharyngeal swallow ability based on clinical swallow evaluation.  Pt is distracted but able to focus on feeding himself his meal - no oral pocketing nor indications of airway compromise.  3 ounce Yale test unable to be conducted due to language barrier - and [name] was NOT available on IPAD interpreter.  No SlP follow up indicated.  Pt reports he lives with his brother as he understands some English and able to communicate some needs. SLP Visit Diagnosis: Dysphagia, unspecified (R13.10)    Aspiration Risk  No limitations    Diet Recommendation Regular;Thin liquid   Liquid Administration via:  Cup;Straw Medication Administration: Whole meds with liquid Supervision: Patient able to self feed Compensations: Minimize environmental distractions;Slow rate;Small sips/bites Postural Changes: Remain upright for at least 30 minutes after po intake;Seated upright at 90 degrees    Other  Recommendations Oral Care Recommendations: Oral care BID   Follow up Recommendations None      Frequency and Duration      n/a      Prognosis   n/a     Swallow Study   General Date of Onset: 10/01/18 HPI: 58 yo male adm to [facility] with seizure.  PMH + for TBI, depression, HTN, seizure d/o.  Swallow eval ordered. Diet Prior to this Study: Regular;Thin liquids Temperature Spikes Noted: No Respiratory Status: Room air History of Recent Intubation: No Behavior/Cognition: Alert;Cooperative;Distractible Oral Cavity Assessment: Within Functional Limits Oral Care Completed by SLP: No Oral Cavity - Dentition: Adequate natural dentition Vision: Functional for self-feeding Self-Feeding Abilities: Able to feed self Patient Positioning: Upright in bed Baseline Vocal Quality: Normal Volitional Cough: Cognitively unable to elicit Volitional Swallow: Unable to elicit    Oral/Motor/Sensory Function Overall Oral Motor/Sensory Function: Within functional limits   Ice Chips Ice chips: Not tested   Thin Liquid Thin Liquid: Within functional limits Presentation: Self Fed;Straw    Nectar Thick Nectar Thick Liquid: Not tested   Honey Thick Honey Thick Liquid: Not tested   Puree Puree: Not tested   Solid     Solid: Within functional limits Presentation: Self Fed  [name] 10/01/2018,9:03 AM    Damon Salk, MS [facility] SLP Acute Rehab Services Pager [phone] Office [phone]

## 2018-10-01 NOTE — Evaluation (Signed)
Occupational Therapy Evaluation Patient Details Name: [name] MRN: [id number] DOB: Oct 14, 1960 Today's Date: 10/01/2018    History of Present Illness Pt is a 58 y/o male admitted secondary to multiple seizures lasting 20-30 minutes. PMH including but not limited to TBI, seizure disorder, CKD and HTN.   Clinical Impression   Pt PTA: living with brother per pt and per chart. Pt unable to report PTA information due to language barrier and no way of interpretation available. Pt currently limited by decreased awareness of deficits and decreased safety awareness, poor mobility and decreased strength. Pt requiring increased assist with ADL. Pt ambulating with minA +2. Pt following most commands, but sometimes gesturing did not work. Pt would benefit from continued OT skilled services for ADL, mobility and safety in SNF setting. OT following acutely.      Follow Up Recommendations  SNF;Supervision/Assistance - 24 hour    Equipment Recommendations  Other (comment)(to be determined)    Recommendations for Other Services       Precautions / Restrictions Precautions Precautions: Fall;Other (comment)(seizure) Restrictions Weight Bearing Restrictions: No      Mobility Bed Mobility Overal bed mobility: Needs Assistance Bed Mobility: Supine to Sit;Sit to Supine     Supine to sit: Min assist Sit to supine: Min assist   General bed mobility comments: increased time and effort, assistance needed for trunk elevation and for bilateral LEs to return to bed; cueing needed for technique as pt initially attempting to sit up in a straddling position on the bed  Transfers Overall transfer level: Needs assistance Equipment used: 1 person hand held assist Transfers: Sit to/from Stand Sit to Stand: Min assist;+2 safety/equipment         General transfer comment: pt impulsive with transfers, assistance needed for safety and stability with transitional movement    Balance Overall balance  assessment: Needs assistance Sitting-balance support: Feet supported Sitting balance-Leahy Scale: Fair Sitting balance - Comments: close min guard for safety   Standing balance support: During functional activity;Single extremity supported;Bilateral upper extremity supported Standing balance-Leahy Scale: Poor                             ADL either performed or assessed with clinical judgement   ADL Overall ADL's : Needs assistance/impaired Eating/Feeding: Set up;Sitting Eating/Feeding Details (indicate cue type and reason): required to point out each condiment item. Pt poured sugar on his meat provided and required prompting to stop. Grooming: Min guard;Standing   Upper Body Bathing: Min guard;Sitting   Lower Body Bathing: Minimal assistance;Cueing for safety;Cueing for sequencing   Upper Body Dressing : Min guard;Sitting   Lower Body Dressing: Minimal assistance;Sitting/lateral leans;Sit to/from stand Lower Body Dressing Details (indicate cue type and reason): unable to fully bend without LOB to donn socks Toilet Transfer: Minimal assistance;Regular Toilet;Grab bars   Toileting- Clothing Manipulation and Hygiene: Minimal assistance;+2 for physical assistance;+2 for safety/equipment       Functional mobility during ADLs: Minimal assistance;+2 for physical assistance;+2 for safety/equipment;Cueing for safety;Cueing for sequencing General ADL Comments: set-upA to minA overall. Pt reluctant to perform grooming at sink as pt was fatigued after mobility.     Vision Baseline Vision/History: No visual deficits Vision Assessment?: No apparent visual deficits     Perception     Praxis      Pertinent Vitals/Pain Pain Assessment: No/denies pain     Hand Dominance (unable to discern)   Extremity/Trunk Assessment Upper Extremity Assessment Upper Extremity Assessment: Difficult  to assess due to impaired cognition   Lower Extremity Assessment Lower Extremity  Assessment: Defer to PT evaluation;Difficult to assess due to impaired cognition   Cervical / Trunk Assessment Cervical / Trunk Assessment: Normal   Communication Communication Communication: Prefers language other than English   Cognition Arousal/Alertness: Awake/alert Behavior During Therapy: Impulsive;Restless Overall Cognitive Status: Impaired/Different from baseline Area of Impairment: Following commands;Safety/judgement;Awareness;Problem solving                       Following Commands: Follows one step commands inconsistently;Follows one step commands with increased time Safety/Judgement: Decreased awareness of deficits;Decreased awareness of safety Awareness: Intellectual Problem Solving: Difficulty sequencing;Requires verbal cues;Requires tactile cues     General Comments  Pt able to comprehend some English, but unable to answer all questions. Unable to use interpreter due to no access for correct language from phone or stratus.    Exercises     Shoulder Instructions      Home Living Family/patient expects to be discharged to:: Private residence Living Arrangements: Other relatives(lives with brother)                               Additional Comments: unable to obtain complete information secondary to language barrier (no interpreter for [employer] available) and pt with very little English      Prior Functioning/Environment Level of Independence: Independent                 OT Problem List: Decreased strength;Decreased activity tolerance;Impaired balance (sitting and/or standing);Decreased safety awareness;Decreased coordination;Decreased cognition      OT Treatment/Interventions: Self-care/ADL training;Therapeutic exercise;Neuromuscular education;Energy conservation;Therapeutic activities;Cognitive remediation/compensation;Visual/perceptual remediation/compensation;Patient/family education;Balance training    OT Goals(Current  goals can be found in the care plan section) Acute Rehab OT Goals Patient Stated Goal: expressed wanting to go to the bathroom OT Goal Formulation: With patient Time For Goal Achievement: 10/15/18 Potential to Achieve Goals: Good ADL Goals Pt Will Perform Grooming: with set-up;standing Pt Will Perform Upper Body Dressing: with modified independence;sitting Pt Will Perform Lower Body Dressing: with set-up;sitting/lateral leans;sit to/from stand Pt Will Perform Toileting - Clothing Manipulation and hygiene: with supervision;sitting/lateral leans;sit to/from stand Additional ADL Goal #1: Pt will follow 100% of commands in 3/3 trials with no safety cues.  OT Frequency: Min 2X/week   Barriers to D/C: Other (comment)  Unable to contact pt's brother as number is incorrect in system.       Co-evaluation PT/OT/SLP Co-Evaluation/Treatment: Yes Reason for Co-Treatment: Complexity of the patient's impairments (multi-system involvement);To address functional/ADL transfers   OT goals addressed during session: ADL's and self-care      AM-PAC OT "6 Clicks" Daily Activity     Outcome Measure Help from another person eating meals?: A Little Help from another person taking care of personal grooming?: A Little Help from another person toileting, which includes using toliet, bedpan, or urinal?: A Lot Help from another person bathing (including washing, rinsing, drying)?: A Lot Help from another person to put on and taking off regular upper body clothing?: A Little Help from another person to put on and taking off regular lower body clothing?: A Lot 6 Click Score: 15   End of Session Equipment Utilized During Treatment: Gait belt Nurse Communication: Mobility status  Activity Tolerance: Patient tolerated treatment well Patient left: in bed;with call bell/phone within reach;with bed alarm set  OT Visit Diagnosis: Unsteadiness on feet (R26.81);Muscle weakness (generalized) (M62.81)  Time: 0746-0029 OT Time Calculation (min): 29 min Charges:  OT General Charges $OT Visit: 1 Visit OT Evaluation $OT Eval Moderate Complexity: 1 Mod  Darryl Nestle) Marsa Aris OTR/L Acute Rehabilitation Services Pager: [phone] Office: [place] 10/01/2018, 3:57 PM

## 2018-10-01 NOTE — Progress Notes (Addendum)
Neurology Progress Note  Subjective: Patient is resting comfortably. He was able to understand some English especially when prompting, but unable to hold a conversation due to language barrier. Interpretation device does not have his native language. He was able to follow simple commands (thumbs up, squeeze/let go of hand, raise arms), able to mimic well. Patient does appear sleepy but easily arouses to voice.   Objective:  Current vital signs: BP 124/83 (BP Location: Left Arm)   Pulse 90   Temp 98.6 F (37 C) (Oral)   Resp 18   SpO2 96%    Vital signs in last 24 hours: Temp:  [97.9 F (36.6 C)-99 F (37.2 C)] 98.6 F (37 C) (08/01 0721) Pulse Rate:  [90-112] 90 (08/01 0721) Resp:  [16-27] 18 (08/01 0721) BP: (122-146)/(81-95) 124/83 (08/01 0721) SpO2:  [96 %-100 %] 96 % (08/01 0721)  Neurologic Exam: GEN: NAD, pleasant, cooperative CVS: RRR, no carotid bruit CHEST: No signs of resp distress, on room air ABD: Soft, NTTP  NEURO:  MENTAL STATUS: Asleep, awakens to voice, oriented to name, time, place.  LANG/SPEECH: unreliable d/t language barrier. Speech is quiet/whispered. Some dysarthria noted, unclear if pt exhibits signs of aphasia given that he doesn't speak fluent english.  CRANIAL NERVES:  II: Pupils equal and reactive, no RAPD, normal visual field  III, IV, VI: EOM intact, dysconjugate gaze, no nystagmus  V: normal  VII: no facial asymmetry  VIII: normal hearing to speech  MOTOR: 5/5 in both upper and lower extremities  REFLEXES: 2/4 throughout, bilateral flexor planters  SENSORY: Normal to touch, temperature & pin prick in all extremiteis  COORD: difficult to prompt/assess d/t lack of interpretor  Lab Results: Phenytoin level- 29 (supratherapeautic)  Levetiracetam level- pending  Studies/Results: Ct Head Wo Contrast IMPRESSION: 1. Study degraded by motion artifact and streak artifact as detailed above. 2. Given these limitations, there is no acute intracranial  abnormality detected.   Dg Chest Port 1 View IMPRESSION: No acute cardiopulmonary process.   Medications:  Scheduled: . FLUoxetine  20 mg Oral Daily  . lisinopril  10 mg Oral Daily   Continuous: . sodium chloride 20 mL/hr at 09/30/18 2235  . levETIRAcetam 1,500 mg (10/01/18 0507)   LTJ:[id number] **OR** acetaminophen, ondansetron **OR** ondansetron (ZOFRAN) IV  Assessment: 58 year old man with history of seizures, CKD, HTN, TBI, depression admitted for breakthrough seizures in which he had rapid eye movements and muscle twitching along with phenytoin toxicity.  - On home Dilantin 200 mg BID extended release and Keppra 1500 mg BID - His phenytoin level was elevated on admission 29 (normal 10-20). At this point is not exhibiting any clear signs of phenytoin toxicity other than the derangement of mental status but that could be due to the multiplicity of seizures also.  He has had prior labs with high levels of phenytoin and January 2019.  It might be worthwhile considering an alternative to phenytoin for him in the longer term - His labs reveal some AKI and mild leukocytosis, along with mild hyponatremia.  Recommendations: 1. Maintain seizure precautions 2. Continue home dose Keppra 1500 mg BID 3. Hold phenytoin, primary team to monitor levels (next level to be checked tomorrow), when phenytoin level is below 15, start phenytoin immediate release at a lower dose of 150 mg BID (patient was previously on 200 mg BID extended release at home).  4. Monitor for signs of phenytoin toxicity- ataxia, nystagmus, worsening AMS. 5. EEG pending.   6. Neurology team will sign off at  this time, follow recs as listed above. Please call us if EEG is positive or if there are additional questions.  Posey Pronto PA-C [facility] [phone]  Electronically signed: Dr. Kerney Elbe

## 2018-10-01 NOTE — Progress Notes (Addendum)
CSW attempted to call translations services and they do not service [name]. CSW called [employer] (weekend translation services and they do not service translators for the patient's native language.   CSW attempted to contact the patient's brother to complete the assessment. The patient is only oriented to himself and where he is. The phone number listed for the patient's brother is not currently working.   CSW will continue to follow.   Domenic Schwab, MSW, [place] Worker [facility]  [phone]

## 2018-10-02 LAB — BASIC METABOLIC PANEL
Anion gap: 8 (ref 5–15)
BUN: 8 mg/dL (ref 6–20)
CO2: 30 mmol/L (ref 22–32)
Calcium: 8.7 mg/dL — ABNORMAL LOW (ref 8.9–10.3)
Chloride: 103 mmol/L (ref 98–111)
Creatinine, Ser: 1.18 mg/dL (ref 0.61–1.24)
GFR calc Af Amer: >60 mL/min (ref >60)
GFR calc non Af Amer: >60 mL/min (ref >60)
Glucose, Bld: 99 mg/dL (ref 70–99)
Potassium: 3.8 mmol/L (ref 3.5–5.1)
Sodium: 141 mmol/L (ref 135–145)

## 2018-10-02 LAB — PHENYTOIN LEVEL, TOTAL: Phenytoin Lvl: 21.3 ug/mL — ABNORMAL HIGH (ref 10.0–20.0)

## 2018-10-02 LAB — MAGNESIUM: Magnesium: 2.1 mg/dL (ref 1.7–2.4)

## 2018-10-02 MED ORDER — LEVETIRACETAM 750 MG PO TABS
1500.0000 mg | ORAL_TABLET | Freq: Two times a day (BID) | ORAL | Status: DC
  Administered 2018-10-02 – 2018-10-05 (×6): 1500 mg via ORAL
  Filled 2018-10-02 (×6): qty 2

## 2018-10-02 NOTE — Progress Notes (Signed)
SLP Cancellation Note  Patient Details Name: [name] MRN: [id number] DOB: May 01, 1960   Cancelled treatment:       Reason Eval/Treat Not Completed: Other (comment) Duplicate order received for swallow evaluation. Per MD, no need to reassess. Please refer to evaluation report on 10/01/18 with any questions.    [name] 10/02/2018, 8:39 AM  Pollyann Glen, M.A. [place] Acute Environmental education officer [phone] Office [phone]

## 2018-10-02 NOTE — Progress Notes (Signed)
PROGRESS NOTE    [name]  KGM:[id number] DOB: 1960-12-30 DOA: 09/30/2018 PCP: [name], MD    Brief Narrative:  58 year old male who presented with seizures.  She does have significant past medical history for seizure disorder, chronic kidney disease, hypertension, depression and traumatic brain injury.  She was witnessed to have multiple seizures, generalized, EMS administered 5 mg of Versed IM and 5 intraosseous with patient persistent symptoms of rapid eye movements and muscle twitching.  Apparently patient has been noncompliant with her medications.  On initial physical examination blood pressure 146/94, heart rate 106, temperature 97.9, respiratory rate 16, oxygen saturation 100%.  Patient was awake and alert, her lungs are clear to auscultation bilaterally, heart S1-S2 present and rhythmic, the abdomen was soft.  She had dysarthria and poor attention concentration. Sodium 134, potassium 3.3, chloride 100, bicarb 22, glucose 106, BUN 7, creatinine 1.43, white cell count 11.6, hemoglobin 13.5, hematocrit 39.5, platelets 343.  SARS COVID-19 negative.  Urinalysis 0-5 white cells.  Drug screen positive for benzodiazepines.  Head CT no acute changes.  Chest radiograph negative for infiltrates.  EKG 110 bpm, normal axis, normal intervals, sinus rhythm, J-point elevation V1 to V2, poor R wave progression, no T wave changes.  Patient was admitted to the hospital with a working diagnosis of uncontrolled seizures   Assessment & Plan:   Active Problems:   Encephalopathy   HTN (hypertension)   CKD (chronic kidney disease), stage II   Prolonged seizure ([place])    1. Uncontrolled seizures. Patient is more awake today, following commands and responding to simple questions (language barrier). On tele sitter for safety. On keppra 1500 mg bid that will change to po. Phenytoin level still elevated  At 21, will wait until less than 15 to resume phenytoin, per neurology recommendations. Patient will  need SNF per physical therapy recommendations, follow on EEG.   2. HTN. Continue blood pressure control with lisinopril.  3.  AKI on CKD stage 2, complicated with Hypokalemia and hypomagnsemia. Improved renal function with serum cr at 1,18 with K at 3,8, and serum bicarbonate at 30. Patient is tolerating po well, will hold on IV fluids and will follow on renal panel in am.    4. Depression. On fluoxetine.     DVT prophylaxis: enoxaparin   Code Status:  full Family Communication: no family at the bedside  Disposition Plan/ discharge barriers: pending clinical improvement. SNF   There is no height or weight on file to calculate BMI. Malnutrition Type:      Malnutrition Characteristics:      Nutrition Interventions:     RN Pressure Injury Documentation:     Consultants:   Neurlogy   Procedures:     Antimicrobials:       Subjective: This am patient is more awake and alert, no further agitation or confusion. Responds appropriately to simple questions (language barrier).   Objective: Vitals:   10/01/18 2025 10/02/18 0025 10/02/18 0342 10/02/18 0700  BP: 117/83 129/84 104/71 127/68  Pulse: 88 81 74 79  Resp: 19 19 19 19   Temp: 98.5 F (36.9 C) (!) 97.5 F (36.4 C) (!) 97.4 F (36.3 C) 98.2 F (36.8 C)  TempSrc: Oral Oral Oral Axillary  SpO2: 98% 100% 100% 100%    Intake/Output Summary (Last 24 hours) at 10/02/2018 0843 Last data filed at 10/01/2018 1750 Gross per 24 hour  Intake 653.99 ml  Output 1280 ml  Net -626.01 ml   There were no vitals filed for this  visit.  Examination:   General: deconditioned  Neurology: Awake and alert, non focal  E ENT: no pallor, no icterus, oral mucosa moist Cardiovascular: No JVD. S1-S2 present, rhythmic, no gallops, rubs, or murmurs. No lower extremity edema. Pulmonary: positive breath sounds bilaterally, adequate air movement, no wheezing, rhonchi or rales. Gastrointestinal. Abdomen with, no organomegaly,  non tender, no rebound or guarding Skin. No rashes Musculoskeletal: no joint deformities     Data Reviewed: I have personally reviewed following labs and imaging studies  CBC: Recent Labs  Lab 09/30/18 2002 10/01/18 0225  WBC 11.6* 14.6*  HGB 13.5 13.5  HCT 39.5 40.5  MCV 93.2 93.5  PLT 343 379   Basic Metabolic Panel: Recent Labs  Lab 09/30/18 2002 10/01/18 0225 10/02/18 0628  NA 134* 135 141  K 3.3* 3.7 3.8  CL 100 100 103  CO2 22 25 30   GLUCOSE 106* 117* 99  BUN 7 5* 8  CREATININE 1.43* 1.24 1.18  CALCIUM 8.4* 8.7* 8.7*  MG  --  1.7 2.1  PHOS  --  2.6  --    GFR: CrCl cannot be calculated (Unknown ideal weight.). Liver Function Tests: Recent Labs  Lab 09/30/18 2002 10/01/18 0225  AST 24 28  ALT 15 15  ALKPHOS 77 72  BILITOT 0.5 0.5  PROT 7.7 7.6  ALBUMIN 3.8 3.8   No results for input(s): LIPASE, AMYLASE in the last 168 hours. No results for input(s): AMMONIA in the last 168 hours. Coagulation Profile: No results for input(s): INR, PROTIME in the last 168 hours. Cardiac Enzymes: No results for input(s): CKTOTAL, CKMB, CKMBINDEX, TROPONINI in the last 168 hours. BNP (last 3 results) No results for input(s): PROBNP in the last 8760 hours. HbA1C: No results for input(s): HGBA1C in the last 72 hours. CBG: Recent Labs  Lab 09/30/18 2007  GLUCAP 103*   Lipid Profile: No results for input(s): CHOL, HDL, LDLCALC, TRIG, CHOLHDL, LDLDIRECT in the last 72 hours. Thyroid Function Tests: Recent Labs    10/01/18 0225  TSH 1.107   Anemia Panel: No results for input(s): VITAMINB12, FOLATE, FERRITIN, TIBC, IRON, RETICCTPCT in the last 72 hours.    Radiology Studies: I have reviewed all of the imaging during this hospital visit personally     Scheduled Meds: . FLUoxetine  20 mg Oral Daily  . lisinopril  10 mg Oral Daily   Continuous Infusions: . sodium chloride 20 mL/hr at 09/30/18 2243  . dextrose 5 % and 0.9% NaCl 75 mL/hr at 10/02/18  0834  . levETIRAcetam 1,500 mg (10/01/18 2206)     LOS: 1 day        Kanijah Groseclose Gerome Apley, MD

## 2018-10-03 ENCOUNTER — Inpatient Hospital Stay (HOSPITAL_COMMUNITY): Payer: Medicare Other

## 2018-10-03 LAB — BASIC METABOLIC PANEL
Anion gap: 9 (ref 5–15)
BUN: 10 mg/dL (ref 6–20)
CO2: 24 mmol/L (ref 22–32)
Calcium: 8.7 mg/dL — ABNORMAL LOW (ref 8.9–10.3)
Chloride: 104 mmol/L (ref 98–111)
Creatinine, Ser: 1.06 mg/dL (ref 0.61–1.24)
GFR calc Af Amer: >60 mL/min (ref >60)
GFR calc non Af Amer: >60 mL/min (ref >60)
Glucose, Bld: 165 mg/dL — ABNORMAL HIGH (ref 70–99)
Potassium: 4.6 mmol/L (ref 3.5–5.1)
Sodium: 137 mmol/L (ref 135–145)

## 2018-10-03 LAB — LEVETIRACETAM LEVEL: Levetiracetam Lvl: 48.1 ug/mL — ABNORMAL HIGH (ref 10.0–40.0)

## 2018-10-03 LAB — PHENYTOIN LEVEL, TOTAL: Phenytoin Lvl: 17.4 ug/mL (ref 10.0–20.0)

## 2018-10-03 NOTE — Progress Notes (Signed)
PROGRESS NOTE    Aadit Hagood  WER:[id number] DOB: [birth year] DOA: 09/30/2018 PCP: [name], MD    Brief Narrative:  58 year old male who presented with seizures. She does have significant past medical history for seizure disorder, chronic kidney disease, hypertension, depression and traumatic brain injury. He was witnessed to have multiple seizures, generalized, EMS administered 5 mg of Versed IM and 5 intraosseous with patient persistent symptoms of rapid eye movements and muscle twitching. Apparently patient has been noncompliant with her medications. On initial physical examination blood pressure 146/94, heart rate 106, temperature 97.9, respiratory rate 16, oxygen saturation 100%. Patient was awake and alert, her lungs are clear to auscultation bilaterally, heart S1-S2 present and rhythmic, the abdomen was soft. She had dysarthria and poor attention concentration. Sodium 134, potassium 3.3, chloride 100, bicarb 22, glucose 106, BUN 7, creatinine 1.43, white cell count 11.6, hemoglobin 13.5, hematocrit 39.5, platelets 343.SARS COVID-19 negative. Urinalysis 0-5 white cells. Drug screen positive for benzodiazepines.Head CT no acute changes. Chest radiograph negative for infiltrates. EKG 110 bpm, normal axis, normal intervals, sinus rhythm, J-point elevation V1 to V2, poor R wave progression, no T wave changes.  Patient was admitted to the hospitalwith aworking diagnosis of uncontrolled seizures    Assessment & Plan:   Active Problems:   Encephalopathy   HTN (hypertension)   CKD (chronic kidney disease), stage II   Prolonged seizure ([place])   1. Uncontrolled seizures. Mentation continue to improve today, no further seizure activity, EEG with no active seizures, will continue with keppra 1500 mg bid. Phenytoin level down to 17,5 plan to resume phenytoin once level is less than 15, per neurology recommendations. Possible patient's physical functional capacity has improved,  will have pt re-evaluation for disposition. Check phenytoin level in am.   2. HTN. Blood pressure is 112/74 and 110/74 will continue blood pressure control with lisinopril.  3. AKI on CKD stage 2, complicated with Hypokalemia and hypomagnsemia. Stable renal function with serum cr at 1,0 with K at 4,6 and serum bicarbonate at 24. Patient is tolerating po well. Continue to hold on IV fluids.   4. Depression. Continue with fluoxetine.    DVT prophylaxis:enoxaparin Code Status:full Family Communication:no family at the bedside Disposition Plan/ discharge barriers:pending PT re-evaluation.    There is no height or weight on file to calculate BMI. Malnutrition Type:      Malnutrition Characteristics:      Nutrition Interventions:     RN Pressure Injury Documentation:     Consultants:   Neurology   Procedures:     Antimicrobials:       Subjective: Patient is more awake and alert, no new seizure and tolerating po well, no nausea or vomiting.   Objective: Vitals:   10/03/18 0427 10/03/18 0700 10/03/18 1000 10/03/18 1153  BP: (!) 131/93 113/70 112/74 110/74  Pulse: 79 99  85  Resp: 19 19  19   Temp: 98.2 F (36.8 C) 98.2 F (36.8 C)  99.2 F (37.3 C)  TempSrc: Oral Oral  Oral  SpO2: 98% 95%  96%    Intake/Output Summary (Last 24 hours) at 10/03/2018 1352 Last data filed at 10/03/2018 0800 Gross per 24 hour  Intake 320 ml  Output 540 ml  Net -220 ml   There were no vitals filed for this visit.  Examination:   General: Not in pain or dyspnea  Neurology: Awake and alert, non focal  E ENT: no pallor, no icterus, oral mucosa moist Cardiovascular: No JVD. S1-S2 present, rhythmic, no  gallops, rubs, or murmurs. No lower extremity edema. Pulmonary: vesicular breath sounds bilaterally, adequate air movement, no wheezing, rhonchi or rales. Gastrointestinal. Abdomen with, no organomegaly, non tender, no rebound or guarding Skin. No rashes  Musculoskeletal: no joint deformities     Data Reviewed: I have personally reviewed following labs and imaging studies  CBC: Recent Labs  Lab 09/30/18 2002 10/01/18 0225  WBC 11.6* 14.6*  HGB 13.5 13.5  HCT 39.5 40.5  MCV 93.2 93.5  PLT 343 948   Basic Metabolic Panel: Recent Labs  Lab 09/30/18 2002 10/01/18 0225 10/02/18 0628 10/03/18 0751  NA 134* 135 141 137  K 3.3* 3.7 3.8 4.6  CL 100 100 103 104  CO2 22 25 30 24   GLUCOSE 106* 117* 99 165*  BUN 7 5* 8 10  CREATININE 1.43* 1.24 1.18 1.06  CALCIUM 8.4* 8.7* 8.7* 8.7*  MG  --  1.7 2.1  --   PHOS  --  2.6  --   --    GFR: CrCl cannot be calculated (Unknown ideal weight.). Liver Function Tests: Recent Labs  Lab 09/30/18 2002 10/01/18 0225  AST 24 28  ALT 15 15  ALKPHOS 77 72  BILITOT 0.5 0.5  PROT 7.7 7.6  ALBUMIN 3.8 3.8   No results for input(s): LIPASE, AMYLASE in the last 168 hours. No results for input(s): AMMONIA in the last 168 hours. Coagulation Profile: No results for input(s): INR, PROTIME in the last 168 hours. Cardiac Enzymes: No results for input(s): CKTOTAL, CKMB, CKMBINDEX, TROPONINI in the last 168 hours. BNP (last 3 results) No results for input(s): PROBNP in the last 8760 hours. HbA1C: No results for input(s): HGBA1C in the last 72 hours. CBG: Recent Labs  Lab 09/30/18 2007  GLUCAP 103*   Lipid Profile: No results for input(s): CHOL, HDL, LDLCALC, TRIG, CHOLHDL, LDLDIRECT in the last 72 hours. Thyroid Function Tests: Recent Labs    10/01/18 0225  TSH 1.107   Anemia Panel: No results for input(s): VITAMINB12, FOLATE, FERRITIN, TIBC, IRON, RETICCTPCT in the last 72 hours.    Radiology Studies: I have reviewed all of the imaging during this hospital visit personally     Scheduled Meds: . FLUoxetine  20 mg Oral Daily  . levETIRAcetam  1,500 mg Oral BID  . lisinopril  10 mg Oral Daily   Continuous Infusions: . sodium chloride 20 mL/hr at 09/30/18 2243     LOS:  2 days        Pete Schnitzer Gerome Apley, MD

## 2018-10-03 NOTE — Procedures (Signed)
Patient Name: Pieter Fooks  MRN: [id number]  Epilepsy Attending: Lora Havens  Referring Physician/Provider: Dr Toy Baker Date: 10/03/18 Duration: 25.43 mins  Patient history: 58 year old man with history of seizures presenting with breakthrough seizures. EEG ordered to evaluate for seizures.   Level of alertness: awake and asleep  Technical aspects: This EEG study was done with scalp electrodes positioned according to the 10-20 International system of electrode placement. Electrical activity was acquired at a sampling rate of 500Hz  and reviewed with a high frequency filter of 70Hz  and a low frequency filter of 1Hz . EEG data were recorded continuously and digitally stored.   BACKGROUND ACTIVITY: Posterior dominant rhythm: The posterior dominant rhythm consists of 7-8 Hz activity of moderate voltage (25-35 uV) seen predominantly in posterior head regions, symmetric and reactive to eye opening and eye closing.          Slowing: Intermittent 2-4hz  delta slowing in left temporal region  EPILEPTIFORM ACTIVITY: Interictal epileptiform activity: Sharp wave in left frontotemporal region, at time rhythmic with no evolution, frequent in sleep  SLEEP RECORDINGS:  Vertex waves were seen.   ACTIVATION PROCEDURES:  Hyperventilation and photic stimulation were not performed.  IMPRESSION: This study is suggestive of potential epileptogenicity in left frontotemporal region as well as cortical dysfunction in the same region. No seizures were seen throughout the recording.   Honor Fairbank Barbra Sarks

## 2018-10-03 NOTE — Progress Notes (Signed)
EEG complete - results pending 

## 2018-10-03 NOTE — Plan of Care (Signed)
  Problem: Nutrition: Goal: Adequate nutrition will be maintained Outcome: Progressing   Problem: Coping: Goal: Level of anxiety will decrease Outcome: Progressing   

## 2018-10-04 LAB — PHENYTOIN LEVEL, TOTAL: Phenytoin Lvl: 14.1 ug/mL (ref 10.0–20.0)

## 2018-10-04 MED ORDER — PHENYTOIN 50 MG PO CHEW: 150.0000 mg | CHEWABLE_TABLET | Freq: Two times a day (BID) | ORAL | 0 refills | Status: DC

## 2018-10-04 MED ORDER — PHENYTOIN 50 MG PO CHEW
150.0000 mg | CHEWABLE_TABLET | Freq: Two times a day (BID) | ORAL | Status: DC
  Administered 2018-10-04 – 2018-10-05 (×2): 150 mg via ORAL
  Filled 2018-10-04 (×3): qty 3

## 2018-10-04 MED ORDER — LEVETIRACETAM 750 MG PO TABS: 1500.0000 mg | ORAL_TABLET | Freq: Two times a day (BID) | ORAL | 0 refills | Status: DC

## 2018-10-04 NOTE — Progress Notes (Signed)
Physical Therapy Treatment Patient Details Name: [name] MRN: [id number] DOB: 28-Mar-1960 Today's Date: 10/04/2018    History of Present Illness Pt is a 58 y/o male admitted secondary to multiple seizures lasting 20-30 minutes. PMH including but not limited to TBI, seizure disorder, CKD and HTN.    PT Comments    Pt progressing towards goals, however, remains impulsive throughout mobility. Pt requiring constant hands on assist for balance and for safe use of DME. Requiring min to mod A +2 for safety with use of RW. Feel SNF recommendations remain appropriate. However, if pt close to baseline and family able to provide necessary hands on assist, pt would likely be ok to d/c home with [facility] services. Will continue to follow acutely to maximize functional mobility independence and safety.     Follow Up Recommendations  Supervision/Assistance - 24 hour;SNF(unless pt close to baseline and family able to provide assist)     Equipment Recommendations  Conservation officer, nature    Recommendations for Other Services       Precautions / Restrictions Precautions Precautions: Fall;Other (comment) Precaution Comments: seiazure; no translator for dialect available to be present Restrictions Weight Bearing Restrictions: No    Mobility  Bed Mobility Overal bed mobility: Needs Assistance Bed Mobility: Supine to Sit;Sit to Supine     Supine to sit: Supervision Sit to supine: Supervision   General bed mobility comments: Supervision for safety and line management.   Transfers Overall transfer level: Needs assistance Equipment used: Rolling walker (2 wheeled);2 person hand held assist Transfers: Sit to/from Stand Sit to Stand: Min assist;+2 safety/equipment         General transfer comment: Pt impulsive with transfers. Requiring min A +2 for safety and balance.   Ambulation/Gait Ambulation/Gait assistance: Min assist;Mod assist;+2 safety/equipment Gait Distance (Feet): 150 Feet Assistive device:  Rolling walker (2 wheeled) Gait Pattern/deviations: Step-through pattern;Decreased stride length;Trunk flexed Gait velocity: Decreased   General Gait Details: Unsteady gait. Required hands on physical assist at all times secondary to impulsivity and unsteadiness. Pt required manual assist for safe use of RW and had decreased proximity to device throughout.    Stairs             Wheelchair Mobility    Modified Rankin (Stroke Patients Only)       Balance Overall balance assessment: Needs assistance Sitting-balance support: Feet supported Sitting balance-Leahy Scale: Fair     Standing balance support: Bilateral upper extremity supported;During functional activity Standing balance-Leahy Scale: Poor Standing balance comment: Reliant on UE and external support                             Cognition Arousal/Alertness: Awake/alert Behavior During Therapy: Impulsive Overall Cognitive Status: Impaired/Different from baseline Area of Impairment: Following commands;Safety/judgement;Awareness;Problem solving                       Following Commands: Follows one step commands inconsistently;Follows one step commands with increased time Safety/Judgement: Decreased awareness of deficits;Decreased awareness of safety Awareness: Intellectual Problem Solving: Difficulty sequencing;Requires verbal cues;Requires tactile cues General Comments: Difficulty to formally assess cognition secondary to language barrier. Pt did seem to understand basic english such as walk and bathroom. No interpreter available in needed dialect.       Exercises      General Comments        Pertinent Vitals/Pain Pain Assessment: Faces Faces Pain Scale: No hurt    Home Living  Prior Function            PT Goals (current goals can now be found in the care plan section) Acute Rehab PT Goals Patient Stated Goal: expressed wanting to go to the  bathroom PT Goal Formulation: Patient unable to participate in goal setting Time For Goal Achievement: 10/15/18 Potential to Achieve Goals: Good Progress towards PT goals: Progressing toward goals    Frequency    Min 2X/week      PT Plan Current plan remains appropriate    Co-evaluation PT/OT/SLP Co-Evaluation/Treatment: Yes Reason for Co-Treatment: Complexity of the patient's impairments (multi-system involvement);Necessary to address cognition/behavior during functional activity;For patient/therapist safety PT goals addressed during session: Mobility/safety with mobility;Balance;Proper use of DME OT goals addressed during session: ADL's and self-care      AM-PAC PT "6 Clicks" Mobility   Outcome Measure  Help needed turning from your back to your side while in a flat bed without using bedrails?: None Help needed moving from lying on your back to sitting on the side of a flat bed without using bedrails?: None Help needed moving to and from a bed to a chair (including a wheelchair)?: A Little Help needed standing up from a chair using your arms (e.g., wheelchair or bedside chair)?: A Little Help needed to walk in hospital room?: A Lot Help needed climbing 3-5 steps with a railing? : A Lot 6 Click Score: 18    End of Session Equipment Utilized During Treatment: Gait belt Activity Tolerance: Patient tolerated treatment well Patient left: in bed;with call bell/phone within reach;with bed alarm set;with SCD's reapplied Nurse Communication: Mobility status PT Visit Diagnosis: Other abnormalities of gait and mobility (R26.89);Other symptoms and signs involving the nervous system (R29.898)     Time: 6237-6283 PT Time Calculation (min) (ACUTE ONLY): 35 min  Charges:  $Gait Training: 8-22 mins                     Leighton Ruff, PT, DPT  Acute Rehabilitation Services  Pager: [phone] Office: [phone]    Rudean Hitt 10/04/2018, 2:40 PM

## 2018-10-04 NOTE — Care Management Important Message (Signed)
Important Message  Patient Details  Name: [name] MRN: [id number] Date of Birth: [birth year]   Medicare Important Message Given:  Yes     Katya Rolston 10/04/2018, 12:40 PM

## 2018-10-04 NOTE — Progress Notes (Signed)
Occupational Therapy Treatment Patient Details Name: [name] MRN: [id number] DOB: [birth year] Today's Date: 10/04/2018    History of present illness Pt is a 58 y/o male admitted secondary to multiple seizures lasting 20-30 minutes. PMH including but not limited to TBI, seizure disorder, CKD and HTN.   OT comments  Pt progressing toward stated goals, focused session on BADL progressing and cognition. Pt does not have interpreter available in his dialect, spokee with social work who is actively arranging for outside remote translator to gather more information. Spoke with Dr. Cathlean Sauer who wants pt to be d/c home if possible, seen this date for appropriateness of this request. Unsure of pt baseline, but does live with brother and brother will need to be able to provide 24/7 physical assist. Pt is supervision for bed mobility and min A +2 for functional transfers with RW due to balance and safety 2/2 impulsivity. Pt transferred to [facility] at min A +2 to have BM, set up for peri care. Pt then completed household functional mobility in the hall with RW at min A for safety and balance. Continues to be difficult to fully assess for baseline and cognition due to language barrier. Collateral from brother would be beneficial, will continue to follow for this per social work ability to Designer, multimedia and ability to reach brother. Will continue to follow. Continue to recommend SNF until brother can confirm ability to provide 24/7 care.   Follow Up Recommendations  SNF;Supervision/Assistance - 24 hour;Other (comment)(unless family able to provide 24/7 physical assist)    Equipment Recommendations  3 in 1 bedside commode    Recommendations for Other Services      Precautions / Restrictions Precautions Precautions: Fall;Other (comment) Precaution Comments: seizure; no translator for dialect available to be present Restrictions Weight Bearing Restrictions: No       Mobility Bed Mobility Overal bed  mobility: Needs Assistance Bed Mobility: Supine to Sit;Sit to Supine     Supine to sit: Supervision Sit to supine: Supervision   General bed mobility comments: Supervision for safety and line management.   Transfers Overall transfer level: Needs assistance Equipment used: Rolling walker (2 wheeled);2 person hand held assist Transfers: Sit to/from Stand Sit to Stand: Min assist;+2 safety/equipment         General transfer comment: impulsive, needing +2 for safety and balance    Balance Overall balance assessment: Needs assistance Sitting-balance support: Feet supported Sitting balance-Leahy Scale: Fair Sitting balance - Comments: close min guard for safety   Standing balance support: Bilateral upper extremity supported;During functional activity Standing balance-Leahy Scale: Poor Standing balance comment: reliant on BUE external support                           ADL either performed or assessed with clinical judgement   ADL Overall ADL's : Needs assistance/impaired                     Lower Body Dressing: Minimal assistance;Sitting/lateral leans;Sit to/from stand Lower Body Dressing Details (indicate cue type and reason): unable to fully bend without LOB to donn socks; doffed socks at start of session despite cueing not to Toilet Transfer: Minimal assistance;BSC;RW   Toileting- Clothing Manipulation and Hygiene: Moderate assistance;Sit to/from stand;Sitting/lateral lean       Functional mobility during ADLs: Minimal assistance;+2 for physical assistance;+2 for safety/equipment;Cueing for safety;Cueing for sequencing General ADL Comments: communication barriers present impacting full picture of BADL     Vision  Vision Assessment?: No apparent visual deficits   Perception     Praxis      Cognition Arousal/Alertness: Awake/alert Behavior During Therapy: Impulsive Overall Cognitive Status: Impaired/Different from baseline Area of Impairment:  Following commands;Safety/judgement;Awareness;Problem solving                       Following Commands: Follows one step commands inconsistently;Follows one step commands with increased time Safety/Judgement: Decreased awareness of deficits;Decreased awareness of safety Awareness: Intellectual Problem Solving: Difficulty sequencing;Requires verbal cues;Requires tactile cues General Comments: difficult to fully assess due to language barrier, pt able to stand basic english such as BR, walk, drink. Overall pt is impulsive and needs close hands on assist for safety        Exercises     Shoulder Instructions       General Comments      Pertinent Vitals/ Pain       Pain Assessment: Faces Faces Pain Scale: No hurt  Home Living                                          Prior Functioning/Environment              Frequency  Min 2X/week        Progress Toward Goals  OT Goals(current goals can now be found in the care plan section)  Progress towards OT goals: Progressing toward goals  Acute Rehab OT Goals Patient Stated Goal: expressed wanting to go to the bathroom OT Goal Formulation: With patient Time For Goal Achievement: 10/15/18 Potential to Achieve Goals: Good  Plan Discharge plan needs to be updated;Frequency remains appropriate    Co-evaluation    PT/OT/SLP Co-Evaluation/Treatment: Yes Reason for Co-Treatment: Complexity of the patient's impairments (multi-system involvement);Necessary to address cognition/behavior during functional activity;For patient/therapist safety PT goals addressed during session: Mobility/safety with mobility;Balance;Proper use of DME OT goals addressed during session: ADL's and self-care      AM-PAC OT "6 Clicks" Daily Activity     Outcome Measure   Help from another person eating meals?: A Little Help from another person taking care of personal grooming?: A Little Help from another person toileting,  which includes using toliet, bedpan, or urinal?: A Lot Help from another person bathing (including washing, rinsing, drying)?: A Lot Help from another person to put on and taking off regular upper body clothing?: A Little Help from another person to put on and taking off regular lower body clothing?: A Little 6 Click Score: 16    End of Session Equipment Utilized During Treatment: Gait belt;Rolling walker  OT Visit Diagnosis: Unsteadiness on feet (R26.81);Muscle weakness (generalized) (M62.81);Other symptoms and signs involving cognitive function   Activity Tolerance Patient tolerated treatment well   Patient Left in bed;with call bell/phone within reach;with bed alarm set   Nurse Communication Mobility status        Time: 4920-1007 OT Time Calculation (min): 35 min  Charges: OT General Charges $OT Visit: 1 Visit OT Treatments $Self Care/Home Management : 8-22 mins  Zenovia Jarred, MSOT, OTR/L [facility] Office: [phone]  Zenovia Jarred 10/04/2018, 3:33 PM

## 2018-10-04 NOTE — Progress Notes (Addendum)
Morning lab top be cleolcted by phlebotomy, on-coming  follow-up with pnurse to be informed of need tohlebotomy. Multiple phone call attempts to phlebotomy from 0600-0640. Correction, this lab collected by phlebotomy this shift.

## 2018-10-04 NOTE — Plan of Care (Signed)
  Problem: Education: Goal: Knowledge of General Education information will improve Description: Including pain rating scale, medication(s)/side effects and non-pharmacologic comfort measures Outcome: Progressing   Problem: Health Behavior/Discharge Planning: Goal: Ability to manage health-related needs will improve Outcome: Progressing   Problem: Clinical Measurements: Goal: Ability to maintain clinical measurements within normal limits will improve Outcome: Progressing Goal: Will remain free from infection Outcome: Progressing Goal: Diagnostic test results will improve Outcome: Progressing Goal: Respiratory complications will improve Outcome: Progressing Goal: Cardiovascular complication will be avoided Outcome: Progressing   Problem: Activity: Goal: Risk for activity intolerance will decrease Outcome: Progressing   Problem: Nutrition: Goal: Adequate nutrition will be maintained Outcome: Progressing   Problem: Coping: Goal: Level of anxiety will decrease Outcome: Progressing   Problem: Elimination: Goal: Will not experience complications related to bowel motility Outcome: Progressing Goal: Will not experience complications related to urinary retention Outcome: Progressing   Problem: Pain Managment: Goal: General experience of comfort will improve Outcome: Progressing   Problem: Safety: Goal: Ability to remain free from injury will improve Outcome: Progressing   Problem: Skin Integrity: Goal: Risk for impaired skin integrity will decrease Outcome: Progressing   Problem: Education: Goal: Expressions of having a comfortable level of knowledge regarding the disease process will increase Outcome: Progressing   Problem: Coping: Goal: Ability to adjust to condition or change in health will improve Outcome: Progressing Goal: Ability to identify appropriate support needs will improve Outcome: Progressing   Problem: Health Behavior/Discharge Planning: Goal:  Compliance with prescribed medication regimen will improve Outcome: Progressing   Problem: Medication: Goal: Risk for medication side effects will decrease Outcome: Progressing   Problem: Clinical Measurements: Goal: Complications related to the disease process, condition or treatment will be avoided or minimized Outcome: Progressing Goal: Diagnostic test results will improve Outcome: Progressing   Problem: Safety: Goal: Verbalization of understanding the information provided will improve Outcome: Progressing   Problem: Self-Concept: Goal: Level of anxiety will decrease Outcome: Progressing Goal: Ability to verbalize feelings about condition will improve Outcome: Progressing   Ival Bible, BSN, RN

## 2018-10-04 NOTE — Progress Notes (Signed)
Assisted patient with urinal, patient back in bed with bed alarm on

## 2018-10-04 NOTE — Discharge Summary (Addendum)
Physician Discharge Summary  [name] GBT:[id number] DOB: 1961/01/23 DOA: 09/30/2018  PCP: [name], MD  Admit date: 09/30/2018 Discharge date: 10/04/2018  Admitted From: Home  Disposition:  SNF  Recommendations for Outpatient Follow-up and new medication changes:  1. Follow up with Dr. Jeanie Cooks in 7 days.  2. Patient has been resumed on Keppra 1,500 mg bid. 3. Continue with phenytoin at 150 mg po bid  Home Health: No   Equipment/Devices: No    Discharge Condition: stable  CODE STATUS: full  Diet recommendation: Regular diet  Brief/Interim Summary: 58 year old male who presented with seizures.  He does have significant past medical history for seizure disorder, chronic kidney disease, hypertension, depression and traumatic brain injury. He was witnessed to have multiple seizures, generalized, EMS administered 5 mg of Versed IM and 5 intraosseous with patient having persistent symptoms of rapid eye movements with muscle twitching. Apparently patient has been noncompliant with his medications. On his initial physical examination blood pressure 146/94, heart rate 106, temperature 97.9, respiratory rate 16, oxygen saturation 100%. Patient was awake and alert, his lungs were clear to auscultation bilaterally, heart S1-S2 present and rhythmic, the abdomen was soft. He had dysarthria and poor attention/ concentration. Sodium 134, potassium 3.3, chloride 100, bicarb 22, glucose 106, BUN 7, creatinine 1.43, white cell count 11.6, hemoglobin 13.5, hematocrit 39.5, platelets 343.SARS COVID-19 negative. Urinalysis 0-5 white cells. Drug screen positive for benzodiazepines.Head CT no acute changes. Chest radiograph negative for infiltrates. EKG 110 bpm, normal axis, normal intervals, sinus rhythm, J-point elevation V1 to V2, poor R wave progression, no T wave changes.  Patient was admitted to the hospitalwith aworking diagnosis of uncontrolled seizures  1.  Uncontrolled seizures.   Patient was admitted to the progressive care unit, he had frequent neuro checks, aspiration precautions and telemetry monitoring.  He was resumed on Keppra 1,500 mg twice daily.  Electroencephalography showed potential epileptogenic focus in left frontotemporal region as well as cortical dysfunction, no active seizures during the recording.  Neurology was consulted with recommendations to resume phenytoin at the lower dose of 150 minutes twice daily after phenytoin level less than 15.  Clinically he has been improving, he is more awake and alert, communication is limited due to language barriers.  2.  Hypertension.  Patient not taking any antihypertensive agents at home, his blood pressure during his hospitalization remained stable, 121/76, 111/71.  Follow-up as an outpatient.  3.  Acute kidney injury on chronic kidney disease stage II complicated by hypokalemia and hypomagnesemia.  Patient received IV fluids with good toleration, his electrolytes were corrected, at discharge his creatinine is 1.0, sodium 137, potassium 4.6, magnesium 2.1 and bicarbonate of 24.  4. Depression. In the past patient has been on fluoxetine, but has been not compliant.   Discharge Diagnoses:  Active Problems:   Encephalopathy   HTN (hypertension)   CKD (chronic kidney disease), stage II   Prolonged seizure [facility])    Discharge Instructions   Allergies as of 10/04/2018   No Known Allergies     Medication List    STOP taking these medications   phenytoin 200 MG ER capsule Commonly known as: DILANTIN     TAKE these medications   levETIRAcetam 750 MG tablet Commonly known as: KEPPRA Take 2 tablets (1,500 mg total) by mouth 2 (two) times daily. What changed:   medication strength  how much to take  how to take this  when to take this  additional instructions   phenytoin 50 MG tablet Commonly  known as: DILANTIN Chew 3 tablets (150 mg total) by mouth 2 (two) times daily.       No Known  Allergies  Consultations:  Neurology    Procedures/Studies: Ct Head Wo Contrast  Result Date: 09/30/2018 CLINICAL DATA:  Seizure activity EXAM: CT HEAD WITHOUT CONTRAST TECHNIQUE: Contiguous axial images were obtained from the base of the skull through the vertex without intravenous contrast. COMPARISON:  March 27, 2016. FINDINGS: Brain: No evidence of acute infarction, hemorrhage, hydrocephalus, extra-axial collection or mass lesion/mass effect. Chronic microvascular ischemic changes and an old left basal ganglia lacunar infarct are again noted. The posterior fossa is poorly evaluated secondary to extensive streak artifact from a metallic structure along the patient's right lateral aspect. Examination was limited by motion artifact. Vascular: No hyperdense vessel or unexpected calcification. Skull: Normal. Negative for fracture or focal lesion. Sinuses/Orbits: There is mild mucosal thickening of the bilateral maxillary sinuses and ethmoid air cells, otherwise the remaining paranasal sinuses and mastoid air cells are essentially clear. Other: None. IMPRESSION: 1. Study degraded by motion artifact and streak artifact as detailed above. 2. Given these limitations, there is no acute intracranial abnormality detected. Electronically Signed   By: Constance Holster M.D.   On: 09/30/2018 22:01   Dg Chest Port 1 View  Result Date: 09/30/2018 CLINICAL DATA:  Multiple seizures EXAM: PORTABLE CHEST 1 VIEW COMPARISON:  Jul 03, 2015 FINDINGS: The patient is slightly rotated, however the cardiomediastinal silhouette appears to be unremarkable. Large airspace consolidation or pleural effusion. No acute osseous abnormality. IMPRESSION: No acute cardiopulmonary process. Electronically Signed   By: Prudencio Pair M.D.   On: 09/30/2018 23:04      Procedures:   Subjective: Patient is feeling well, no nausea or vomiting, no chest pain or dyspnea, has been tolerating po well.   Discharge Exam: Vitals:   10/04/18  0900 10/04/18 1100  BP: 121/76 111/71  Pulse: 90 90  Resp: 19 19  Temp: 98.4 F (36.9 C) 97.9 F (36.6 C)  SpO2: 98% 97%   Vitals:   10/04/18 0005 10/04/18 0450 10/04/18 0900 10/04/18 1100  BP: 130/74 120/71 121/76 111/71  Pulse: 80 87 90 90  Resp: 19 19 19 19   Temp: 98.7 F (37.1 C) 98.4 F (36.9 C) 98.4 F (36.9 C) 97.9 F (36.6 C)  TempSrc: Oral Oral Oral Oral  SpO2: 97% 98% 98% 97%    General: Not in pain or dyspnea  Neurology: Awake and alert, non focal  E ENT: no pallor, no icterus, oral mucosa moist Cardiovascular: No JVD. S1-S2 present, rhythmic, no gallops, rubs, or murmurs. No lower extremity edema. Pulmonary: positive breath sounds bilaterally, adequate air movement, no wheezing, rhonchi or rales. Gastrointestinal. Abdomen with no organomegaly, non tender, no rebound or guarding Skin. No rashes Musculoskeletal: no joint deformities   The results of significant diagnostics from this hospitalization (including imaging, microbiology, ancillary and laboratory) are listed below for reference.     Microbiology: Recent Results (from the past 240 hour(s))  SARS Coronavirus 2 [facility] order, Performed in [facility] hospital lab) Nasopharyngeal Nasopharyngeal Swab     Status: None   Collection Time: 09/30/18  8:20 PM   Specimen: Nasopharyngeal Swab  Result Value Ref Range Status   SARS Coronavirus 2 NEGATIVE NEGATIVE Final    Comment: (NOTE) If result is NEGATIVE SARS-CoV-2 target nucleic acids are NOT DETECTED. The SARS-CoV-2 RNA is generally detectable in upper and lower  respiratory specimens during the acute phase of infection. The lowest  concentration  of SARS-CoV-2 viral copies this assay can detect is 250  copies / mL. A negative result does not preclude SARS-CoV-2 infection  and should not be used as the sole basis for treatment or other  patient management decisions.  A negative result may occur with  improper specimen collection / handling, submission  of specimen other  than nasopharyngeal swab, presence of viral mutation(s) within the  areas targeted by this assay, and inadequate number of viral copies  (<250 copies / mL). A negative result must be combined with clinical  observations, patient history, and epidemiological information. If result is POSITIVE SARS-CoV-2 target nucleic acids are DETECTED. The SARS-CoV-2 RNA is generally detectable in upper and lower  respiratory specimens dur ing the acute phase of infection.  Positive  results are indicative of active infection with SARS-CoV-2.  Clinical  correlation with patient history and other diagnostic information is  necessary to determine patient infection status.  Positive results do  not rule out bacterial infection or co-infection with other viruses. If result is PRESUMPTIVE POSTIVE SARS-CoV-2 nucleic acids MAY BE PRESENT.   A presumptive positive result was obtained on the submitted specimen  and confirmed on repeat testing.  While 2019 novel coronavirus  (SARS-CoV-2) nucleic acids may be present in the submitted sample  additional confirmatory testing may be necessary for epidemiological  and / or clinical management purposes  to differentiate between  SARS-CoV-2 and other Sarbecovirus currently known to infect humans.  If clinically indicated additional testing with an alternate test  methodology [phone]) is advised. The SARS-CoV-2 RNA is generally  detectable in upper and lower respiratory sp ecimens during the acute  phase of infection. The expected result is Negative. Fact Sheet for Patients:  StrictlyIdeas.no Fact Sheet for Healthcare Providers: BankingDealers.co.za This test is not yet approved or cleared by the [place] FDA and has been authorized for detection and/or diagnosis of SARS-CoV-2 by FDA under an Emergency Use Authorization (EUA).  This EUA will remain in effect (meaning this test can be used) for  the duration of the COVID-19 declaration under Section 564(b)(1) of the Act, 21 U.S.C. section 360bbb-3(b)(1), unless the authorization is terminated or revoked sooner. Performed at [facility], [place] [address] [place], [place] [address]      Labs: BNP (last 3 results) No results for input(s): BNP in the last 8760 hours. Basic Metabolic Panel: Recent Labs  Lab 09/30/18 2002 10/01/18 0225 10/02/18 0628 10/03/18 0751  NA 134* 135 141 137  K 3.3* 3.7 3.8 4.6  CL 100 100 103 104  CO2 22 25 30 24   GLUCOSE 106* 117* 99 165*  BUN 7 5* 8 10  CREATININE 1.43* 1.24 1.18 1.06  CALCIUM 8.4* 8.7* 8.7* 8.7*  MG  --  1.7 2.1  --   PHOS  --  2.6  --   --    Liver Function Tests: Recent Labs  Lab 09/30/18 2002 10/01/18 0225  AST 24 28  ALT 15 15  ALKPHOS 77 72  BILITOT 0.5 0.5  PROT 7.7 7.6  ALBUMIN 3.8 3.8   No results for input(s): LIPASE, AMYLASE in the last 168 hours. No results for input(s): AMMONIA in the last 168 hours. CBC: Recent Labs  Lab 09/30/18 2002 10/01/18 0225  WBC 11.6* 14.6*  HGB 13.5 13.5  HCT 39.5 40.5  MCV 93.2 93.5  PLT 343 326   Cardiac Enzymes: No results for input(s): CKTOTAL, CKMB, CKMBINDEX, TROPONINI in the last 168 hours. BNP: Invalid input(s):  POCBNP CBG: Recent Labs  Lab 09/30/18 2007  GLUCAP 103*   D-Dimer No results for input(s): DDIMER in the last 72 hours. Hgb A1c No results for input(s): HGBA1C in the last 72 hours. Lipid Profile No results for input(s): CHOL, HDL, LDLCALC, TRIG, CHOLHDL, LDLDIRECT in the last 72 hours. Thyroid function studies No results for input(s): TSH, T4TOTAL, T3FREE, THYROIDAB in the last 72 hours.  Invalid input(s): FREET3 Anemia work up No results for input(s): VITAMINB12, FOLATE, FERRITIN, TIBC, IRON, RETICCTPCT in the last 72 hours. Urinalysis    Component Value Date/Time   COLORURINE YELLOW 09/30/2018 2002   APPEARANCEUR HAZY (A) 09/30/2018 2002   LABSPEC 1.012 09/30/2018 2002    PHURINE 5.0 09/30/2018 2002   GLUCOSEU NEGATIVE 09/30/2018 2002   HGBUR SMALL (A) 09/30/2018 2002   [place] NEGATIVE 09/30/2018 2002   [place] 09/30/2018 2002   PROTEINUR 30 (A) 09/30/2018 2002   UROBILINOGEN 0.2 08/04/2012 1429   NITRITE NEGATIVE 09/30/2018 2002   LEUKOCYTESUR NEGATIVE 09/30/2018 2002   Sepsis Labs Invalid input(s): PROCALCITONIN,  WBC,  LACTICIDVEN Microbiology Recent Results (from the past 240 hour(s))  SARS Coronavirus 2 [facility] order, Performed in [facility] hospital lab) Nasopharyngeal Nasopharyngeal Swab     Status: None   Collection Time: 09/30/18  8:20 PM   Specimen: Nasopharyngeal Swab  Result Value Ref Range Status   SARS Coronavirus 2 NEGATIVE NEGATIVE Final    Comment: (NOTE) If result is NEGATIVE SARS-CoV-2 target nucleic acids are NOT DETECTED. The SARS-CoV-2 RNA is generally detectable in upper and lower  respiratory specimens during the acute phase of infection. The lowest  concentration of SARS-CoV-2 viral copies this assay can detect is 250  copies / mL. A negative result does not preclude SARS-CoV-2 infection  and should not be used as the sole basis for treatment or other  patient management decisions.  A negative result may occur with  improper specimen collection / handling, submission of specimen other  than nasopharyngeal swab, presence of viral mutation(s) within the  areas targeted by this assay, and inadequate number of viral copies  (<250 copies / mL). A negative result must be combined with clinical  observations, patient history, and epidemiological information. If result is POSITIVE SARS-CoV-2 target nucleic acids are DETECTED. The SARS-CoV-2 RNA is generally detectable in upper and lower  respiratory specimens dur ing the acute phase of infection.  Positive  results are indicative of active infection with SARS-CoV-2.  Clinical  correlation with patient history and other diagnostic information is  necessary to  determine patient infection status.  Positive results do  not rule out bacterial infection or co-infection with other viruses. If result is PRESUMPTIVE POSTIVE SARS-CoV-2 nucleic acids MAY BE PRESENT.   A presumptive positive result was obtained on the submitted specimen  and confirmed on repeat testing.  While 2019 novel coronavirus  (SARS-CoV-2) nucleic acids may be present in the submitted sample  additional confirmatory testing may be necessary for epidemiological  and / or clinical management purposes  to differentiate between  SARS-CoV-2 and other Sarbecovirus currently known to infect humans.  If clinically indicated additional testing with an alternate test  methodology [phone]) is advised. The SARS-CoV-2 RNA is generally  detectable in upper and lower respiratory sp ecimens during the acute  phase of infection. The expected result is Negative. Fact Sheet for Patients:  StrictlyIdeas.no Fact Sheet for Healthcare Providers: BankingDealers.co.za This test is not yet approved or cleared by the [place] FDA and has been authorized  for detection and/or diagnosis of SARS-CoV-2 by FDA under an Emergency Use Authorization (EUA).  This EUA will remain in effect (meaning this test can be used) for the duration of the COVID-19 declaration under Section 564(b)(1) of the Act, 21 U.S.C. section 360bbb-3(b)(1), unless the authorization is terminated or revoked sooner. Performed at [facility], [place] [address], [place], [place] [address]      Time coordinating discharge: 45 minutes  SIGNED:   Tawni Millers, MD  [facility] 10/04/2018, 12:13 PM

## 2018-10-05 DIAGNOSIS — G40901 Epilepsy, unspecified, not intractable, with status epilepticus: Secondary | ICD-10-CM

## 2018-10-05 NOTE — Discharge Instructions (Signed)
[name],  You in the hospital because of seizures.  Received injection.  Please take as prescribed.  Please follow-up with primary care physician for hospital follow-up.  You will receive home health physical therapy on discharge.

## 2018-10-05 NOTE — Discharge Summary (Signed)
Patient seen and examined. No changes to discharge summary except patient discharged with home health PT and OT.  Cordelia Poche, MD [facility] 10/11/2018, 5:08 PM

## 2018-10-05 NOTE — TOC Transition Note (Signed)
Transition of Care [facility]) - CM/SW Discharge Note   Patient Details  Name: [name] MRN: [id number] Date of Birth: [birth year]  Transition of Care [facility]) CM/SW Contact:  Geralynn Ochs, LCSW Phone Number: 10/05/2018, 3:02 PM   Clinical Narrative:  CSW spoke with patient and brother via telephonic interpreter service to discuss prior level of function and discharge home. Patient's brother is available 24/7 and is used to providing care for him, he requires help with his ADLs at baseline and with mobility, although he usually walks with a cane. Patient does not have a walker at home, but is agreeable to getting one. CSW discussed possibility of not being able to set up home health due to language barriers, and brother was understanding. CSW spoke with [facility] to discuss home health, and they have therapists on staff who speak [place], so they are willing to try to see if they can work with the patient. Home health set up through [facility] and walker and 3N1 being delivered to the room through [place]. Brother will return to pick up the patient at 4pm.     Final next level of care: [place] Barriers to Discharge: Barriers Resolved   Patient Goals and CMS Choice Patient states their goals for this hospitalization and ongoing recovery are:: to get back home CMS Medicare.gov Compare Post Acute Care list provided to:: Patient Represenative (must comment) Choice offered to / list presented to : Sibling  Discharge Placement                       Discharge Plan and Services     Post Acute Care Choice: Home Health, Durable Medical Equipment          DME Arranged: 3-N-1, Walker rolling DME Agency: [employer] Date DME Agency Contacted: 10/05/18   Representative spoke with at DME Agency: [place]: PT, OT [place] Agency: [place]. [place] Date [place]: 10/05/18   Representative spoke with at [place]: [place] ([place])  Interventions     Readmission Risk Interventions No flowsheet data found.

## 2018-10-05 NOTE — Plan of Care (Signed)
  Problem: Education: Goal: Knowledge of General Education information will improve Description: Including pain rating scale, medication(s)/side effects and non-pharmacologic comfort measures Outcome: Progressing   Problem: Health Behavior/Discharge Planning: Goal: Ability to manage health-related needs will improve Outcome: Progressing   Problem: Clinical Measurements: Goal: Ability to maintain clinical measurements within normal limits will improve Outcome: Progressing Goal: Will remain free from infection Outcome: Progressing Goal: Diagnostic test results will improve Outcome: Progressing Goal: Respiratory complications will improve Outcome: Progressing Goal: Cardiovascular complication will be avoided Outcome: Progressing   Problem: Activity: Goal: Risk for activity intolerance will decrease Outcome: Progressing   Problem: Nutrition: Goal: Adequate nutrition will be maintained Outcome: Progressing   Problem: Coping: Goal: Level of anxiety will decrease Outcome: Progressing   Problem: Elimination: Goal: Will not experience complications related to bowel motility Outcome: Progressing Goal: Will not experience complications related to urinary retention Outcome: Progressing   Problem: Pain Managment: Goal: General experience of comfort will improve Outcome: Progressing   Problem: Safety: Goal: Ability to remain free from injury will improve Outcome: Progressing   Problem: Skin Integrity: Goal: Risk for impaired skin integrity will decrease Outcome: Progressing   Problem: Education: Goal: Expressions of having a comfortable level of knowledge regarding the disease process will increase Outcome: Progressing   Problem: Coping: Goal: Ability to adjust to condition or change in health will improve Outcome: Progressing Goal: Ability to identify appropriate support needs will improve Outcome: Progressing   Problem: Health Behavior/Discharge Planning: Goal:  Compliance with prescribed medication regimen will improve Outcome: Progressing   Problem: Medication: Goal: Risk for medication side effects will decrease Outcome: Progressing   Problem: Clinical Measurements: Goal: Complications related to the disease process, condition or treatment will be avoided or minimized Outcome: Progressing Goal: Diagnostic test results will improve Outcome: Progressing   Problem: Safety: Goal: Verbalization of understanding the information provided will improve Outcome: Progressing   Problem: Self-Concept: Goal: Level of anxiety will decrease Outcome: Progressing Goal: Ability to verbalize feelings about condition will improve Outcome: Progressing   Ival Bible, BSN, RN

## 2018-10-06 NOTE — Progress Notes (Signed)
Patient and brother given discharge instructions and handout. No new questions or concerns. Brother to pick up at 4pm.  Discharged from unit, brother to transport home.

## 2018-10-08 DIAGNOSIS — Z87891 Personal history of nicotine dependence: Secondary | ICD-10-CM | POA: Diagnosis not present

## 2018-10-08 DIAGNOSIS — Z9181 History of falling: Secondary | ICD-10-CM | POA: Diagnosis not present

## 2018-10-08 DIAGNOSIS — Z87820 Personal history of traumatic brain injury: Secondary | ICD-10-CM | POA: Diagnosis not present

## 2018-10-08 DIAGNOSIS — G40901 Epilepsy, unspecified, not intractable, with status epilepticus: Secondary | ICD-10-CM | POA: Diagnosis not present

## 2018-10-08 DIAGNOSIS — G9340 Encephalopathy, unspecified: Secondary | ICD-10-CM | POA: Diagnosis not present

## 2018-10-08 DIAGNOSIS — I129 Hypertensive chronic kidney disease with stage 1 through stage 4 chronic kidney disease, or unspecified chronic kidney disease: Secondary | ICD-10-CM | POA: Diagnosis not present

## 2018-10-08 DIAGNOSIS — Z8701 Personal history of pneumonia (recurrent): Secondary | ICD-10-CM | POA: Diagnosis not present

## 2018-10-08 DIAGNOSIS — F329 Major depressive disorder, single episode, unspecified: Secondary | ICD-10-CM | POA: Diagnosis not present

## 2018-10-08 DIAGNOSIS — N182 Chronic kidney disease, stage 2 (mild): Secondary | ICD-10-CM | POA: Diagnosis not present

## 2018-10-10 DIAGNOSIS — F329 Major depressive disorder, single episode, unspecified: Secondary | ICD-10-CM | POA: Diagnosis not present

## 2018-10-10 DIAGNOSIS — G40901 Epilepsy, unspecified, not intractable, with status epilepticus: Secondary | ICD-10-CM | POA: Diagnosis not present

## 2018-10-10 DIAGNOSIS — Z87820 Personal history of traumatic brain injury: Secondary | ICD-10-CM | POA: Diagnosis not present

## 2018-10-10 DIAGNOSIS — G9340 Encephalopathy, unspecified: Secondary | ICD-10-CM | POA: Diagnosis not present

## 2018-10-10 DIAGNOSIS — I129 Hypertensive chronic kidney disease with stage 1 through stage 4 chronic kidney disease, or unspecified chronic kidney disease: Secondary | ICD-10-CM | POA: Diagnosis not present

## 2018-10-10 DIAGNOSIS — N182 Chronic kidney disease, stage 2 (mild): Secondary | ICD-10-CM | POA: Diagnosis not present

## 2018-10-11 DIAGNOSIS — G9340 Encephalopathy, unspecified: Secondary | ICD-10-CM | POA: Diagnosis not present

## 2018-10-11 DIAGNOSIS — F329 Major depressive disorder, single episode, unspecified: Secondary | ICD-10-CM | POA: Diagnosis not present

## 2018-10-11 DIAGNOSIS — N182 Chronic kidney disease, stage 2 (mild): Secondary | ICD-10-CM | POA: Diagnosis not present

## 2018-10-11 DIAGNOSIS — I129 Hypertensive chronic kidney disease with stage 1 through stage 4 chronic kidney disease, or unspecified chronic kidney disease: Secondary | ICD-10-CM | POA: Diagnosis not present

## 2018-10-11 DIAGNOSIS — G40901 Epilepsy, unspecified, not intractable, with status epilepticus: Secondary | ICD-10-CM | POA: Diagnosis not present

## 2018-10-11 DIAGNOSIS — Z87820 Personal history of traumatic brain injury: Secondary | ICD-10-CM | POA: Diagnosis not present

## 2018-10-13 DIAGNOSIS — Z87820 Personal history of traumatic brain injury: Secondary | ICD-10-CM | POA: Diagnosis not present

## 2018-10-13 DIAGNOSIS — N182 Chronic kidney disease, stage 2 (mild): Secondary | ICD-10-CM | POA: Diagnosis not present

## 2018-10-13 DIAGNOSIS — I129 Hypertensive chronic kidney disease with stage 1 through stage 4 chronic kidney disease, or unspecified chronic kidney disease: Secondary | ICD-10-CM | POA: Diagnosis not present

## 2018-10-13 DIAGNOSIS — F329 Major depressive disorder, single episode, unspecified: Secondary | ICD-10-CM | POA: Diagnosis not present

## 2018-10-13 DIAGNOSIS — G9340 Encephalopathy, unspecified: Secondary | ICD-10-CM | POA: Diagnosis not present

## 2018-10-13 DIAGNOSIS — G40901 Epilepsy, unspecified, not intractable, with status epilepticus: Secondary | ICD-10-CM | POA: Diagnosis not present

## 2018-10-18 DIAGNOSIS — G40901 Epilepsy, unspecified, not intractable, with status epilepticus: Secondary | ICD-10-CM | POA: Diagnosis not present

## 2018-10-18 DIAGNOSIS — G9340 Encephalopathy, unspecified: Secondary | ICD-10-CM | POA: Diagnosis not present

## 2018-10-18 DIAGNOSIS — I129 Hypertensive chronic kidney disease with stage 1 through stage 4 chronic kidney disease, or unspecified chronic kidney disease: Secondary | ICD-10-CM | POA: Diagnosis not present

## 2018-10-18 DIAGNOSIS — Z87820 Personal history of traumatic brain injury: Secondary | ICD-10-CM | POA: Diagnosis not present

## 2018-10-18 DIAGNOSIS — N182 Chronic kidney disease, stage 2 (mild): Secondary | ICD-10-CM | POA: Diagnosis not present

## 2018-10-18 DIAGNOSIS — F329 Major depressive disorder, single episode, unspecified: Secondary | ICD-10-CM | POA: Diagnosis not present

## 2018-10-20 DIAGNOSIS — F329 Major depressive disorder, single episode, unspecified: Secondary | ICD-10-CM | POA: Diagnosis not present

## 2018-10-20 DIAGNOSIS — Z87820 Personal history of traumatic brain injury: Secondary | ICD-10-CM | POA: Diagnosis not present

## 2018-10-20 DIAGNOSIS — I129 Hypertensive chronic kidney disease with stage 1 through stage 4 chronic kidney disease, or unspecified chronic kidney disease: Secondary | ICD-10-CM | POA: Diagnosis not present

## 2018-10-20 DIAGNOSIS — G9340 Encephalopathy, unspecified: Secondary | ICD-10-CM | POA: Diagnosis not present

## 2018-10-20 DIAGNOSIS — N182 Chronic kidney disease, stage 2 (mild): Secondary | ICD-10-CM | POA: Diagnosis not present

## 2018-10-20 DIAGNOSIS — G40901 Epilepsy, unspecified, not intractable, with status epilepticus: Secondary | ICD-10-CM | POA: Diagnosis not present

## 2018-10-25 DIAGNOSIS — N182 Chronic kidney disease, stage 2 (mild): Secondary | ICD-10-CM | POA: Diagnosis not present

## 2018-10-25 DIAGNOSIS — G40901 Epilepsy, unspecified, not intractable, with status epilepticus: Secondary | ICD-10-CM | POA: Diagnosis not present

## 2018-10-25 DIAGNOSIS — F329 Major depressive disorder, single episode, unspecified: Secondary | ICD-10-CM | POA: Diagnosis not present

## 2018-10-25 DIAGNOSIS — Z87820 Personal history of traumatic brain injury: Secondary | ICD-10-CM | POA: Diagnosis not present

## 2018-10-25 DIAGNOSIS — G9340 Encephalopathy, unspecified: Secondary | ICD-10-CM | POA: Diagnosis not present

## 2018-10-25 DIAGNOSIS — I129 Hypertensive chronic kidney disease with stage 1 through stage 4 chronic kidney disease, or unspecified chronic kidney disease: Secondary | ICD-10-CM | POA: Diagnosis not present

## 2018-10-27 DIAGNOSIS — N182 Chronic kidney disease, stage 2 (mild): Secondary | ICD-10-CM | POA: Diagnosis not present

## 2018-10-27 DIAGNOSIS — G40901 Epilepsy, unspecified, not intractable, with status epilepticus: Secondary | ICD-10-CM | POA: Diagnosis not present

## 2018-10-27 DIAGNOSIS — I129 Hypertensive chronic kidney disease with stage 1 through stage 4 chronic kidney disease, or unspecified chronic kidney disease: Secondary | ICD-10-CM | POA: Diagnosis not present

## 2018-10-27 DIAGNOSIS — Z87820 Personal history of traumatic brain injury: Secondary | ICD-10-CM | POA: Diagnosis not present

## 2018-10-27 DIAGNOSIS — G9340 Encephalopathy, unspecified: Secondary | ICD-10-CM | POA: Diagnosis not present

## 2018-10-27 DIAGNOSIS — F329 Major depressive disorder, single episode, unspecified: Secondary | ICD-10-CM | POA: Diagnosis not present

## 2018-11-01 DIAGNOSIS — I129 Hypertensive chronic kidney disease with stage 1 through stage 4 chronic kidney disease, or unspecified chronic kidney disease: Secondary | ICD-10-CM | POA: Diagnosis not present

## 2018-11-01 DIAGNOSIS — Z87820 Personal history of traumatic brain injury: Secondary | ICD-10-CM | POA: Diagnosis not present

## 2018-11-01 DIAGNOSIS — G40901 Epilepsy, unspecified, not intractable, with status epilepticus: Secondary | ICD-10-CM | POA: Diagnosis not present

## 2018-11-01 DIAGNOSIS — N182 Chronic kidney disease, stage 2 (mild): Secondary | ICD-10-CM | POA: Diagnosis not present

## 2018-11-01 DIAGNOSIS — G9340 Encephalopathy, unspecified: Secondary | ICD-10-CM | POA: Diagnosis not present

## 2018-11-01 DIAGNOSIS — F329 Major depressive disorder, single episode, unspecified: Secondary | ICD-10-CM | POA: Diagnosis not present

## 2018-11-03 DIAGNOSIS — N182 Chronic kidney disease, stage 2 (mild): Secondary | ICD-10-CM | POA: Diagnosis not present

## 2018-11-03 DIAGNOSIS — F329 Major depressive disorder, single episode, unspecified: Secondary | ICD-10-CM | POA: Diagnosis not present

## 2018-11-03 DIAGNOSIS — G9340 Encephalopathy, unspecified: Secondary | ICD-10-CM | POA: Diagnosis not present

## 2018-11-03 DIAGNOSIS — Z87820 Personal history of traumatic brain injury: Secondary | ICD-10-CM | POA: Diagnosis not present

## 2018-11-03 DIAGNOSIS — G40901 Epilepsy, unspecified, not intractable, with status epilepticus: Secondary | ICD-10-CM | POA: Diagnosis not present

## 2018-11-03 DIAGNOSIS — I129 Hypertensive chronic kidney disease with stage 1 through stage 4 chronic kidney disease, or unspecified chronic kidney disease: Secondary | ICD-10-CM | POA: Diagnosis not present

## 2018-11-07 DIAGNOSIS — Z87891 Personal history of nicotine dependence: Secondary | ICD-10-CM | POA: Diagnosis not present

## 2018-11-07 DIAGNOSIS — G40901 Epilepsy, unspecified, not intractable, with status epilepticus: Secondary | ICD-10-CM | POA: Diagnosis not present

## 2018-11-07 DIAGNOSIS — G9340 Encephalopathy, unspecified: Secondary | ICD-10-CM | POA: Diagnosis not present

## 2018-11-07 DIAGNOSIS — Z87820 Personal history of traumatic brain injury: Secondary | ICD-10-CM | POA: Diagnosis not present

## 2018-11-07 DIAGNOSIS — F329 Major depressive disorder, single episode, unspecified: Secondary | ICD-10-CM | POA: Diagnosis not present

## 2018-11-07 DIAGNOSIS — Z8701 Personal history of pneumonia (recurrent): Secondary | ICD-10-CM | POA: Diagnosis not present

## 2018-11-07 DIAGNOSIS — N182 Chronic kidney disease, stage 2 (mild): Secondary | ICD-10-CM | POA: Diagnosis not present

## 2018-11-07 DIAGNOSIS — I129 Hypertensive chronic kidney disease with stage 1 through stage 4 chronic kidney disease, or unspecified chronic kidney disease: Secondary | ICD-10-CM | POA: Diagnosis not present

## 2018-11-07 DIAGNOSIS — Z9181 History of falling: Secondary | ICD-10-CM | POA: Diagnosis not present

## 2018-11-08 DIAGNOSIS — N182 Chronic kidney disease, stage 2 (mild): Secondary | ICD-10-CM | POA: Diagnosis not present

## 2018-11-08 DIAGNOSIS — G40901 Epilepsy, unspecified, not intractable, with status epilepticus: Secondary | ICD-10-CM | POA: Diagnosis not present

## 2018-11-08 DIAGNOSIS — F329 Major depressive disorder, single episode, unspecified: Secondary | ICD-10-CM | POA: Diagnosis not present

## 2018-11-08 DIAGNOSIS — G9340 Encephalopathy, unspecified: Secondary | ICD-10-CM | POA: Diagnosis not present

## 2018-11-08 DIAGNOSIS — I129 Hypertensive chronic kidney disease with stage 1 through stage 4 chronic kidney disease, or unspecified chronic kidney disease: Secondary | ICD-10-CM | POA: Diagnosis not present

## 2018-11-08 DIAGNOSIS — Z87820 Personal history of traumatic brain injury: Secondary | ICD-10-CM | POA: Diagnosis not present

## 2018-11-15 ENCOUNTER — Telehealth: Payer: Self-pay | Admitting: *Deleted

## 2018-11-15 DIAGNOSIS — N182 Chronic kidney disease, stage 2 (mild): Secondary | ICD-10-CM | POA: Diagnosis not present

## 2018-11-15 DIAGNOSIS — F329 Major depressive disorder, single episode, unspecified: Secondary | ICD-10-CM | POA: Diagnosis not present

## 2018-11-15 DIAGNOSIS — I129 Hypertensive chronic kidney disease with stage 1 through stage 4 chronic kidney disease, or unspecified chronic kidney disease: Secondary | ICD-10-CM | POA: Diagnosis not present

## 2018-11-15 DIAGNOSIS — G40901 Epilepsy, unspecified, not intractable, with status epilepticus: Secondary | ICD-10-CM | POA: Diagnosis not present

## 2018-11-15 DIAGNOSIS — G9340 Encephalopathy, unspecified: Secondary | ICD-10-CM | POA: Diagnosis not present

## 2018-11-15 DIAGNOSIS — Z87820 Personal history of traumatic brain injury: Secondary | ICD-10-CM | POA: Diagnosis not present

## 2018-11-15 NOTE — Telephone Encounter (Signed)
Received fax from [place], regarding appt for pt since in the hospital for SZ 09-30-18.  I attempted to call thru interpreter yesterday and was unable to get thru with language.  After looking more, found [place] dialect and spoke to [place] to interpret, but phone # busy. Will have to call back later.

## 2018-11-16 ENCOUNTER — Encounter: Payer: Self-pay | Admitting: *Deleted

## 2018-11-16 NOTE — Telephone Encounter (Signed)
I called [name] and spoke to chelsea in office.  I relayed that sent letter after multiple attempts to call and make appt.  She will let the therapist know as well, if they were able to relay to pt.  (or to relay to expect letter ).  [phone].

## 2018-11-16 NOTE — Telephone Encounter (Signed)
I have attempted to call x 2 (once CS rep had hung up, the 2nd time no own picked up, blocked third party.  [place] CS rep 2nd time, Dy ID GRDS from [place] interpreters both times.  I will send letter to pts home. (hopefully will get someone to translate to [place] for pt. And pts brother.  Letter mailed to home address.

## 2018-12-20 ENCOUNTER — Encounter (HOSPITAL_COMMUNITY): Payer: Self-pay | Admitting: Emergency Medicine

## 2018-12-20 ENCOUNTER — Emergency Department (HOSPITAL_COMMUNITY)
Admission: EM | Admit: 2018-12-20 | Discharge: 2018-12-20 | Disposition: A | Discharge status: Home / Self Care | Payer: Medicare Other | Attending: Emergency Medicine | Admitting: Emergency Medicine

## 2018-12-20 DIAGNOSIS — R52 Pain, unspecified: Secondary | ICD-10-CM | POA: Diagnosis not present

## 2018-12-20 DIAGNOSIS — Z5321 Procedure and treatment not carried out due to patient leaving prior to being seen by health care provider: Secondary | ICD-10-CM | POA: Insufficient documentation

## 2018-12-20 DIAGNOSIS — M79672 Pain in left foot: Secondary | ICD-10-CM | POA: Diagnosis not present

## 2018-12-20 LAB — CBC WITH DIFFERENTIAL/PLATELET
Abs Immature Granulocytes: 0.04 10*3/uL (ref 0.00–0.07)
Basophils Absolute: 0 10*3/uL (ref 0.0–0.1)
Basophils Relative: 0 %
Eosinophils Absolute: 0.3 10*3/uL (ref 0.0–0.5)
Eosinophils Relative: 3 %
HCT: 44.7 % (ref 39.0–52.0)
Hemoglobin: 14.6 g/dL (ref 13.0–17.0)
Immature Granulocytes: 0 %
Lymphocytes Relative: 18 %
Lymphs Abs: 1.9 10*3/uL (ref 0.7–4.0)
MCH: 31.9 pg (ref 26.0–34.0)
MCHC: 32.7 g/dL (ref 30.0–36.0)
MCV: 97.8 fL (ref 80.0–100.0)
Monocytes Absolute: 0.8 10*3/uL (ref 0.1–1.0)
Monocytes Relative: 8 %
Neutro Abs: 7.7 10*3/uL (ref 1.7–7.7)
Neutrophils Relative %: 71 %
Platelets: 377 10*3/uL (ref 150–400)
RBC: 4.57 MIL/uL (ref 4.22–5.81)
RDW: 13.4 % (ref 11.5–15.5)
WBC: 10.8 10*3/uL — ABNORMAL HIGH (ref 4.0–10.5)
nRBC: 0 % (ref 0.0–0.2)

## 2018-12-20 LAB — COMPREHENSIVE METABOLIC PANEL
ALT: 18 U/L (ref 0–44)
AST: 25 U/L (ref 15–41)
Albumin: 4.5 g/dL (ref 3.5–5.0)
Alkaline Phosphatase: 74 U/L (ref 38–126)
Anion gap: 13 (ref 5–15)
BUN: 19 mg/dL (ref 6–20)
CO2: 25 mmol/L (ref 22–32)
Calcium: 9.5 mg/dL (ref 8.9–10.3)
Chloride: 101 mmol/L (ref 98–111)
Creatinine, Ser: 1.53 mg/dL — ABNORMAL HIGH (ref 0.61–1.24)
GFR calc Af Amer: 56 mL/min — ABNORMAL LOW (ref >60)
GFR calc non Af Amer: 49 mL/min — ABNORMAL LOW (ref >60)
Glucose, Bld: 112 mg/dL — ABNORMAL HIGH (ref 70–99)
Potassium: 5.2 mmol/L — ABNORMAL HIGH (ref 3.5–5.1)
Sodium: 139 mmol/L (ref 135–145)
Total Bilirubin: 0.8 mg/dL (ref 0.3–1.2)
Total Protein: 8.6 g/dL — ABNORMAL HIGH (ref 6.5–8.1)

## 2018-12-20 LAB — LACTIC ACID, PLASMA: Lactic Acid, Venous: 1.9 mmol/L (ref 0.5–1.9)

## 2018-12-20 NOTE — ED Notes (Signed)
INTERPRETER PRESENT. UPDATED ON PT CURRENT STATUS AND BED STATUS. INFORMED BY PT HE WILL GO TO PCP TOMORROW. LEFT WITHOUT EVENT

## 2018-12-20 NOTE — ED Notes (Signed)
Patient's brother needs to be called when he is discharge. He will try to get him a ride home. [place] [phone]. Brother speaks broken [place].

## 2018-12-20 NOTE — ED Triage Notes (Signed)
Per EMS, states infection on the bottom of his left foot-patient speaks very little english

## 2018-12-21 DIAGNOSIS — G40909 Epilepsy, unspecified, not intractable, without status epilepticus: Secondary | ICD-10-CM | POA: Diagnosis not present

## 2018-12-21 DIAGNOSIS — Z125 Encounter for screening for malignant neoplasm of prostate: Secondary | ICD-10-CM | POA: Diagnosis not present

## 2018-12-21 DIAGNOSIS — Z13220 Encounter for screening for lipoid disorders: Secondary | ICD-10-CM | POA: Diagnosis not present

## 2018-12-21 DIAGNOSIS — Z0000 Encounter for general adult medical examination without abnormal findings: Secondary | ICD-10-CM | POA: Diagnosis not present

## 2018-12-21 DIAGNOSIS — T23209A Burn of second degree of unspecified hand, unspecified site, initial encounter: Secondary | ICD-10-CM | POA: Diagnosis not present

## 2018-12-21 DIAGNOSIS — I10 Essential (primary) hypertension: Secondary | ICD-10-CM | POA: Diagnosis not present

## 2018-12-21 DIAGNOSIS — E559 Vitamin D deficiency, unspecified: Secondary | ICD-10-CM | POA: Diagnosis not present

## 2018-12-21 DIAGNOSIS — Z131 Encounter for screening for diabetes mellitus: Secondary | ICD-10-CM | POA: Diagnosis not present

## 2018-12-23 DIAGNOSIS — F329 Major depressive disorder, single episode, unspecified: Secondary | ICD-10-CM | POA: Diagnosis not present

## 2018-12-23 DIAGNOSIS — F439 Reaction to severe stress, unspecified: Secondary | ICD-10-CM | POA: Diagnosis not present

## 2019-01-08 NOTE — Progress Notes (Signed)
PATIENT: [name] DOB: [birth year]  REASON FOR VISIT: follow up HISTORY FROM: patient  Chief Complaint  Patient presents with   Follow-up    Seizure follow up room 2 with interpreter his temp is 99.0 and his brother [name] and his temp is 99.0 last seizure was 10/2018     HISTORY OF PRESENT ILLNESS: Today 01/10/19 [name] is a 58 y.o. male here today for follow up for seizure. He was admitted to the hospital in 08/2018 for prolonged seizure. He had not been complaint with therapy. He was discharged and advised to continue levetiracetam 1500mg  twice daily and phenytoin 150mg  twice daily. His brother is primary caregiver. 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 He is seen by PCP regularly. PCP recently increased phenytoin to 200mg  twice daily. His brother reports that he has not had a seizure since increase about 1 month ago. He is assisting [name] with medications. [name] is able to bath and dress him self. He does not drive. Brother reports that sometimes he is oriented to place and time and sometimes he seems confused. He will often say "I don't know" to questions if he is anxious. Native language [place]. He is present with his interpreter, who also serves as his Theme park manager. Interpreter agrees that patient can seem normal one day and "not himself" the next day. He has been working with PCP for adjustment of depression/anxiety medications. Family is uncertain if this is helping.    HISTORY: (copied from my note on 08/08/2018)  [name] is a 58 y.o. male here today for follow up of seizure.  His brother was used to interpret per patient request.  [name] is doing well with current treatment of levetiracetam 1500mg  BID and phenytoin 200mg  BID.  He denies any recent seizure activity.  Last seizure was in July  2019.  He does not drive.  He is able to dress and bathe himself.  He has no concerns today.   REVIEW OF SYSTEMS: Out of a complete 14 system review of symptoms, the patient complains only of the following symptoms, seizure, memory loss, anxiety, depression and all other reviewed systems are negative.  ALLERGIES: No Known Allergies  HOME MEDICATIONS: Outpatient Medications Prior to Visit  Medication Sig Dispense Refill   cephALEXin (KEFLEX) 500 MG capsule Take 500 mg by mouth 4 (four) times daily.     DICLOFENAC PO Take 75 mg by mouth as needed.     FLUoxetine (PROZAC) 40 MG capsule Take 40 mg by mouth daily.     ibuprofen (ADVIL) 800 MG tablet Take 800 mg by mouth every 8 (eight) hours as needed.     levETIRAcetam (KEPPRA) 750 MG tablet Take 2 tablets (1,500 mg total) by mouth 2 (two) times daily. 120 tablet 0   lisinopril (ZESTRIL) 10 MG tablet Take 10 mg by mouth daily.     phenytoin (DILANTIN) 200 MG ER capsule Take 200 mg by mouth 2 (two) times daily.     phenytoin (DILANTIN) 50 MG tablet Chew 3 tablets (150 mg total) by mouth 2 (two) times daily. (Patient not taking: Reported on 01/09/2019) 180 tablet 0   No facility-administered medications prior to visit.     PAST MEDICAL HISTORY: Past Medical History:  Diagnosis Date   Anxiety    Back pain  low back pain/ compressed disk in lower back.    CKD (chronic kidney disease), stage II    Depression    Hypertension    Language barrier 03-01-13   Speaks very little English,primary-Vietnamese "[name] Dialect"   Seizures ([place])    due head trauma-seizures are less, but occurs from mild to more severe, which causes agitation to mental state.   TBI (traumatic brain injury) [facility])    CARE FOR BY BROTHER    PAST SURGICAL HISTORY: Past Surgical History:  Procedure Laterality Date   ABDOMINAL SURGERY     COLONOSCOPY N/A 03/17/2013   Procedure: COLONOSCOPY;  Surgeon: [name], MD;  Location: [facility];   Service: Endoscopy;  Laterality: N/A;   NO PAST SURGERIES     ? one surgery in [place]-? what, has abdominal scar.    FAMILY HISTORY: Family History  Problem Relation Age of Onset   Seizures Mother        per his nonbiological brother    SOCIAL HISTORY: Social History   Socioeconomic History   Marital status: Single    Spouse name: Not on file   Number of children: Not on file   Years of education: 4   Highest education level: Not on file  Occupational History    Employer: UNEMPLOYED  Social Needs   Financial resource strain: Not on file   Food insecurity    Worry: Not on file    Inability: Not on file   Transportation needs    Medical: Not on file    Non-medical: Not on file  Tobacco Use   Smoking status: Former Smoker    Quit date: 03/02/1989    Years since quitting: 29.8   Smokeless tobacco: Never Used  Substance and Sexual Activity   Alcohol use: No   Drug use: No   Sexual activity: Not Currently  Lifestyle   Physical activity    Days per week: Not on file    Minutes per session: Not on file   Stress: Not on file  Relationships   Social connections    Talks on phone: Not on file    Gets together: Not on file    Attends religious service: Not on file    Active member of club or organization: Not on file    Attends meetings of clubs or organizations: Not on file    Relationship status: Not on file   Intimate partner violence    Fear of current or ex partner: Not on file    Emotionally abused: Not on file    Physically abused: Not on file    Forced sexual activity: Not on file  Other Topics Concern   Not on file  Social History Narrative   ** Merged History Encounter **    Patient is single.   Patient is disabled.   Patient has a 4th grade education.   Patient drinks two cups of coffee daily.      PHYSICAL EXAM  Vitals:   01/09/19 1454  BP: 106/73  Pulse: 85  Temp: 97.9 F (36.6 C)  Weight: 175 lb (79.4 kg)  [name]: 5'  5" (1.651 m)   Body mass index is 29.12 kg/m.  Generalized: Well developed, in no acute distress  Cardiology: normal rate and rhythm, no murmur noted Neurological examination  Mentation: Alert, oriented to place and some history taking. He repeatively answered "I don't know" when asked questions regarding time, date, etc. Follows intermittent commands speech and language fluent Cranial nerve  II-XII: Pupils were equal round reactive to light. Extraocular movements were full, visual field were full on confrontational test. Unable to fully assess due to lack of patient cooperation.  Motor: The motor testing reveals 5 over 5 strength of all 4 extremities. Good symmetric motor tone is noted throughout.  Sensory: Sensory testing is intact to soft touch on all 4 extremities. No evidence of extinction is noted.  Coordination: Cerebellar testing reveals good finger-nose-finger and heel-to-shin bilaterally.  Gait and station: Gait is normal.  Reflexes: Deep tendon reflexes are symmetric and normal bilaterally.   DIAGNOSTIC DATA (LABS, IMAGING, TESTING) - I reviewed patient records, labs, notes, testing and imaging myself where available.  No flowsheet data found.   Lab Results  Component Value Date   WBC 10.8 (H) 12/20/2018   HGB 14.6 12/20/2018   HCT 44.7 12/20/2018   MCV 97.8 12/20/2018   PLT 377 12/20/2018      Component Value Date/Time   NA 139 12/20/2018 1517   NA 144 03/22/2017 1015   K 5.2 (H) 12/20/2018 1517   CL 101 12/20/2018 1517   CO2 25 12/20/2018 1517   GLUCOSE 112 (H) 12/20/2018 1517   BUN 19 12/20/2018 1517   BUN 13 03/22/2017 1015   CREATININE 1.53 (H) 12/20/2018 1517   CALCIUM 9.5 12/20/2018 1517   PROT 8.6 (H) 12/20/2018 1517   PROT 8.4 03/22/2017 1015   ALBUMIN 4.5 12/20/2018 1517   ALBUMIN 4.4 03/22/2017 1015   AST 25 12/20/2018 1517   ALT 18 12/20/2018 1517   ALKPHOS 74 12/20/2018 1517   BILITOT 0.8 12/20/2018 1517   BILITOT 0.3 03/22/2017 1015   GFRNONAA  49 (L) 12/20/2018 1517   GFRAA 56 (L) 12/20/2018 1517   No results found for: CHOL, HDL, LDLCALC, LDLDIRECT, TRIG, CHOLHDL Lab Results  Component Value Date   HGBA1C 5.8 (H) 03/27/2016   Lab Results  Component Value Date   [id number] 03/27/2016   Lab Results  Component Value Date   TSH 1.107 10/01/2018       ASSESSMENT AND PLAN 58 y.o. year old male  has a past medical history of Anxiety, Back pain, CKD (chronic kidney disease), stage II, Depression, Hypertension, Language barrier (03-01-13), Seizures ([place]), and TBI (traumatic brain injury) ([place]). here with     ICD-10-CM   1. Seizure ([place])  R56.9   2. Noncompliance with medication regimen  Z91.14   3. Cognitive impairment  R41.89   4. Anxiety and depression  F41.9    F32.9     Damon Laramie continues to have concerns of seizure activity and noncompliance with medication regimen. His brother has helped with medications for the past month. PCP increased phenytoin to 200mg  twice daily and continued levetiracetam 750mg  twice daily. Labs reviewed from hospital admission. He will continue current therapy. He will continue close follow up with PCP for anxiety/depression management. His brother feels capable of continuing to care for Damon Alvis. Brother and interpretor Administrator, Civil Service) encouraged to speak with PCP regarding any community resources/home health needs if any arise. Seizure precautions discussed. Follow up in 3 months advised. Patient and his brother verbalize understanding.    No orders of the defined types were placed in this encounter.    No orders of the defined types were placed in this encounter.     I spent 45 minutes with the patient. 50% of this time was spent counseling and educating patient on plan of care and medications.    Tresia Revolorio, FNP-C 01/10/2019, 7:40  PM [facility] [address] [place], [place] [address] [phone]

## 2019-01-09 ENCOUNTER — Other Ambulatory Visit: Payer: Self-pay

## 2019-01-09 ENCOUNTER — Ambulatory Visit (INDEPENDENT_AMBULATORY_CARE_PROVIDER_SITE_OTHER): Payer: Medicare Other | Admitting: Family Medicine

## 2019-01-09 ENCOUNTER — Encounter: Payer: Self-pay | Admitting: Family Medicine

## 2019-01-09 VITALS — BP 106/73 | HR 85 | Temp 97.9°F | Ht 65.0 in | Wt 175.0 lb

## 2019-01-09 DIAGNOSIS — R4189 Other symptoms and signs involving cognitive functions and awareness: Secondary | ICD-10-CM

## 2019-01-09 DIAGNOSIS — Z9114 Patient's other noncompliance with medication regimen: Secondary | ICD-10-CM | POA: Diagnosis not present

## 2019-01-09 DIAGNOSIS — F419 Anxiety disorder, unspecified: Secondary | ICD-10-CM

## 2019-01-09 DIAGNOSIS — F329 Major depressive disorder, single episode, unspecified: Secondary | ICD-10-CM | POA: Diagnosis not present

## 2019-01-09 DIAGNOSIS — F32A Depression, unspecified: Secondary | ICD-10-CM

## 2019-01-09 DIAGNOSIS — R569 Unspecified convulsions: Secondary | ICD-10-CM | POA: Diagnosis not present

## 2019-01-09 NOTE — Patient Instructions (Signed)
We will continue Dr Avbuere's plan. Continue levetiracetam 1500mg  twice daily. Phenytoin 200mg  twice daily.   We will requests labs from PCP  Follow up with PCP on 01/30/2019  Follow up with me in 3 months  Seizure, Adult A seizure is a sudden burst of abnormal electrical activity in the brain. Seizures usually last from 30 seconds to 2 minutes. They can cause many different symptoms. Usually, seizures are not harmful unless they last a long time. What are the causes? Common causes of this condition include:  Fever or infection.  Conditions that affect the brain, such as: ? A brain abnormality that you were born with. ? A brain or head injury. ? Bleeding in the brain. ? A tumor. ? Stroke. ? Brain disorders such as autism or cerebral palsy.  Low blood sugar.  Conditions that are passed from parent to child (are inherited).  Problems with substances, such as: ? Having a reaction to a drug or a medicine. ? Suddenly stopping the use of a substance (withdrawal). In some cases, the cause may not be known. A person who has repeated seizures over time without a clear cause has a condition called epilepsy. What increases the risk? You are more likely to get this condition if you have:  A family history of epilepsy.  Had a seizure in the past.  A brain disorder.  A history of head injury, lack of oxygen at birth, or strokes. What are the signs or symptoms? There are many types of seizures. The symptoms vary depending on the type of seizure you have. Examples of symptoms during a seizure include:  Shaking (convulsions).  Stiffness in the body.  Passing out (losing consciousness).  Head nodding.  Staring.  Not responding to sound or touch.  Loss of bladder control and bowel control. Some people have symptoms right before and right after a seizure happens. Symptoms before a seizure may include:  Fear.  Worry (anxiety).  Feeling like you may vomit (nauseous).   Feeling like the room is spinning (vertigo).  Feeling like you saw or heard something before (dj vu).  Odd tastes or smells.  Changes in how you see. You may see flashing lights or spots. Symptoms after a seizure happens can include:  Confusion.  Sleepiness.  Headache.  Weakness on one side of the body. How is this treated? Most seizures will stop on their own in under 5 minutes. In these cases, no treatment is needed. Seizures that last longer than 5 minutes will usually need treatment. Treatment can include:  Medicines given through an IV tube.  Avoiding things that are known to cause your seizures. These can include medicines that you take for another condition.  Medicines to treat epilepsy.  Surgery to stop the seizures. This may be needed if medicines do not help. Follow these instructions at home: Medicines  Take over-the-counter and prescription medicines only as told by your doctor.  Do not eat or drink anything that may keep your medicine from working, such as alcohol. Activity  Do not do any activities that would be dangerous if you had another seizure, like driving or swimming. Wait until your doctor says it is safe for you to do them.  If you live in the [place] ask your local DMV (department of motor vehicles) when you can drive.  Get plenty of rest. Teaching others Teach friends and family what to do when you have a seizure. They should:  Lay you on the ground.  Protect your head  and body.  Loosen any tight clothing around your neck.  Turn you on your side.  Not hold you down.  Not put anything into your mouth.  Know whether or not you need emergency care.  Stay with you until you are better.  General instructions  Contact your doctor each time you have a seizure.  Avoid anything that gives you seizures.  Keep a seizure diary. Write down: ? What you think caused each seizure. ? What you remember about each seizure.  Keep all follow-up  visits as told by your doctor. This is important. Contact a doctor if:  You have another seizure.  You have seizures more often.  There is any change in what happens during your seizures.  You keep having seizures with treatment.  You have symptoms of being sick or having an infection. Get help right away if:  You have a seizure that: ? Lasts longer than 5 minutes. ? Is different than seizures you had before. ? Makes it harder to breathe. ? Happens after you hurt your head.  You have any of these symptoms after a seizure: ? Not being able to speak. ? Not being able to use a part of your body. ? Confusion. ? A bad headache.  You have two or more seizures in a row.  You do not wake up right after a seizure.  You get hurt during a seizure. These symptoms may be an emergency. Do not wait to see if the symptoms will go away. Get medical help right away. Call your local emergency services (911 in the [place] Do not drive yourself to the hospital. Summary  Seizures usually last from 30 seconds to 2 minutes. Usually, they are not harmful unless they last a long time.  Do not eat or drink anything that may keep your medicine from working, such as alcohol.  Teach friends and family what to do when you have a seizure.  Contact your doctor each time you have a seizure. This information is not intended to replace advice given to you by your health care provider. Make sure you discuss any questions you have with your health care provider. Document Released: 08/05/2007 Document Revised: 05/06/2018 Document Reviewed: 05/06/2018 Elsevier Patient Education  [place].

## 2019-01-10 ENCOUNTER — Encounter: Payer: Self-pay | Admitting: Family Medicine

## 2019-01-10 DIAGNOSIS — R4189 Other symptoms and signs involving cognitive functions and awareness: Secondary | ICD-10-CM | POA: Insufficient documentation

## 2019-01-16 NOTE — Progress Notes (Signed)
I have reviewed and agreed above plan. 

## 2019-01-30 DIAGNOSIS — G40909 Epilepsy, unspecified, not intractable, without status epilepticus: Secondary | ICD-10-CM | POA: Diagnosis not present

## 2019-01-30 DIAGNOSIS — F329 Major depressive disorder, single episode, unspecified: Secondary | ICD-10-CM | POA: Diagnosis not present

## 2019-01-30 DIAGNOSIS — I10 Essential (primary) hypertension: Secondary | ICD-10-CM | POA: Diagnosis not present

## 2019-02-07 ENCOUNTER — Ambulatory Visit: Payer: Medicare Other | Admitting: Family Medicine

## 2019-03-10 DIAGNOSIS — F329 Major depressive disorder, single episode, unspecified: Secondary | ICD-10-CM | POA: Diagnosis not present

## 2019-03-10 DIAGNOSIS — F439 Reaction to severe stress, unspecified: Secondary | ICD-10-CM | POA: Diagnosis not present

## 2019-03-31 DIAGNOSIS — G40909 Epilepsy, unspecified, not intractable, without status epilepticus: Secondary | ICD-10-CM | POA: Diagnosis not present

## 2019-03-31 DIAGNOSIS — I10 Essential (primary) hypertension: Secondary | ICD-10-CM | POA: Diagnosis not present

## 2019-03-31 DIAGNOSIS — J302 Other seasonal allergic rhinitis: Secondary | ICD-10-CM | POA: Diagnosis not present

## 2019-03-31 DIAGNOSIS — F329 Major depressive disorder, single episode, unspecified: Secondary | ICD-10-CM | POA: Diagnosis not present

## 2019-04-11 ENCOUNTER — Ambulatory Visit (INDEPENDENT_AMBULATORY_CARE_PROVIDER_SITE_OTHER): Payer: Medicare Other | Admitting: Family Medicine

## 2019-04-11 ENCOUNTER — Other Ambulatory Visit: Payer: Self-pay

## 2019-04-11 ENCOUNTER — Encounter: Payer: Self-pay | Admitting: Family Medicine

## 2019-04-11 VITALS — BP 160/94 | HR 73 | Temp 97.8°F | Ht 65.0 in | Wt 180.8 lb

## 2019-04-11 DIAGNOSIS — R569 Unspecified convulsions: Secondary | ICD-10-CM | POA: Diagnosis not present

## 2019-04-11 MED ORDER — LEVETIRACETAM 750 MG PO TABS: 1500.0000 mg | ORAL_TABLET | Freq: Two times a day (BID) | ORAL | 5 refills | Status: DC

## 2019-04-11 MED ORDER — PHENYTOIN SODIUM EXTENDED 200 MG PO CAPS: 200.0000 mg | ORAL_CAPSULE | Freq: Two times a day (BID) | ORAL | 5 refills | Status: DC

## 2019-04-11 NOTE — Progress Notes (Signed)
PATIENT: [name] DOB: 1960/07/07  REASON FOR VISIT: follow up HISTORY FROM: patient  Chief Complaint  Patient presents with  . Follow-up    New rm, with brother. Everything is the same.      HISTORY OF PRESENT ILLNESS: Today 04/11/19 [name] is a 59 y.o. male here today for follow up for seizure. He continues phenytoin ER 200mg  twice daily and levetiracetam 1500 mg twice daily. He does not speak english and HPI is provided by his brother and their interpreter/pastor. His brother reports that he is doing well. No seizures since last being seen. PCP has been refilling medications for him. He stopped lisinopril due to concerns of low BP readings but was recently told by PCP to resume lisinopril.    HISTORY: (copied from my note on 01/10/2019)  [name] is a 59 y.o. male here today for follow up for seizure. He was admitted to the hospital in 08/2018 for prolonged seizure. He had not been complaint with therapy. He was discharged and advised to continue levetiracetam 1500mg  twice daily and phenytoin 150mg  twice daily. His brother is primary caregiver. 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 He is seen by PCP regularly. PCP recently increased phenytoin to 200mg  twice daily. His brother reports that he has not had a seizure since increase about 1 month ago. He is assisting Mr Mohon with medications. Mr Reish is able to bath and dress him self. He does not drive. Brother reports that sometimes he is oriented to place and time and sometimes he seems confused. He will often say "I don't know" to questions if he is anxious. Native language [place]. He is present with his interpreter, who also serves as his Theme park manager. Interpreter agrees that patient can seem normal one day and "not himself" the next day. He has been  working with PCP for adjustment of depression/anxiety medications. Family is uncertain if this is helping.    HISTORY: (copied from my note on 08/08/2018)  [name] a 59 y.o.malehere today for follow up of seizure.His brother was used to interpret per patient request. Mr.[name] doing well with current treatment oflevetiracetam 1500mg  BID and phenytoin 200mg  BID.He denies any recent seizure activity. Last seizure was in July 2019. He does not drive. He is able to dress andbathe himself. He has no concerns today.   REVIEW OF SYSTEMS: Out of a complete 14 system review of symptoms, the patient complains only of the following symptoms, none and all other reviewed systems are negative.  ALLERGIES: No Known Allergies  HOME MEDICATIONS: Outpatient Medications Prior to Visit  Medication Sig Dispense Refill  . DICLOFENAC PO Take 75 mg by mouth as needed.    [name] FLUoxetine (PROZAC) 40 MG capsule Take 40 mg by mouth daily.    . phenytoin (DILANTIN) 200 MG ER capsule Take 200 mg by mouth 2 (two) times daily.    [name] levETIRAcetam (KEPPRA) 750 MG tablet Take 2 tablets (1,500 mg total) by mouth 2 (two) times daily. 120 tablet 0  . cephALEXin (KEFLEX) 500 MG capsule Take 500 mg by mouth 4 (four) times daily.    [name] ibuprofen (ADVIL) 800 MG tablet Take 800 mg by mouth every 8 (eight) hours as needed.    [name] lisinopril (ZESTRIL) 10 MG tablet Take 10 mg by mouth daily.  No facility-administered medications prior to visit.    PAST MEDICAL HISTORY: Past Medical History:  Diagnosis Date  . Anxiety   . Back pain    low back pain/ compressed disk in lower back.   . CKD (chronic kidney disease), stage II   . Depression   . Hypertension   . Language barrier 03-01-13   Speaks very little English,primary-Vietnamese "[name] Dialect"  . Seizures ([place])    due head trauma-seizures are less, but occurs from mild to more severe, which causes agitation to mental state.  [name] TBI (traumatic brain  injury) ([place])    CARE FOR BY BROTHER    PAST SURGICAL HISTORY: Past Surgical History:  Procedure Laterality Date  . ABDOMINAL SURGERY    . COLONOSCOPY N/A 03/17/2013   Procedure: COLONOSCOPY;  Surgeon: [name], MD;  Location: [facility];  Service: Endoscopy;  Laterality: N/A;  . NO PAST SURGERIES     ? one surgery in [place]-? what, has abdominal scar.    FAMILY HISTORY: Family History  Problem Relation Age of Onset  . Seizures Mother        per his nonbiological brother    SOCIAL HISTORY: Social History   Socioeconomic History  . Marital status: Single    Spouse name: Not on file  . Number of children: Not on file  . Years of education: 4  . Highest education level: Not on file  Occupational History    Employer: UNEMPLOYED  Tobacco Use  . Smoking status: Former Smoker    Quit date: 03/02/1989    Years since quitting: 30.1  . Smokeless tobacco: Never Used  Substance and Sexual Activity  . Alcohol use: No  . Drug use: No  . Sexual activity: Not Currently  Other Topics Concern  . Not on file  Social History Narrative   ** Merged History Encounter **    Patient is single.   Patient is disabled.   Patient has a 4th grade education.   Patient drinks two cups of coffee daily.   Social Determinants of Health   Financial Resource Strain:   . Difficulty of Paying Living Expenses: Not on file  Food Insecurity:   . Worried About Charity fundraiser in the Last Year: Not on file  . Ran Out of Food in the Last Year: Not on file  Transportation Needs:   . Lack of Transportation (Medical): Not on file  . Lack of Transportation (Non-Medical): Not on file  Physical Activity:   . Days of Exercise per Week: Not on file  . Minutes of Exercise per Session: Not on file  Stress:   . Feeling of Stress : Not on file  Social Connections:   . Frequency of Communication with Friends and Family: Not on file  . Frequency of Social Gatherings with Friends and Family: Not on  file  . Attends Religious Services: Not on file  . Active Member of Clubs or Organizations: Not on file  . Attends Archivist Meetings: Not on file  . Marital Status: Not on file  Intimate Partner Violence:   . Fear of Current or Ex-Partner: Not on file  . Emotionally Abused: Not on file  . Physically Abused: Not on file  . Sexually Abused: Not on file      PHYSICAL EXAM  Vitals:   04/11/19 1429  BP: (!) 160/94  Pulse: 73  Temp: 97.8 F (36.6 C)  Weight: 180 lb 12.8 oz (82 kg)  Height: 5'  5" (1.651 m)   Body mass index is 30.09 kg/m.  Generalized: Well developed, in no acute distress  Cardiology: normal rate and rhythm, no murmur noted Neurological examination  Mentation: Alert , unable to assess orientation, patient replies "I don't know" to all questions  Cranial nerve II-XII: Pupils were equal round reactive to light. Extraocular movements were full, visual field were full  Motor: The motor testing reveals 5 over 5 strength of all 4 extremities. Good symmetric motor tone is noted throughout.  Sensory: Sensory testing is intact to soft touch on all 4 extremities. No evidence of extinction is noted.  Coordination: Cerebellar testing reveals good finger-nose-finger and heel-to-shin bilaterally.  Gait and station: Gait is normal.  Reflexes: Deep tendon reflexes are symmetric and normal bilaterally.   DIAGNOSTIC DATA (LABS, IMAGING, TESTING) - I reviewed patient records, labs, notes, testing and imaging myself where available.  No flowsheet data found.   Lab Results  Component Value Date   WBC 10.8 (H) 12/20/2018   HGB 14.6 12/20/2018   HCT 44.7 12/20/2018   MCV 97.8 12/20/2018   PLT 377 12/20/2018      Component Value Date/Time   NA 139 12/20/2018 1517   NA 144 03/22/2017 1015   K 5.2 (H) 12/20/2018 1517   CL 101 12/20/2018 1517   CO2 25 12/20/2018 1517   GLUCOSE 112 (H) 12/20/2018 1517   BUN 19 12/20/2018 1517   BUN 13 03/22/2017 1015    CREATININE 1.53 (H) 12/20/2018 1517   CALCIUM 9.5 12/20/2018 1517   PROT 8.6 (H) 12/20/2018 1517   PROT 8.4 03/22/2017 1015   ALBUMIN 4.5 12/20/2018 1517   ALBUMIN 4.4 03/22/2017 1015   AST 25 12/20/2018 1517   ALT 18 12/20/2018 1517   ALKPHOS 74 12/20/2018 1517   BILITOT 0.8 12/20/2018 1517   BILITOT 0.3 03/22/2017 1015   GFRNONAA 49 (L) 12/20/2018 1517   GFRAA 56 (L) 12/20/2018 1517   No results found for: CHOL, HDL, LDLCALC, LDLDIRECT, TRIG, CHOLHDL Lab Results  Component Value Date   HGBA1C 5.8 (H) 03/27/2016   Lab Results  Component Value Date   [id number] 03/27/2016   Lab Results  Component Value Date   TSH 1.107 10/01/2018       ASSESSMENT AND PLAN 59 y.o. year old male  has a past medical history of Anxiety, Back pain, CKD (chronic kidney disease), stage II, Depression, Hypertension, Language barrier (03-01-13), Seizures ([place]), and TBI (traumatic brain injury) ([place]). here with     ICD-10-CM   1. Seizure ([place])  R56.9     Mr Arroyos is doing well. He has not had any recent seizure activity. He will continue levetiracetam 1500mg  and phenytoin 200mg  twice daily. Prescription was refilled by Dr Jeanie Cooks. I have called pharmacy to cancel refills sent by [place] today to avoid duplication. I will update labs as patient and brother do not think these have been checked recently. Seizure precautions reviewed. He was advised to follow up closely with PCP for HTN and anxiety/depression. He will return in 6 months, sooner if needed.    No orders of the defined types were placed in this encounter.    No orders of the defined types were placed in this encounter.     I spent 20 minutes with the patient. 50% of this time was spent counseling and educating patient on plan of care and medications.    [name], FNP-C 04/11/2019, 2:32 PM [facility] [address], [place],  [place] [address] [phone]

## 2019-04-11 NOTE — Patient Instructions (Signed)
Continue current treatment with phenytoin 200mg  and levetiracetam 1500mg  twice daily   Follow up in 6 months   Seizure, Adult A seizure is a sudden burst of abnormal electrical activity in the brain. Seizures usually last from 30 seconds to 2 minutes. They can cause many different symptoms. Usually, seizures are not harmful unless they last a long time. What are the causes? Common causes of this condition include:  Fever or infection.  Conditions that affect the brain, such as: ? A brain abnormality that you were born with. ? A brain or head injury. ? Bleeding in the brain. ? A tumor. ? Stroke. ? Brain disorders such as autism or cerebral palsy.  Low blood sugar.  Conditions that are passed from parent to child (are inherited).  Problems with substances, such as: ? Having a reaction to a drug or a medicine. ? Suddenly stopping the use of a substance (withdrawal). In some cases, the cause may not be known. A person who has repeated seizures over time without a clear cause has a condition called epilepsy. What increases the risk? You are more likely to get this condition if you have:  A family history of epilepsy.  Had a seizure in the past.  A brain disorder.  A history of head injury, lack of oxygen at birth, or strokes. What are the signs or symptoms? There are many types of seizures. The symptoms vary depending on the type of seizure you have. Examples of symptoms during a seizure include:  Shaking (convulsions).  Stiffness in the body.  Passing out (losing consciousness).  Head nodding.  Staring.  Not responding to sound or touch.  Loss of bladder control and bowel control. Some people have symptoms right before and right after a seizure happens. Symptoms before a seizure may include:  Fear.  Worry (anxiety).  Feeling like you may vomit (nauseous).  Feeling like the room is spinning (vertigo).  Feeling like you saw or heard something before (dj  vu).  Odd tastes or smells.  Changes in how you see. You may see flashing lights or spots. Symptoms after a seizure happens can include:  Confusion.  Sleepiness.  Headache.  Weakness on one side of the body. How is this treated? Most seizures will stop on their own in under 5 minutes. In these cases, no treatment is needed. Seizures that last longer than 5 minutes will usually need treatment. Treatment can include:  Medicines given through an IV tube.  Avoiding things that are known to cause your seizures. These can include medicines that you take for another condition.  Medicines to treat epilepsy.  Surgery to stop the seizures. This may be needed if medicines do not help. Follow these instructions at home: Medicines  Take over-the-counter and prescription medicines only as told by your doctor.  Do not eat or drink anything that may keep your medicine from working, such as alcohol. Activity  Do not do any activities that would be dangerous if you had another seizure, like driving or swimming. Wait until your doctor says it is safe for you to do them.  If you live in the [place] ask your local DMV (department of motor vehicles) when you can drive.  Get plenty of rest. Teaching others Teach friends and family what to do when you have a seizure. They should:  Lay you on the ground.  Protect your head and body.  Loosen any tight clothing around your neck.  Turn you on your side.  Not hold  you down.  Not put anything into your mouth.  Know whether or not you need emergency care.  Stay with you until you are better.  General instructions  Contact your doctor each time you have a seizure.  Avoid anything that gives you seizures.  Keep a seizure diary. Write down: ? What you think caused each seizure. ? What you remember about each seizure.  Keep all follow-up visits as told by your doctor. This is important. Contact a doctor if:  You have another  seizure.  You have seizures more often.  There is any change in what happens during your seizures.  You keep having seizures with treatment.  You have symptoms of being sick or having an infection. Get help right away if:  You have a seizure that: ? Lasts longer than 5 minutes. ? Is different than seizures you had before. ? Makes it harder to breathe. ? Happens after you hurt your head.  You have any of these symptoms after a seizure: ? Not being able to speak. ? Not being able to use a part of your body. ? Confusion. ? A bad headache.  You have two or more seizures in a row.  You do not wake up right after a seizure.  You get hurt during a seizure. These symptoms may be an emergency. Do not wait to see if the symptoms will go away. Get medical help right away. Call your local emergency services (911 in the [place] Do not drive yourself to the hospital. Summary  Seizures usually last from 30 seconds to 2 minutes. Usually, they are not harmful unless they last a long time.  Do not eat or drink anything that may keep your medicine from working, such as alcohol.  Teach friends and family what to do when you have a seizure.  Contact your doctor each time you have a seizure. This information is not intended to replace advice given to you by your health care provider. Make sure you discuss any questions you have with your health care provider. Document Revised: 05/06/2018 Document Reviewed: 05/06/2018 Elsevier Patient Education  [place].

## 2019-04-12 LAB — COMPREHENSIVE METABOLIC PANEL
ALT: 13 IU/L (ref 0–44)
AST: 17 IU/L (ref 0–40)
Albumin/Globulin Ratio: 1.2 (ref 1.2–2.2)
Albumin: 4.2 g/dL (ref 3.8–4.9)
Alkaline Phosphatase: 93 IU/L (ref 39–117)
BUN/Creatinine Ratio: 9 — ABNORMAL LOW (ref 10–24)
BUN: 12 mg/dL (ref 8–27)
Bilirubin Total: <0.2 mg/dL (ref 0.0–1.2)
CO2: 24 mmol/L (ref 20–29)
Calcium: 9.3 mg/dL (ref 8.6–10.2)
Chloride: 102 mmol/L (ref 96–106)
Creatinine, Ser: 1.3 mg/dL — ABNORMAL HIGH (ref 0.76–1.27)
GFR calc Af Amer: 69 mL/min/{1.73_m2} (ref >59)
GFR calc non Af Amer: 59 mL/min/{1.73_m2} — ABNORMAL LOW (ref >59)
Globulin, Total: 3.5 g/dL (ref 1.5–4.5)
Glucose: 112 mg/dL — ABNORMAL HIGH (ref 65–99)
Potassium: 3.8 mmol/L (ref 3.5–5.2)
Sodium: 142 mmol/L (ref 134–144)
Total Protein: 7.7 g/dL (ref 6.0–8.5)

## 2019-04-12 LAB — PHENYTOIN LEVEL, TOTAL: Phenytoin (Dilantin), Serum: 29.2 ug/mL (ref 10.0–20.0)

## 2019-05-03 NOTE — Progress Notes (Signed)
I have reviewed and agreed above plan. 

## 2019-05-27 ENCOUNTER — Ambulatory Visit: Payer: Medicare Other | Attending: Internal Medicine

## 2019-05-27 DIAGNOSIS — Z23 Encounter for immunization: Secondary | ICD-10-CM

## 2019-05-27 NOTE — Progress Notes (Signed)
   Covid-19 Vaccination Clinic  Name:  [name]    MRN: [id number] DOB: 1960-07-11  05/27/2019  [name] was observed post Covid-19 immunization for 15 minutes without incident. He was provided with Vaccine Information Sheet and instruction to access the V-Safe system.   [name] was instructed to call 911 with any severe reactions post vaccine: [name] Difficulty breathing  . Swelling of face and throat  . A fast heartbeat  . A bad rash all over body  . Dizziness and weakness

## 2019-06-02 DIAGNOSIS — F329 Major depressive disorder, single episode, unspecified: Secondary | ICD-10-CM | POA: Diagnosis not present

## 2019-06-02 DIAGNOSIS — F439 Reaction to severe stress, unspecified: Secondary | ICD-10-CM | POA: Diagnosis not present

## 2019-06-30 DIAGNOSIS — G40909 Epilepsy, unspecified, not intractable, without status epilepticus: Secondary | ICD-10-CM | POA: Diagnosis not present

## 2019-06-30 DIAGNOSIS — I10 Essential (primary) hypertension: Secondary | ICD-10-CM | POA: Diagnosis not present

## 2019-06-30 DIAGNOSIS — J302 Other seasonal allergic rhinitis: Secondary | ICD-10-CM | POA: Diagnosis not present

## 2019-06-30 DIAGNOSIS — N400 Enlarged prostate without lower urinary tract symptoms: Secondary | ICD-10-CM | POA: Diagnosis not present

## 2019-07-02 ENCOUNTER — Observation Stay (HOSPITAL_COMMUNITY)
Admission: EM | Admit: 2019-07-02 | Discharge: 2019-07-03 | Disposition: A | Discharge status: Home / Self Care | Payer: Medicare Other | Attending: Internal Medicine | Admitting: Internal Medicine

## 2019-07-02 ENCOUNTER — Emergency Department (HOSPITAL_COMMUNITY): Payer: Medicare Other

## 2019-07-02 ENCOUNTER — Other Ambulatory Visit: Payer: Self-pay

## 2019-07-02 DIAGNOSIS — I129 Hypertensive chronic kidney disease with stage 1 through stage 4 chronic kidney disease, or unspecified chronic kidney disease: Secondary | ICD-10-CM | POA: Diagnosis not present

## 2019-07-02 DIAGNOSIS — Z79899 Other long term (current) drug therapy: Secondary | ICD-10-CM | POA: Insufficient documentation

## 2019-07-02 DIAGNOSIS — S0990XA Unspecified injury of head, initial encounter: Secondary | ICD-10-CM | POA: Diagnosis not present

## 2019-07-02 DIAGNOSIS — S199XXA Unspecified injury of neck, initial encounter: Secondary | ICD-10-CM | POA: Diagnosis not present

## 2019-07-02 DIAGNOSIS — W19XXXA Unspecified fall, initial encounter: Secondary | ICD-10-CM

## 2019-07-02 DIAGNOSIS — M79605 Pain in left leg: Secondary | ICD-10-CM | POA: Diagnosis not present

## 2019-07-02 DIAGNOSIS — I10 Essential (primary) hypertension: Secondary | ICD-10-CM | POA: Diagnosis not present

## 2019-07-02 DIAGNOSIS — S00531A Contusion of lip, initial encounter: Secondary | ICD-10-CM | POA: Diagnosis not present

## 2019-07-02 DIAGNOSIS — S8992XA Unspecified injury of left lower leg, initial encounter: Secondary | ICD-10-CM | POA: Diagnosis not present

## 2019-07-02 DIAGNOSIS — R52 Pain, unspecified: Secondary | ICD-10-CM | POA: Diagnosis not present

## 2019-07-02 DIAGNOSIS — Z87820 Personal history of traumatic brain injury: Secondary | ICD-10-CM | POA: Diagnosis not present

## 2019-07-02 DIAGNOSIS — Z20822 Contact with and (suspected) exposure to covid-19: Secondary | ICD-10-CM | POA: Insufficient documentation

## 2019-07-02 DIAGNOSIS — F419 Anxiety disorder, unspecified: Secondary | ICD-10-CM | POA: Insufficient documentation

## 2019-07-02 DIAGNOSIS — W109XXA Fall (on) (from) unspecified stairs and steps, initial encounter: Secondary | ICD-10-CM | POA: Diagnosis not present

## 2019-07-02 DIAGNOSIS — Z87891 Personal history of nicotine dependence: Secondary | ICD-10-CM | POA: Diagnosis not present

## 2019-07-02 DIAGNOSIS — Z03818 Encounter for observation for suspected exposure to other biological agents ruled out: Secondary | ICD-10-CM | POA: Diagnosis not present

## 2019-07-02 DIAGNOSIS — S0083XA Contusion of other part of head, initial encounter: Secondary | ICD-10-CM | POA: Diagnosis not present

## 2019-07-02 DIAGNOSIS — I6381 Other cerebral infarction due to occlusion or stenosis of small artery: Secondary | ICD-10-CM | POA: Diagnosis not present

## 2019-07-02 DIAGNOSIS — M542 Cervicalgia: Secondary | ICD-10-CM | POA: Diagnosis not present

## 2019-07-02 DIAGNOSIS — N183 Chronic kidney disease, stage 3 unspecified: Secondary | ICD-10-CM | POA: Diagnosis present

## 2019-07-02 DIAGNOSIS — R519 Headache, unspecified: Secondary | ICD-10-CM | POA: Diagnosis not present

## 2019-07-02 DIAGNOSIS — F32A Depression, unspecified: Secondary | ICD-10-CM | POA: Diagnosis present

## 2019-07-02 DIAGNOSIS — G40909 Epilepsy, unspecified, not intractable, without status epilepticus: Principal | ICD-10-CM | POA: Insufficient documentation

## 2019-07-02 DIAGNOSIS — F329 Major depressive disorder, single episode, unspecified: Secondary | ICD-10-CM | POA: Insufficient documentation

## 2019-07-02 DIAGNOSIS — N1831 Chronic kidney disease, stage 3a: Secondary | ICD-10-CM | POA: Diagnosis not present

## 2019-07-02 LAB — COMPREHENSIVE METABOLIC PANEL
ALT: 15 U/L (ref 0–44)
AST: 27 U/L (ref 15–41)
Albumin: 4 g/dL (ref 3.5–5.0)
Alkaline Phosphatase: 69 U/L (ref 38–126)
Anion gap: 5 (ref 5–15)
BUN: 19 mg/dL (ref 6–20)
CO2: 28 mmol/L (ref 22–32)
Calcium: 9.1 mg/dL (ref 8.9–10.3)
Chloride: 103 mmol/L (ref 98–111)
Creatinine, Ser: 1.31 mg/dL — ABNORMAL HIGH (ref 0.61–1.24)
GFR calc Af Amer: >60 mL/min (ref >60)
GFR calc non Af Amer: 59 mL/min — ABNORMAL LOW (ref >60)
Glucose, Bld: 115 mg/dL — ABNORMAL HIGH (ref 70–99)
Potassium: 4.7 mmol/L (ref 3.5–5.1)
Sodium: 136 mmol/L (ref 135–145)
Total Bilirubin: 0.7 mg/dL (ref 0.3–1.2)
Total Protein: 7.7 g/dL (ref 6.5–8.1)

## 2019-07-02 LAB — CBC WITH DIFFERENTIAL/PLATELET
Abs Immature Granulocytes: 0.03 10*3/uL (ref 0.00–0.07)
Basophils Absolute: 0 10*3/uL (ref 0.0–0.1)
Basophils Relative: 0 %
Eosinophils Absolute: 0.4 10*3/uL (ref 0.0–0.5)
Eosinophils Relative: 4 %
HCT: 40.9 % (ref 39.0–52.0)
Hemoglobin: 13.5 g/dL (ref 13.0–17.0)
Immature Granulocytes: 0 %
Lymphocytes Relative: 18 %
Lymphs Abs: 1.7 10*3/uL (ref 0.7–4.0)
MCH: 31.2 pg (ref 26.0–34.0)
MCHC: 33 g/dL (ref 30.0–36.0)
MCV: 94.5 fL (ref 80.0–100.0)
Monocytes Absolute: 1 10*3/uL (ref 0.1–1.0)
Monocytes Relative: 10 %
Neutro Abs: 6.4 10*3/uL (ref 1.7–7.7)
Neutrophils Relative %: 68 %
Platelets: 309 10*3/uL (ref 150–400)
RBC: 4.33 MIL/uL (ref 4.22–5.81)
RDW: 13.2 % (ref 11.5–15.5)
WBC: 9.5 10*3/uL (ref 4.0–10.5)
nRBC: 0 % (ref 0.0–0.2)

## 2019-07-02 LAB — RAPID URINE DRUG SCREEN, HOSP PERFORMED
Amphetamines: NOT DETECTED
Barbiturates: NOT DETECTED
Benzodiazepines: NOT DETECTED
Cocaine: NOT DETECTED
Opiates: NOT DETECTED
Tetrahydrocannabinol: NOT DETECTED

## 2019-07-02 LAB — URINALYSIS, ROUTINE W REFLEX MICROSCOPIC
Bilirubin Urine: NEGATIVE
Glucose, UA: NEGATIVE mg/dL
Ketones, ur: NEGATIVE mg/dL
Leukocytes,Ua: NEGATIVE
Nitrite: NEGATIVE
Protein, ur: NEGATIVE mg/dL
Specific Gravity, Urine: 1.018 (ref 1.005–1.030)
pH: 5 (ref 5.0–8.0)

## 2019-07-02 LAB — ETHANOL: Alcohol, Ethyl (B): <10 mg/dL (ref <10)

## 2019-07-02 LAB — CBG MONITORING, ED: Glucose-Capillary: 100 mg/dL — ABNORMAL HIGH (ref 70–99)

## 2019-07-02 LAB — MAGNESIUM: Magnesium: 1.9 mg/dL (ref 1.7–2.4)

## 2019-07-02 LAB — PHENYTOIN LEVEL, TOTAL: Phenytoin Lvl: 24.9 ug/mL — ABNORMAL HIGH (ref 10.0–20.0)

## 2019-07-02 MED ORDER — LEVETIRACETAM 500 MG PO TABS
1500.0000 mg | ORAL_TABLET | Freq: Two times a day (BID) | ORAL | Status: DC
  Administered 2019-07-03 (×2): 1500 mg via ORAL
  Filled 2019-07-02 (×2): qty 3

## 2019-07-02 MED ORDER — SODIUM CHLORIDE 0.9 % IV BOLUS
1000.0000 mL | Freq: Once | INTRAVENOUS | Status: AC
  Administered 2019-07-02: 1000 mL via INTRAVENOUS

## 2019-07-02 MED ORDER — LEVETIRACETAM IN NACL 1000 MG/100ML IV SOLN
1000.0000 mg | Freq: Once | INTRAVENOUS | Status: AC
  Administered 2019-07-02: 1000 mg via INTRAVENOUS
  Filled 2019-07-02: qty 100

## 2019-07-02 MED ORDER — TETANUS-DIPHTH-ACELL PERTUSSIS 5-2.5-18.5 LF-MCG/0.5 IM SUSP
0.5000 mL | Freq: Once | INTRAMUSCULAR | Status: AC
  Administered 2019-07-02: 0.5 mL via INTRAMUSCULAR
  Filled 2019-07-02: qty 0.5

## 2019-07-02 MED ORDER — ONDANSETRON HCL 4 MG/2ML IJ SOLN: 4.0000 mg | Freq: Four times a day (QID) | INTRAMUSCULAR | Status: DC | PRN

## 2019-07-02 MED ORDER — ENOXAPARIN SODIUM 40 MG/0.4ML ~~LOC~~ SOLN
40.0000 mg | Freq: Every day | SUBCUTANEOUS | Status: DC
  Administered 2019-07-03: 40 mg via SUBCUTANEOUS
  Filled 2019-07-02: qty 0.4

## 2019-07-02 MED ORDER — ACETAMINOPHEN 325 MG PO TABS
650.0000 mg | ORAL_TABLET | ORAL | Status: DC | PRN
  Administered 2019-07-03 (×3): 650 mg via ORAL
  Filled 2019-07-02 (×3): qty 2

## 2019-07-02 MED ORDER — ACETAMINOPHEN 650 MG RE SUPP: 650.0000 mg | RECTAL | Status: DC | PRN

## 2019-07-02 MED ORDER — ONDANSETRON HCL 4 MG PO TABS: 4.0000 mg | ORAL_TABLET | Freq: Four times a day (QID) | ORAL | Status: DC | PRN

## 2019-07-02 MED ORDER — PHENYTOIN 50 MG PO CHEW
100.0000 mg | CHEWABLE_TABLET | Freq: Three times a day (TID) | ORAL | Status: DC
  Administered 2019-07-03 (×2): 100 mg via ORAL
  Filled 2019-07-02 (×2): qty 2

## 2019-07-02 MED ORDER — LORAZEPAM 2 MG/ML IJ SOLN: 1.0000 mg | INTRAMUSCULAR | Status: DC | PRN

## 2019-07-02 NOTE — ED Notes (Signed)
PTs brother is POC [phone].

## 2019-07-02 NOTE — ED Notes (Signed)
Attempted to contact translator to tell pt we needed urine. Unable to get translator at this time.

## 2019-07-02 NOTE — ED Triage Notes (Signed)
Pt speaks [place]agnard.

## 2019-07-02 NOTE — ED Provider Notes (Signed)
[facility] DEPT Provider Note   CSN: [id number] Arrival date & time: 07/02/19  1709     History No chief complaint on file.   [name] is a 59 y.o. male past med history anxiety, back pain, CKD, depression, hypertension who presents for evaluation of fall.  Difficulty obtaining history secondary to language barrier.   The history is provided by the EMS personnel.       Past Medical History:  Diagnosis Date  . Anxiety   . Back pain    low back pain/ compressed disk in lower back.   . CKD (chronic kidney disease), stage II   . Depression   . Hypertension   . Language barrier 03-01-13   Speaks very little English,primary-Vietnamese "[name] Dialect"  . Seizures ([place])    due head trauma-seizures are less, but occurs from mild to more severe, which causes agitation to mental state.  [name] TBI (traumatic brain injury) ([place])    CARE FOR BY BROTHER    Patient Active Problem List   Diagnosis Date Noted  . Fall 07/02/2019  . Cognitive impairment 01/10/2019  . Prolonged seizure ([place]) 09/30/2018  . Noncompliance with medication regimen 11/22/2017  . Altered mental status 03/27/2016  . Sepsis ([place]) 07/03/2015  . CKD (chronic kidney disease), stage II   . Abdominal pain   . Seizures ([place])   . Encounter for therapeutic drug monitoring 01/03/2014  . Partial epilepsy with impairment of consciousness, intractable ([place]) 03/13/2013  . 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 Encounter for long-term (current) use of other medications 03/13/2013  . Weakness generalized 08/05/2012  . Pneumonia 08/04/2012  . HTN (hypertension) 08/04/2012  . Seizure ([place]) 08/04/2012  . Depression 08/04/2012  . Lower extremity pain 12/21/2011  . Dilantin toxicity 12/19/2011  . Encephalopathy 12/19/2011  . Nicole Kindred will get Wo within enhancing 9 101 which it now 500 or iron ad lib. there is no improvement in right 12/18/2011  . Seizure  ([place]) 12/17/2011  . Altered mental state 12/17/2011    Past Surgical History:  Procedure Laterality Date  . ABDOMINAL SURGERY    . COLONOSCOPY N/A 03/17/2013   Procedure: COLONOSCOPY;  Surgeon: [name], MD;  Location: [facility] ENDOSCOPY;  Service: Endoscopy;  Laterality: N/A;  . NO PAST SURGERIES     ? one surgery in [place]-? what, has abdominal scar.       Family History  Problem Relation Age of Onset  . Seizures Mother        per his nonbiological brother    Social History   Tobacco Use  . Smoking status: Former Smoker    Quit date: 03/02/1989    Years since quitting: 30.3  . Smokeless tobacco: Never Used  Substance Use Topics  . Alcohol use: No  . Drug use: No    Home Medications Prior to Admission medications   Medication Sig Start Date End Date Taking? Authorizing Provider  DICLOFENAC PO Take 75 mg by mouth as needed.    [provider]  FLUoxetine (PROZAC) 40 MG capsule Take 40 mg by mouth daily.    [provider]  levETIRAcetam (KEPPRA) 750 MG tablet Take 2 tablets (1,500 mg total) by mouth 2 (two) times daily. 04/11/19 05/11/19  Lomax, Amy, NP  phenytoin (DILANTIN) 200 MG ER capsule Take 1 capsule (200 mg total) by mouth 2 (two) times daily. 04/11/19   [name], NP    Allergies    Patient has no known  allergies.  Review of Systems   Review of Systems  Unable to perform ROS: Other    Physical Exam Updated Vital Signs BP (!) 163/92   Pulse 87   Temp 99.5 F (37.5 C) (Oral)   Resp 19   SpO2 100%   Physical Exam Vitals and nursing note reviewed.  Constitutional:      Appearance: Normal appearance. He is well-developed.  HENT:     Head: Normocephalic and atraumatic.      Comments: No tenderness to palpation of skull. No deformities or crepitus noted. No open wounds, abrasions or lacerations.  Contusion noted to upper lip.  No laceration or open wound.  No tenderness palpation noted to dentition.  He has a slight contusion noted to  the left mandible.  No deformity or crepitus noted. Eyes:     General: Lids are normal.     Conjunctiva/sclera: Conjunctivae normal.     Pupils: Pupils are equal, round, and reactive to light.     Comments: PERRL. EOMs intact. No nystagmus. No neglect.   Neck:     Comments: Full range of motion without any difficulty. Cardiovascular:     Rate and Rhythm: Normal rate and regular rhythm.     Pulses: Normal pulses.     Heart sounds: Normal heart sounds. No murmur. No friction rub. No gallop.   Pulmonary:     Effort: Pulmonary effort is normal.     Breath sounds: Normal breath sounds.     Comments: Lungs clear to auscultation bilaterally.  Symmetric chest rise.  No wheezing, rales, rhonchi. Abdominal:     Palpations: Abdomen is soft. Abdomen is not rigid.     Tenderness: There is no abdominal tenderness. There is no guarding.     Comments: Abdomen is soft, non-distended, non-tender. No rigidity, No guarding. No peritoneal signs.  Musculoskeletal:        General: Normal range of motion.     Comments: Tenderness palpation of the left tib-fib.  No deformity or crepitus noted.  He is able to flex and extend the left lower extremity with any difficulty.  Skin:    General: Skin is warm and dry.     Capillary Refill: Capillary refill takes less than 2 seconds.  Neurological:     Mental Status: He is alert and oriented to person, place, and time.     Comments: Moves all extremities.   Psychiatric:        Speech: Speech normal.     ED Results / Procedures / Treatments   Labs (all labs ordered are listed, but only abnormal results are displayed) Labs Reviewed  COMPREHENSIVE METABOLIC PANEL - Abnormal; Notable for the following components:      Result Value   Glucose, Bld 115 (*)    Creatinine, Ser 1.31 (*)    GFR calc non Af Amer 59 (*)    All other components within normal limits  PHENYTOIN LEVEL, TOTAL - Abnormal; Notable for the following components:   Phenytoin Lvl 24.9 (*)     All other components within normal limits  URINALYSIS, ROUTINE W REFLEX MICROSCOPIC - Abnormal; Notable for the following components:   Hgb urine dipstick SMALL (*)    Bacteria, UA RARE (*)    All other components within normal limits  CBG MONITORING, ED - Abnormal; Notable for the following components:   Glucose-Capillary 100 (*)    All other components within normal limits  CBC WITH DIFFERENTIAL/PLATELET  MAGNESIUM  ETHANOL  RAPID URINE DRUG  SCREEN, HOSP PERFORMED  LEVETIRACETAM LEVEL    EKG None  Radiology DG Tibia/Fibula Left  Result Date: 07/02/2019 CLINICAL DATA:  Pain status post fall EXAM: LEFT TIBIA AND FIBULA - 2 VIEW COMPARISON:  None. FINDINGS: There is no evidence of fracture or other focal bone lesions. Soft tissues are unremarkable. IMPRESSION: Negative. Electronically Signed   By: Constance Holster M.D.   On: 07/02/2019 18:32   CT Head Wo Contrast  Result Date: 07/02/2019 CLINICAL DATA:  Slip and fall, pain EXAM: CT HEAD WITHOUT CONTRAST CT MAXILLOFACIAL WITHOUT CONTRAST CT CERVICAL SPINE WITHOUT CONTRAST TECHNIQUE: Multidetector CT imaging of the head, cervical spine, and maxillofacial structures were performed using the standard protocol without intravenous contrast. Multiplanar CT image reconstructions of the cervical spine and maxillofacial structures were also generated. COMPARISON:  None. FINDINGS: CT HEAD FINDINGS Brain: No evidence of acute infarction, hemorrhage, hydrocephalus, extra-axial collection or mass lesion/mass effect. Periventricular white matter hypodensity. Small lacunar infarctions of the bilateral basal ganglia and corona radiata. Vascular: No hyperdense vessel or unexpected calcification. CT FACIAL BONES FINDINGS Skull: Normal. Negative for fracture or focal lesion. Facial bones: No displaced fractures or dislocations. Sinuses/Orbits: Mucosal thickening of the maxillary sinuses and ethmoid air cells. Other: Soft tissue contusion of the lips. CT CERVICAL  SPINE FINDINGS Alignment: Positional straightening of the normal cervical lordosis. Skull base and vertebrae: No acute fracture. No primary bone lesion or focal pathologic process. Soft tissues and spinal canal: No prevertebral fluid or swelling. No visible canal hematoma. Disc levels: Mild multilevel disc space height loss and osteophytosis. Upper chest: Negative. Other: None. IMPRESSION: 1.  No acute intracranial pathology. 2. Small-vessel white matter disease. Small, nonacute lacunar infarctions bilaterally. 3.  Soft tissue contusion of the lips. 4.  No fracture or static subluxation of the cervical spine. Electronically Signed   By: Eddie Candle M.D.   On: 07/02/2019 18:41   CT Cervical Spine Wo Contrast  Result Date: 07/02/2019 CLINICAL DATA:  Slip and fall, pain EXAM: CT HEAD WITHOUT CONTRAST CT MAXILLOFACIAL WITHOUT CONTRAST CT CERVICAL SPINE WITHOUT CONTRAST TECHNIQUE: Multidetector CT imaging of the head, cervical spine, and maxillofacial structures were performed using the standard protocol without intravenous contrast. Multiplanar CT image reconstructions of the cervical spine and maxillofacial structures were also generated. COMPARISON:  None. FINDINGS: CT HEAD FINDINGS Brain: No evidence of acute infarction, hemorrhage, hydrocephalus, extra-axial collection or mass lesion/mass effect. Periventricular white matter hypodensity. Small lacunar infarctions of the bilateral basal ganglia and corona radiata. Vascular: No hyperdense vessel or unexpected calcification. CT FACIAL BONES FINDINGS Skull: Normal. Negative for fracture or focal lesion. Facial bones: No displaced fractures or dislocations. Sinuses/Orbits: Mucosal thickening of the maxillary sinuses and ethmoid air cells. Other: Soft tissue contusion of the lips. CT CERVICAL SPINE FINDINGS Alignment: Positional straightening of the normal cervical lordosis. Skull base and vertebrae: No acute fracture. No primary bone lesion or focal pathologic  process. Soft tissues and spinal canal: No prevertebral fluid or swelling. No visible canal hematoma. Disc levels: Mild multilevel disc space height loss and osteophytosis. Upper chest: Negative. Other: None. IMPRESSION: 1.  No acute intracranial pathology. 2. Small-vessel white matter disease. Small, nonacute lacunar infarctions bilaterally. 3.  Soft tissue contusion of the lips. 4.  No fracture or static subluxation of the cervical spine. Electronically Signed   By: Eddie Candle M.D.   On: 07/02/2019 18:41   CT Maxillofacial Wo Contrast  Result Date: 07/02/2019 CLINICAL DATA:  Slip and fall, pain EXAM: CT HEAD WITHOUT CONTRAST CT  MAXILLOFACIAL WITHOUT CONTRAST CT CERVICAL SPINE WITHOUT CONTRAST TECHNIQUE: Multidetector CT imaging of the head, cervical spine, and maxillofacial structures were performed using the standard protocol without intravenous contrast. Multiplanar CT image reconstructions of the cervical spine and maxillofacial structures were also generated. COMPARISON:  None. FINDINGS: CT HEAD FINDINGS Brain: No evidence of acute infarction, hemorrhage, hydrocephalus, extra-axial collection or mass lesion/mass effect. Periventricular white matter hypodensity. Small lacunar infarctions of the bilateral basal ganglia and corona radiata. Vascular: No hyperdense vessel or unexpected calcification. CT FACIAL BONES FINDINGS Skull: Normal. Negative for fracture or focal lesion. Facial bones: No displaced fractures or dislocations. Sinuses/Orbits: Mucosal thickening of the maxillary sinuses and ethmoid air cells. Other: Soft tissue contusion of the lips. CT CERVICAL SPINE FINDINGS Alignment: Positional straightening of the normal cervical lordosis. Skull base and vertebrae: No acute fracture. No primary bone lesion or focal pathologic process. Soft tissues and spinal canal: No prevertebral fluid or swelling. No visible canal hematoma. Disc levels: Mild multilevel disc space height loss and osteophytosis. Upper  chest: Negative. Other: None. IMPRESSION: 1.  No acute intracranial pathology. 2. Small-vessel white matter disease. Small, nonacute lacunar infarctions bilaterally. 3.  Soft tissue contusion of the lips. 4.  No fracture or static subluxation of the cervical spine. Electronically Signed   By: Eddie Candle M.D.   On: 07/02/2019 18:41    Procedures Procedures (including critical care time)  Medications Ordered in ED Medications  Tdap (BOOSTRIX) injection 0.5 mL (0.5 mLs Intramuscular Given 07/02/19 1902)  levETIRAcetam (KEPPRA) IVPB 1000 mg/100 mL premix (0 mg Intravenous Stopped 07/02/19 1936)  sodium chloride 0.9 % bolus 1,000 mL (0 mLs Intravenous Stopped 07/02/19 2045)    ED Course  I have reviewed the triage vital signs and the nursing notes.  Pertinent labs & imaging results that were available during my care of the patient were reviewed by me and considered in my medical decision making (see chart for details).    MDM Rules/Calculators/A&P                      59 year old male past history of TBI, seizures who presents for evaluation of a fall.  Patient was brought in by EMS for evaluation of fall.  Per EMS, he slipped down 3 steps.  On initial ED arrival, he is afebrile, nontoxic-appearing.  Vital signs are stable.  He does have evidence of contusion noted to his left upper lip and left face.  He also is reporting pain to his tib-fib.  Unfortunately, patient does not speak [place].  Patient speaks [name].  I attempted using the language line, the iPad interpreter, the [place] language line and was unable to find an interpreter.  I was told that there was no interpreter for that language on at this time.  After multiple attempts of trying to find an interpreter that could discussed with patient, I was unsuccessful.  We did end up using a personal interpreter due to the nature of the situation.  Initially, patient had been active and alert and was talking and what I assumed was his native  language.  When I got the personal interpreter on the line, patient started spacing out and turning towards the left.  He would not answer any of the questions and just stared off.  This lasted for about 2 to 3 minutes.  He then went back to the following me and able to move all extremities but did seem slightly delayed may be postictal.  He does have  a history of seizures.  We will plan to give a loading dose of Keppra and evaluate for pathology.  CBG is 100.  Magnesium is 1.9.  Dilantin level is 24.9, ethanol levels unremarkable.  CBC shows no leukocytosis or anemia.  CMP shows BUN of 19, creatinine 1.31.  UA negative for any infectious etiology.  UDS negative.  CT head shows no acute intracranial abnormality.  He has small nonacute lacunar infarcts noted.  Soft contusion of lips.  No fracture of the C-spine or fracture of face.  Tib-fib negative.  At this time, patient appears more alert and is resting comfortably.  I attempted to get in contact with his brother.  Brother speaks very little English but is not able to come pick him up from the hospital.  At this time, do not know if he is back to baseline.  Additionally, question of seizure is what initially caused him to fall.  Because I was unable to obtain a history, I do not know what exactly caused his fall.  At this time, do not feel that patient is appropriate for discharge given that we have nobody who can correlate that he is at his baseline.  Will recommend overnight observation for safety.  Discussed patient with Dr. Posey Pronto (hospitalist) who recommends that I discussed with neuro.  Discussed with Dr. Leonel Ramsay (neuro).  His Dilantin level is slightly supra therapeutic which he states could cause him to fall and have seizures.  He recommends decreasing the Dilantin 200 mg 3 times daily and checking the levels until it gets into a therapeutic range.  At this time, he does not need to be transferred to [facility] but if he continues to have more  seizure episodes, you can consult Dr. Leonel Ramsay and can reassess the situation.  Discussed patient with Dr. Posey Pronto who accepts patient for admission.   Portions of this note were generated with Lobbyist. Dictation errors may occur despite best attempts at proofreading.  Final Clinical Impression(s) / ED Diagnoses Final diagnoses:  Fall, initial encounter    Rx / DC Orders ED Discharge Orders    None       Desma Mcgregor 07/02/19 2307    Sherwood Gambler, MD 07/03/19 1735

## 2019-07-02 NOTE — H&P (Signed)
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 male with medical history significant for TBI with cognitive impairment and seizure disorder, hypertension, CKD stage II-III, and depression who presents to the ED for evaluation after a fall at home.  History limited due to language barrier and no interpreter availability therefore is primarily obtained from [place] and chart review.  Patient is brought to the ED via EMS after he had a reported fall.  Per ED triage documentation he was walking down some steps and slipped down approximately 3 steps.  He was complaining of left-sided head and leg pain but did not lose consciousness.  At time of admission patient is resting in bed, awake, alert, and appropriately interactive.  He will understand and speak sparse English words but otherwise is speaking in presumably his primary language.  He is moving all extremities well and points to his left face and left shin when asked about pain.  Other relevant history is unable to be obtained due to the language barrier.  ED Course:  Initial vitals showed BP 121/68, pulse 94, RR 15, temp 99.5 Fahrenheit, SPO2 96% on room air.  Labs are notable for WBC 9.5, hemoglobin 13.5, platelets 309,000, sodium 136, potassium 4.7, bicarb 28, BUN 19, creatinine 1.3, AST 27, ALT 15, alk phos 69, total bilirubin 0.7, magnesium 1.9, serum ethanol level undetectable.  Phenytoin level is supratherapeutic at 24.9.  Levetiracetam level is in process.  UDS is negative.  Urinalysis is negative for UTI.  Left tibia and fibula x-rays negative for evidence of fracture or focal bone lesions.  CT head/maxillofacial/C-spine without contrast are negative for acute intracranial  pathology, fracture or static subluxation of the cervical spine.  Small vessel white matter disease and small nonacute lacunar infarcts bilaterally are noted as well as soft tissue contusion of the lips.  While in the ED, patient had an episode where he "started spacing out and turning towards the left" and would not answer questions being asked by the EDP will continue to stare off.  This reportedly lasted about 2-3 minutes before becoming more responsive.  He was felt to have delay in movement and possibly in postictal state.  Patient was given 1 L normal saline and 1 g IV Keppra.  EDP discussed with on-call neurology who recommended reducing home phenytoin dosing from 200 mg twice daily to 100 mg 3 times daily as supratherapeutic dosing may have caused patient to fall and have seizures.  Repeat level until in therapeutic range.  They recommended patient may be observed at [facility] long but if has any recurrent seizure-like activity to reconsult neurology.  The hospitalist service was consulted to admit for further evaluation management.  Review of Systems:  Unable to obtain full review of systems due to language barrier no interpreter available for patient's primary language/dialect.   Past Medical History:  Diagnosis Date  . Anxiety   . Back pain    low back pain/ compressed disk in lower back.   . CKD (chronic kidney disease), stage II   . Depression   . Hypertension   . Language barrier 03-01-13   Speaks very little English,primary-Vietnamese "[name] Dialect"  . Seizures ([place])    due head trauma-seizures are less, but  occurs from mild to more severe, which causes agitation to mental state.  [name] TBI (traumatic brain injury) ([place])    CARE FOR BY BROTHER    Past Surgical History:  Procedure Laterality Date  . ABDOMINAL SURGERY    . COLONOSCOPY N/A 03/17/2013   Procedure: COLONOSCOPY;  Surgeon: [name], MD;  Location: [facility] END[facility];  Service: Endoscopy;  Laterality: N/A;  . NO PAST  SURGERIES     ? one surgery in [place]-? what, has abdominal scar.    Social History:  reports that he quit smoking about 30 years ago. He has never used smokeless tobacco. He reports that he does not drink alcohol or use drugs.  No Known Allergies  Family History  Problem Relation Age of Onset  . Seizures Mother        per his nonbiological brother     Prior to Admission medications   Medication Sig Start Date End Date Taking? Authorizing Provider  DICLOFENAC PO Take 75 mg by mouth as needed.    [provider]  FLUoxetine (PROZAC) 40 MG capsule Take 40 mg by mouth daily.    [provider]  levETIRAcetam (KEPPRA) 750 MG tablet Take 2 tablets (1,500 mg total) by mouth 2 (two) times daily. 04/11/19 05/11/19  Lomax, Amy, NP  phenytoin (DILANTIN) 200 MG ER capsule Take 1 capsule (200 mg total) by mouth 2 (two) times daily. 04/11/19   [name], NP    Physical Exam: Vitals:   07/02/19 1717 07/02/19 1724 07/02/19 1945  BP:  121/68 (!) 163/92  Pulse:  94 87  Resp:  15 19  Temp:  99.5 F (37.5 C)   TempSrc:  Oral   SpO2: 96% 96% 100%   Constitutional: Resting supine in bed, NAD, calm, comfortable Eyes: PERRL, EOMI, lids and conjunctivae normal H ENMT: Slight swelling and erythema of the left face and lips with few small abrasions of the lips.  No open wounds or discharge. Neck: normal, supple, no masses. Respiratory: clear to auscultation bilaterally, no wheezing, no crackles. Normal respiratory effort. No accessory muscle use.  Cardiovascular: Regular rate and rhythm, no murmurs / rubs / gallops. No extremity edema. 2+ pedal pulses. Abdomen: no tenderness, no masses palpated. No hepatosplenomegaly. Bowel sounds positive.  Musculoskeletal: no clubbing / cyanosis. No joint deformity upper and lower extremities. Good ROM, no contractures. Normal muscle tone.  Skin: Few small abrasions of the lips Neurologic: Limited due to language barrier but CN 2-12 appear grossly  intact. Sensation intact, Strength 5/5 in all 4.  Psychiatric: Limited due to language barrier but he is awake, alert, speaking in his primary language with intermittent English words.  Labs on Admission: I have personally reviewed following labs and imaging studies  CBC: Recent Labs  Lab 07/02/19 1802  WBC 9.5  NEUTROABS 6.4  HGB 13.5  HCT 40.9  MCV 94.5  PLT 623   Basic Metabolic Panel: Recent Labs  Lab 07/02/19 1802  NA 136  K 4.7  CL 103  CO2 28  GLUCOSE 115*  BUN 19  CREATININE 1.31*  CALCIUM 9.1  MG 1.9   GFR: CrCl cannot be calculated (Unknown ideal weight.). Liver Function Tests: Recent Labs  Lab 07/02/19 1802  AST 27  ALT 15  ALKPHOS 69  BILITOT 0.7  PROT 7.7  ALBUMIN 4.0   No results for input(s): LIPASE, AMYLASE in the last 168 hours. No results for input(s): AMMONIA in the last 168 hours. Coagulation Profile: No results for input(s):  INR, PROTIME in the last 168 hours. Cardiac Enzymes: No results for input(s): CKTOTAL, CKMB, CKMBINDEX, TROPONINI in the last 168 hours. BNP (last 3 results) No results for input(s): PROBNP in the last 8760 hours. HbA1C: No results for input(s): HGBA1C in the last 72 hours. CBG: Recent Labs  Lab 07/02/19 1844  GLUCAP 100*   Lipid Profile: No results for input(s): CHOL, HDL, LDLCALC, TRIG, CHOLHDL, LDLDIRECT in the last 72 hours. Thyroid Function Tests: No results for input(s): TSH, T4TOTAL, FREET4, T3FREE, THYROIDAB in the last 72 hours. Anemia Panel: No results for input(s): VITAMINB12, FOLATE, FERRITIN, TIBC, IRON, RETICCTPCT in the last 72 hours. Urine analysis:    Component Value Date/Time   COLORURINE YELLOW 07/02/2019 1803   APPEARANCEUR CLEAR 07/02/2019 1803   LABSPEC 1.018 07/02/2019 1803   PHURINE 5.0 07/02/2019 1803   GLUCOSEU NEGATIVE 07/02/2019 1803   HGBUR SMALL (A) 07/02/2019 1803   BILIRUBINUR NEGATIVE 07/02/2019 1803   KETONESUR NEGATIVE 07/02/2019 1803   PROTEINUR NEGATIVE 07/02/2019  1803   UROBILINOGEN 0.2 08/04/2012 1429   NITRITE NEGATIVE 07/02/2019 1803   LEUKOCYTESUR NEGATIVE 07/02/2019 1803    Radiological Exams on Admission: DG Tibia/Fibula Left  Result Date: 07/02/2019 CLINICAL DATA:  Pain status post fall EXAM: LEFT TIBIA AND FIBULA - 2 VIEW COMPARISON:  None. FINDINGS: There is no evidence of fracture or other focal bone lesions. Soft tissues are unremarkable. IMPRESSION: Negative. Electronically Signed   By: Constance Holster M.D.   On: 07/02/2019 18:32   CT Head Wo Contrast  Result Date: 07/02/2019 CLINICAL DATA:  Slip and fall, pain EXAM: CT HEAD WITHOUT CONTRAST CT MAXILLOFACIAL WITHOUT CONTRAST CT CERVICAL SPINE WITHOUT CONTRAST TECHNIQUE: Multidetector CT imaging of the head, cervical spine, and maxillofacial structures were performed using the standard protocol without intravenous contrast. Multiplanar CT image reconstructions of the cervical spine and maxillofacial structures were also generated. COMPARISON:  None. FINDINGS: CT HEAD FINDINGS Brain: No evidence of acute infarction, hemorrhage, hydrocephalus, extra-axial collection or mass lesion/mass effect. Periventricular white matter hypodensity. Small lacunar infarctions of the bilateral basal ganglia and corona radiata. Vascular: No hyperdense vessel or unexpected calcification. CT FACIAL BONES FINDINGS Skull: Normal. Negative for fracture or focal lesion. Facial bones: No displaced fractures or dislocations. Sinuses/Orbits: Mucosal thickening of the maxillary sinuses and ethmoid air cells. Other: Soft tissue contusion of the lips. CT CERVICAL SPINE FINDINGS Alignment: Positional straightening of the normal cervical lordosis. Skull base and vertebrae: No acute fracture. No primary bone lesion or focal pathologic process. Soft tissues and spinal canal: No prevertebral fluid or swelling. No visible canal hematoma. Disc levels: Mild multilevel disc space height loss and osteophytosis. Upper chest: Negative. Other:  None. IMPRESSION: 1.  No acute intracranial pathology. 2. Small-vessel white matter disease. Small, nonacute lacunar infarctions bilaterally. 3.  Soft tissue contusion of the lips. 4.  No fracture or static subluxation of the cervical spine. Electronically Signed   By: Eddie Candle M.D.   On: 07/02/2019 18:41   CT Cervical Spine Wo Contrast  Result Date: 07/02/2019 CLINICAL DATA:  Slip and fall, pain EXAM: CT HEAD WITHOUT CONTRAST CT MAXILLOFACIAL WITHOUT CONTRAST CT CERVICAL SPINE WITHOUT CONTRAST TECHNIQUE: Multidetector CT imaging of the head, cervical spine, and maxillofacial structures were performed using the standard protocol without intravenous contrast. Multiplanar CT image reconstructions of the cervical spine and maxillofacial structures were also generated. COMPARISON:  None. FINDINGS: CT HEAD FINDINGS Brain: No evidence of acute infarction, hemorrhage, hydrocephalus, extra-axial collection or mass lesion/mass effect. Periventricular white matter  hypodensity. Small lacunar infarctions of the bilateral basal ganglia and corona radiata. Vascular: No hyperdense vessel or unexpected calcification. CT FACIAL BONES FINDINGS Skull: Normal. Negative for fracture or focal lesion. Facial bones: No displaced fractures or dislocations. Sinuses/Orbits: Mucosal thickening of the maxillary sinuses and ethmoid air cells. Other: Soft tissue contusion of the lips. CT CERVICAL SPINE FINDINGS Alignment: Positional straightening of the normal cervical lordosis. Skull base and vertebrae: No acute fracture. No primary bone lesion or focal pathologic process. Soft tissues and spinal canal: No prevertebral fluid or swelling. No visible canal hematoma. Disc levels: Mild multilevel disc space height loss and osteophytosis. Upper chest: Negative. Other: None. IMPRESSION: 1.  No acute intracranial pathology. 2. Small-vessel white matter disease. Small, nonacute lacunar infarctions bilaterally. 3.  Soft tissue contusion of the  lips. 4.  No fracture or static subluxation of the cervical spine. Electronically Signed   By: Eddie Candle M.D.   On: 07/02/2019 18:41   CT Maxillofacial Wo Contrast  Result Date: 07/02/2019 CLINICAL DATA:  Slip and fall, pain EXAM: CT HEAD WITHOUT CONTRAST CT MAXILLOFACIAL WITHOUT CONTRAST CT CERVICAL SPINE WITHOUT CONTRAST TECHNIQUE: Multidetector CT imaging of the head, cervical spine, and maxillofacial structures were performed using the standard protocol without intravenous contrast. Multiplanar CT image reconstructions of the cervical spine and maxillofacial structures were also generated. COMPARISON:  None. FINDINGS: CT HEAD FINDINGS Brain: No evidence of acute infarction, hemorrhage, hydrocephalus, extra-axial collection or mass lesion/mass effect. Periventricular white matter hypodensity. Small lacunar infarctions of the bilateral basal ganglia and corona radiata. Vascular: No hyperdense vessel or unexpected calcification. CT FACIAL BONES FINDINGS Skull: Normal. Negative for fracture or focal lesion. Facial bones: No displaced fractures or dislocations. Sinuses/Orbits: Mucosal thickening of the maxillary sinuses and ethmoid air cells. Other: Soft tissue contusion of the lips. CT CERVICAL SPINE FINDINGS Alignment: Positional straightening of the normal cervical lordosis. Skull base and vertebrae: No acute fracture. No primary bone lesion or focal pathologic process. Soft tissues and spinal canal: No prevertebral fluid or swelling. No visible canal hematoma. Disc levels: Mild multilevel disc space height loss and osteophytosis. Upper chest: Negative. Other: None. IMPRESSION: 1.  No acute intracranial pathology. 2. Small-vessel white matter disease. Small, nonacute lacunar infarctions bilaterally. 3.  Soft tissue contusion of the lips. 4.  No fracture or static subluxation of the cervical spine. Electronically Signed   By: Eddie Candle M.D.   On: 07/02/2019 18:41    EKG:  Pending.  Assessment/Plan Principal Problem:   Fall Active Problems:   Seizure disorder ([place])   Essential hypertension   CKD (chronic kidney disease) stage 3, GFR 30-59 ml/min  [name] is a [name] [name] speaking 59 y.o. male with medical history significant for TBI with cognitive impairment and seizure disorder, hypertension, CKD stage II-III, and depression who is admitted after a fall at home with possible seizure-like activity in the ED.  Fall at home Seizure disorder: Unclear etiology of fall due to limited history from language barrier.  Possibly mechanical versus related to supratherapeutic phenytoin with subsequent breakthrough seizure.  He had a seizure-like spell while in the ED with what was felt to be a transient postictal phase.  At time of admission he seems to be at his baseline however no interpreter or English-speaking family member available to corroborate.  He has been taking Keppra 1500 mg twice daily and phenytoin ER 200 mg twice daily for seizures. -Phenytoin level supratherapeutic at 24.9 -Decrease home phenytoin to 100 mg 3 times daily per neurology's  recommendation -Repeat phenytoin level in the morning -Continue oral Keppra 1500 mg twice daily -Continue neurochecks with seizure and fall precautions -Reconsult neurology if having recurrent seizure-like episode  Hypertension: BP mildly elevated on admission.  Is not on antihypertensives as an outpatient.  Continue to monitor.  CKD stage II-IIIa: Chronic and appears stable.  Continue to monitor.  Depression: Prescribed fluoxetine as an outpatient, unclear if he has been taking consistently.  Will hold fluoxetine for now.  DVT prophylaxis: Lovenox Code Status: Full code Family Communication: Unavailable Disposition Plan: From home and likely discharge to home in 1 day if not having further seizure like activity Consults called: EDP discussed with neurology Admission status:  Status is:  Observation  The patient remains OBS appropriate and will d/c before 2 midnights.  Dispo: The patient is from: Home              Anticipated d/c is to: Home              Anticipated d/c date is: 1 day              Patient currently is not medically stable to d/c.   Zada Finders MD [facility]  If 7PM-7AM, please contact night-coverage [email or web address]  07/02/2019, 11:08 PM

## 2019-07-02 NOTE — ED Triage Notes (Signed)
Per EMS, Pt is coming from home, pt called out today related to a fall. Pt was walking down some steps and slipped down apprx 3 of them. Complaining of left sided head/leg pain. Small abrasion on lip noted. No LOC, no blood thinners, no deformity noted.

## 2019-07-03 ENCOUNTER — Encounter (HOSPITAL_COMMUNITY): Payer: Self-pay | Admitting: Internal Medicine

## 2019-07-03 DIAGNOSIS — W19XXXA Unspecified fall, initial encounter: Secondary | ICD-10-CM | POA: Diagnosis not present

## 2019-07-03 DIAGNOSIS — G40909 Epilepsy, unspecified, not intractable, without status epilepticus: Secondary | ICD-10-CM | POA: Diagnosis not present

## 2019-07-03 DIAGNOSIS — I10 Essential (primary) hypertension: Secondary | ICD-10-CM | POA: Diagnosis not present

## 2019-07-03 LAB — BASIC METABOLIC PANEL
Anion gap: 8 (ref 5–15)
BUN: 17 mg/dL (ref 6–20)
CO2: 27 mmol/L (ref 22–32)
Calcium: 8.7 mg/dL — ABNORMAL LOW (ref 8.9–10.3)
Chloride: 102 mmol/L (ref 98–111)
Creatinine, Ser: 1.3 mg/dL — ABNORMAL HIGH (ref 0.61–1.24)
GFR calc Af Amer: >60 mL/min (ref >60)
GFR calc non Af Amer: 59 mL/min — ABNORMAL LOW (ref >60)
Glucose, Bld: 106 mg/dL — ABNORMAL HIGH (ref 70–99)
Potassium: 4.4 mmol/L (ref 3.5–5.1)
Sodium: 137 mmol/L (ref 135–145)

## 2019-07-03 LAB — CBC
HCT: 40.1 % (ref 39.0–52.0)
Hemoglobin: 13 g/dL (ref 13.0–17.0)
MCH: 30.7 pg (ref 26.0–34.0)
MCHC: 32.4 g/dL (ref 30.0–36.0)
MCV: 94.6 fL (ref 80.0–100.0)
Platelets: 290 10*3/uL (ref 150–400)
RBC: 4.24 MIL/uL (ref 4.22–5.81)
RDW: 13.2 % (ref 11.5–15.5)
WBC: 11.5 10*3/uL — ABNORMAL HIGH (ref 4.0–10.5)
nRBC: 0 % (ref 0.0–0.2)

## 2019-07-03 LAB — RESPIRATORY PANEL BY RT PCR (FLU A&B, COVID)
Influenza A by PCR: NEGATIVE
Influenza B by PCR: NEGATIVE
SARS Coronavirus 2 by RT PCR: NEGATIVE

## 2019-07-03 LAB — PHENYTOIN LEVEL, TOTAL: Phenytoin Lvl: 20.6 ug/mL — ABNORMAL HIGH (ref 10.0–20.0)

## 2019-07-03 MED ORDER — PHENYTOIN 50 MG PO CHEW: 100.0000 mg | CHEWABLE_TABLET | Freq: Three times a day (TID) | ORAL | 3 refills | Status: DC

## 2019-07-03 NOTE — TOC Progression Note (Signed)
Transition of Care [facility]) - Progression Note    Patient Details  Name: Jevon Weddell MRN: [id number] Date of Birth: 1960/12/02  Transition of Care [facility]) CM/SW Contact  Roman Sandall, Juliann Pulse, RN Phone Number: 07/03/2019, 11:47 AM  Clinical Narrative:  [facility] agency able to accept for [facility] services.     Expected Discharge Plan: [place] Barriers to Discharge: Continued Medical Work up  Expected Discharge Plan and Services Expected Discharge Plan: [place]   Discharge Planning Services: CM Consult   Living arrangements for the past 2 months: Single Family Home                                       Social Determinants of Health (SDOH) Interventions    Readmission Risk Interventions No flowsheet data found.

## 2019-07-03 NOTE — TOC Initial Note (Signed)
Transition of Care [facility]) - Initial/Assessment Note    Patient Details  Name: [name] MRN: [id number] Date of Birth: 03/13/1960  Transition of Care [facility]) CM/SW Contact:    Damon Phi, RN Phone Number: 07/03/2019, 11:28 AM  Clinical Narrative: Patient speaks [name]-brother speaks enough English to answer questions;utilize translator service.PT ordered await recc. Will need HHRN-meds teaching.                  Expected Discharge Plan: [place] Barriers to Discharge: Continued Medical Work up   Patient Goals and CMS Choice Patient states their goals for this hospitalization and ongoing recovery are:: go home      Expected Discharge Plan and Services Expected Discharge Plan: [place]   Discharge Planning Services: CM Consult   Living arrangements for the past 2 months: Single Family Home                                      Prior Living Arrangements/Services Living arrangements for the past 2 months: Single Family Home Lives with:: Siblings Patient language and need for interpreter reviewed:: Yes Do you feel safe going back to the place where you live?: Yes      Need for Family Participation in Patient Care: No (Comment) Care giver support system in place?: Yes (comment)   Criminal Activity/Legal Involvement Pertinent to Current Situation/Hospitalization: No - Comment as needed  Activities of Daily Living      Permission Sought/Granted Permission sought to share information with : Case Manager Permission granted to share information with : Yes, Verbal Permission Granted  Share Information with NAME: Case manager     Permission granted to share info w Relationship: [name]:: Appears stated age Attitude/Demeanor/Rapport: Gracious Affect (typically observed): Accepting Orientation: : Oriented to Self, Oriented to Place, Oriented to  Time(Speaks [name],brother  speaks English-enough to answer questions.) Alcohol / Substance Use: Not Applicable Psych Involvement: No (comment)  Admission diagnosis:  Fall [W19.XXXA] Fall, initial encounter [id number]] Patient Active Problem List   Diagnosis Date Noted  . Fall 07/02/2019  . CKD (chronic kidney disease) stage 3, GFR 30-59 ml/min 07/02/2019  . Cognitive impairment 01/10/2019  . Prolonged seizure ([place]) 09/30/2018  . Noncompliance with medication regimen 11/22/2017  . Altered mental status 03/27/2016  . Sepsis ([place]) 07/03/2015  . CKD (chronic kidney disease), stage II   . Abdominal pain   . Seizures ([place])   . Encounter for therapeutic drug monitoring 01/03/2014  . Partial epilepsy with impairment of consciousness, intractable ([place]) 03/13/2013  . 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 Encounter for long-term (current) use of other medications 03/13/2013  . Weakness generalized 08/05/2012  . Pneumonia 08/04/2012  . Essential hypertension 08/04/2012  . Seizure ([place]) 08/04/2012  . Depression 08/04/2012  . Lower extremity pain 12/21/2011  . Dilantin toxicity 12/19/2011  . Seizure disorder ([place]) 12/19/2011  . Nicole Kindred will get Wo within enhancing 9 101 which it now 500 or iron ad lib. there is no improvement in right 12/18/2011  . Seizure ([place]) 12/17/2011  . Altered mental state 12/17/2011   PCP:  [name], MD Pharmacy:   [place], [place] - [address] [place] [address] Phone: [phone] Fax: [phone]     Social Determinants of Health (  SDOH) Interventions    Readmission Risk Interventions No flowsheet data found.

## 2019-07-03 NOTE — Plan of Care (Signed)

## 2019-07-03 NOTE — Progress Notes (Signed)
Pt discharged home today per Dr. Vallery Ridge. Pt's IV site D/C'd and WDL. Pt's VSS. Pt provided with home medication list, discharge instructions and prescriptions. Verbalized understanding. Pt left floor via WC in stable condition accompanied by NT.

## 2019-07-03 NOTE — Care Management Obs Status (Signed)
[place] NOTIFICATION   Patient Details  Name: [name] MRN: [id number] Date of Birth: [birth year]   Medicare Observation Status Notification Given:  Yes    Dessa Phi, RN 07/03/2019, 11:18 AM

## 2019-07-03 NOTE — Evaluation (Signed)
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 Labs showed elevated dilantin levels.  Clinical Impression  [name] utilized for interpreter services, pt speaks "[place]" dialect. Pt is a vague historian and often did not answer questions that were asked about his home set up and prior functional level. He stated he lives with a male relative who works. Pt ambulated 180' with his straight cane from home, no loss of balance. Pt appears to be at baseline with mobility. He reported his LUE and LLE "haven't worked" since his fall yesterday. With ROM and strength testing, no deficits were noted. Sensation testing was inconclusive 2* pt's inconsistent responses to questions. Noted h/o cognitive impairment & TBI.  Pt denied dizziness during PT session. From PT standpoint, pt is ready to DC home, no f/u PT nor DME needs.     Follow Up Recommendations No PT follow up    Equipment Recommendations  None recommended by PT    Recommendations for Other Services       Precautions / Restrictions Precautions Precautions: Other (comment) Precaution Comments: seizure, fall (admitted with fall, but pt vague historian via interpreter regarding prior falls in past 1 year) Restrictions Weight Bearing Restrictions: No      Mobility  Bed Mobility Overal bed mobility: Independent                Transfers Overall transfer level: Independent                  Ambulation/Gait Ambulation/Gait assistance: Modified independent (Device/Increase time) Gait Distance (Feet): 180 Feet Assistive device: Straight cane Gait Pattern/deviations: Decreased  weight shift to left Gait velocity: WFL   General Gait Details: uses SPC on R side, no loss of balance  Stairs            Wheelchair Mobility    Modified Rankin (Stroke Patients Only)       Balance Overall balance assessment: Modified Independent                                           Pertinent Vitals/Pain Pain Assessment: Faces Faces Pain Scale: No hurt    Home Living Family/patient expects to be discharged to:: Private residence   Available Help at Discharge: Family;Available PRN/intermittently           Home Equipment: Kasandra Knudsen - single point      Prior Function Level of Independence: Independent with assistive device(s)         Comments: walks with SPC (pt had his SPC in room, but when asked by interpreter if he uses AD pt did not answer), pt reached for his cane when asked to walk; lives with a male relative (pt stated he calls him a brother but he's not biologically a brother)  who works during the day     Journalist, newspaper        Extremity/Trunk Assessment   Upper Extremity Assessment Upper Extremity Assessment: Overall WFL for tasks assessed    Lower Extremity Assessment Lower Extremity Assessment: Overall WFL for tasks assessed(via interpreter, pt stated his L  arm and leg haven't worked since fall yesterday; BUE/LE strength and ROM were WNL)    Cervical / Trunk Assessment Cervical / Trunk Assessment: Normal  Communication   Communication: Interpreter utilized;Prefers language other than English([name] dialect; phone interpreter utilized)  Cognition Arousal/Alertness: Awake/alert Behavior During Therapy: WFL for tasks assessed/performed Overall Cognitive Status: No family/caregiver present to determine baseline cognitive functioning                                 General Comments: noted h/o of TBI  and cognitive deficits; interpreter stated pt did not answer some questions appropriately, he was able  to follow 1 step commands, knows he is in the hospital      General Comments      Exercises     Assessment/Plan    PT Assessment Patent does not need any further PT services  PT Problem List         PT Treatment Interventions      PT Goals (Current goals can be found in the Care Plan section)  Acute Rehab PT Goals PT Goal Formulation: All assessment and education complete, DC therapy    Frequency     Barriers to discharge        Co-evaluation               AM-PAC PT "6 Clicks" Mobility  Outcome Measure Help needed turning from your back to your side while in a flat bed without using bedrails?: None Help needed moving from lying on your back to sitting on the side of a flat bed without using bedrails?: None Help needed moving to and from a bed to a chair (including a wheelchair)?: None Help needed standing up from a chair using your arms (e.g., wheelchair or bedside chair)?: None Help needed to walk in hospital room?: None Help needed climbing 3-5 steps with a railing? : None 6 Click Score: 24    End of Session Equipment Utilized During Treatment: Gait belt Activity Tolerance: Patient tolerated treatment well Patient left: in chair;with call bell/phone within reach Nurse Communication: Mobility status      Time: [id number] PT Time Calculation (min) (ACUTE ONLY): 33 min   Charges:   PT Evaluation $PT Eval Low Complexity: 1 Low PT Treatments $Gait Training: 8-22 mins        [name] PT 07/03/2019  Acute Rehabilitation Services Pager [phone] Office [phone]

## 2019-07-03 NOTE — Discharge Summary (Signed)
Physician Discharge Summary  [name] [id number] DOB: 1960/05/17 DOA: 07/02/2019  PCP: [name], MD  Admit date: 07/02/2019 Discharge date: 07/03/2019  Admitted From: Home Disposition:Home with VNS  Recommendations for Outpatient Follow-up:  1. Follow up with PCP in 1-2 weeks 2. Please obtain BMP/CBC in one week 3. Please follow up on the following pending results:  Home Health:Yes Equipment/Devices:None   Discharge Condition:Stable   CODE STATUS:Fall   Diet recommendation: Healthy cardiac low salt  Brief/Interim Summary: [name] is a [place] speaking 59 y.o. male with HTN, BPH, Epilepsy, TBI with cognitive impairment and seizure disorder,  CKD stage II-III who presented to the ED for evaluation after a fall at home.    Pt and brother reported that was climbing steps to home when fell face forward from 3 feet. Hit his head and face. No LOC. No tonic clonic activity by brother.  No CP, palpitation, fever or cough. No new weakness or numbness.    ED Course:  Initial vitals showed BP 121/68, pulse 94, RR 15, temp 99.5 Fahrenheit, SPO2 96% on room air.  Labs are notable for WBC 9.5, hemoglobin 13.5, platelets 309,000, sodium 136, potassium 4.7, bicarb 28, BUN 19, creatinine 1.3, AST 27, ALT 15, alk phos 69, total bilirubin 0.7, magnesium 1.9, serum ethanol level undetectable.  Phenytoin level is supratherapeutic at 24.9.  Levetiracetam level. UDS is negative, Etoh negative.  Urinalysis is negative for UTI.  Left tibia and fibula x-rays negative for evidence of fracture or focal bone lesions.  CT head/maxillofacial/C-spine without contrast were negative for acute intracranial pathology, fracture or static subluxation of the cervical spine.  Small vessel white matter disease and small nonacute lacunar infarcts bilaterally are noted as well as soft tissue contusion of the lips.  While in the ED, patient had an episode where he "started spacing out  and turning towards the left" and would not answer questions being asked by the EDP will continue to stare off.  This reportedly lasted about 2-3 minutes before becoming more responsive.  He was felt to have delay in movement and possibly in postictal state.  Brother reports that he gets these episodes atleast once a month.  Patient was given 1 L normal saline and 1 g IV Keppra in ED.   ED discussed with on-call neurology who recommended reducing home phenytoin dosing from 200 mg twice daily to 100 mg 3 times daily as supratherapeutic dosing may have caused patient to fall and have seizures.  Repeat level in therapeutic range @ 20.    Pt admitted to hospitalist service for monitoring. No further episodes.  He was seen by PT and cleared for discharge. F/U Neurology apt made with [facility]. Spoke to brother through Optometrist.    Discharge Diagnoses:  Principal Problem:   Fall Active Problems:   Seizure disorder [facility])   Essential hypertension   Depression   CKD (chronic kidney disease) stage 3, GFR 30-59 ml/min    Discharge Instructions  Discharge Instructions    Diet - low sodium heart healthy   Complete by: As directed    Increase activity slowly   Complete by: As directed      Allergies as of 07/03/2019   No Known Allergies     Medication List    STOP taking these medications   phenytoin 200 MG ER capsule Commonly known as: DILANTIN     TAKE these medications   DICLOFENAC PO Take 75 mg by mouth as needed.   FLUoxetine  40 MG capsule Commonly known as: PROZAC Take 40 mg by mouth daily.   levETIRAcetam 1000 MG tablet Commonly known as: KEPPRA Take 1,500 mg by mouth 2 (two) times daily. What changed: Another medication with the same name was removed. Continue taking this medication, and follow the directions you see here.   lisinopril 10 MG tablet Commonly known as: ZESTRIL Take 10 mg by mouth daily.   phenytoin 50 MG tablet Commonly known as:  DILANTIN Chew 2 tablets (100 mg total) by mouth 3 (three) times daily.   tamsulosin 0.4 MG Caps capsule Commonly known as: FLOMAX Take 0.4 mg by mouth daily.      Follow-up Information    Care, [facility] Follow up.   Specialty: [place] Why: [facility] nursing-med instruction Contact information: [place] [address] [phone]        Suzzanne Cloud, NP. Go to.   Specialty: Neurology Why: Pt's appointment is for 07/05/19 @ 2:15 pm.   Contact information: [address] [place] [address] [phone]          No Known Allergies  Consultations:  None    Procedures/Studies: DG Tibia/Fibula Left  Result Date: 07/02/2019 CLINICAL DATA:  Pain status post fall EXAM: LEFT TIBIA AND FIBULA - 2 VIEW COMPARISON:  None. FINDINGS: There is no evidence of fracture or other focal bone lesions. Soft tissues are unremarkable. IMPRESSION: Negative. Electronically Signed   By: Constance Holster M.D.   On: 07/02/2019 18:32   CT Head Wo Contrast  Result Date: 07/02/2019 CLINICAL DATA:  Slip and fall, pain EXAM: CT HEAD WITHOUT CONTRAST CT MAXILLOFACIAL WITHOUT CONTRAST CT CERVICAL SPINE WITHOUT CONTRAST TECHNIQUE: Multidetector CT imaging of the head, cervical spine, and maxillofacial structures were performed using the standard protocol without intravenous contrast. Multiplanar CT image reconstructions of the cervical spine and maxillofacial structures were also generated. COMPARISON:  None. FINDINGS: CT HEAD FINDINGS Brain: No evidence of acute infarction, hemorrhage, hydrocephalus, extra-axial collection or mass lesion/mass effect. Periventricular white matter hypodensity. Small lacunar infarctions of the bilateral basal ganglia and corona radiata. Vascular: No hyperdense vessel or unexpected calcification. CT FACIAL BONES FINDINGS Skull: Normal. Negative for fracture or focal lesion. Facial bones: No displaced fractures or dislocations.  Sinuses/Orbits: Mucosal thickening of the maxillary sinuses and ethmoid air cells. Other: Soft tissue contusion of the lips. CT CERVICAL SPINE FINDINGS Alignment: Positional straightening of the normal cervical lordosis. Skull base and vertebrae: No acute fracture. No primary bone lesion or focal pathologic process. Soft tissues and spinal canal: No prevertebral fluid or swelling. No visible canal hematoma. Disc levels: Mild multilevel disc space height loss and osteophytosis. Upper chest: Negative. Other: None. IMPRESSION: 1.  No acute intracranial pathology. 2. Small-vessel white matter disease. Small, nonacute lacunar infarctions bilaterally. 3.  Soft tissue contusion of the lips. 4.  No fracture or static subluxation of the cervical spine. Electronically Signed   By: Eddie Candle M.D.   On: 07/02/2019 18:41   CT Cervical Spine Wo Contrast  Result Date: 07/02/2019 CLINICAL DATA:  Slip and fall, pain EXAM: CT HEAD WITHOUT CONTRAST CT MAXILLOFACIAL WITHOUT CONTRAST CT CERVICAL SPINE WITHOUT CONTRAST TECHNIQUE: Multidetector CT imaging of the head, cervical spine, and maxillofacial structures were performed using the standard protocol without intravenous contrast. Multiplanar CT image reconstructions of the cervical spine and maxillofacial structures were also generated. COMPARISON:  None. FINDINGS: CT HEAD FINDINGS Brain: No evidence of acute infarction, hemorrhage, hydrocephalus, extra-axial collection or  mass lesion/mass effect. Periventricular white matter hypodensity. Small lacunar infarctions of the bilateral basal ganglia and corona radiata. Vascular: No hyperdense vessel or unexpected calcification. CT FACIAL BONES FINDINGS Skull: Normal. Negative for fracture or focal lesion. Facial bones: No displaced fractures or dislocations. Sinuses/Orbits: Mucosal thickening of the maxillary sinuses and ethmoid air cells. Other: Soft tissue contusion of the lips. CT CERVICAL SPINE FINDINGS Alignment: Positional  straightening of the normal cervical lordosis. Skull base and vertebrae: No acute fracture. No primary bone lesion or focal pathologic process. Soft tissues and spinal canal: No prevertebral fluid or swelling. No visible canal hematoma. Disc levels: Mild multilevel disc space height loss and osteophytosis. Upper chest: Negative. Other: None. IMPRESSION: 1.  No acute intracranial pathology. 2. Small-vessel white matter disease. Small, nonacute lacunar infarctions bilaterally. 3.  Soft tissue contusion of the lips. 4.  No fracture or static subluxation of the cervical spine. Electronically Signed   By: Eddie Candle M.D.   On: 07/02/2019 18:41   CT Maxillofacial Wo Contrast  Result Date: 07/02/2019 CLINICAL DATA:  Slip and fall, pain EXAM: CT HEAD WITHOUT CONTRAST CT MAXILLOFACIAL WITHOUT CONTRAST CT CERVICAL SPINE WITHOUT CONTRAST TECHNIQUE: Multidetector CT imaging of the head, cervical spine, and maxillofacial structures were performed using the standard protocol without intravenous contrast. Multiplanar CT image reconstructions of the cervical spine and maxillofacial structures were also generated. COMPARISON:  None. FINDINGS: CT HEAD FINDINGS Brain: No evidence of acute infarction, hemorrhage, hydrocephalus, extra-axial collection or mass lesion/mass effect. Periventricular white matter hypodensity. Small lacunar infarctions of the bilateral basal ganglia and corona radiata. Vascular: No hyperdense vessel or unexpected calcification. CT FACIAL BONES FINDINGS Skull: Normal. Negative for fracture or focal lesion. Facial bones: No displaced fractures or dislocations. Sinuses/Orbits: Mucosal thickening of the maxillary sinuses and ethmoid air cells. Other: Soft tissue contusion of the lips. CT CERVICAL SPINE FINDINGS Alignment: Positional straightening of the normal cervical lordosis. Skull base and vertebrae: No acute fracture. No primary bone lesion or focal pathologic process. Soft tissues and spinal canal: No  prevertebral fluid or swelling. No visible canal hematoma. Disc levels: Mild multilevel disc space height loss and osteophytosis. Upper chest: Negative. Other: None. IMPRESSION: 1.  No acute intracranial pathology. 2. Small-vessel white matter disease. Small, nonacute lacunar infarctions bilaterally. 3.  Soft tissue contusion of the lips. 4.  No fracture or static subluxation of the cervical spine. Electronically Signed   By: Eddie Candle M.D.   On: 07/02/2019 18:41       Subjective: Pt reports left leg pain. Brother at bedside.    Discharge Exam: Vitals:   07/03/19 0607 07/03/19 1423  BP: (!) 144/83 130/84  Pulse: 88 85  Resp: 20 18  Temp: 98.2 F (36.8 C) 98.7 F (37.1 C)  SpO2: 97% 99%   Vitals:   07/03/19 0001 07/03/19 0027 07/03/19 0607 07/03/19 1423  BP: (!) 161/85  (!) 144/83 130/84  Pulse: 86  88 85  Resp: _0 Temp: 98.6 F (37 C)  98.2 F (36.8 C) 98.7 F (37.1 C)  TempSrc:   Oral Oral  SpO2: 99%  97% 99%  Weight:  86.3 kg      General: Pt is alert, awake, not in acute distress Cardiovascular: RRR, S1/S2 +, no rubs, no gallops Respiratory: CTA bilaterally, no wheezing, no rhonchi Abdominal: Soft, NT, ND, bowel sounds + Extremities: no edema, no cyanosis    The results of significant diagnostics from this hospitalization (including imaging, microbiology, ancillary and laboratory) are  listed below for reference.

## 2019-07-04 NOTE — Progress Notes (Signed)
PATIENT: [name] DOB: 03-20-1960  REASON FOR VISIT: follow up HISTORY FROM: patient  HISTORY OF PRESENT ILLNESS: Today 07/05/19  HISTORY: He does not speak [place] and understands very little spoken [place]. He has a caregiver with him today who intreprets.  By history he has mild cerebral palsy and a possible head injury in childhood. He has hx of gout and HTN.  He has a 4th grade education and lives with his caregiver.  His brother says his  seizures have been in good control.  He continues to have problems with short term memory and learning new information.  He has no problems with day to day functioning.  He is independent with ADL's. He had an admission to inpatient [facility] in January and June for uncontrolled anger.   He has been tapered off phenobarbital, he is not taking Dilantin 100 mg 2 tablets twice a day,  levetiracetam 1000mg  1 and a half tablets twice a day. He continues to have seizures, the longest stretch in between is 3 weeks  Update AL 01/10/2019: [name] a 59 y.o.malehere today for follow up for seizure. He was admitted to the hospital in 08/2018 for prolonged seizure. He had not been complaint with therapy. He was discharged and advised to continue levetiracetam 1500mg  twice daily and phenytoin 150mg  twice daily.His brother is primary caregiver. 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 He is seen by PCP regularly. PCP recently increased phenytoin to 200mg  twice daily. His brother reports that he has not had a seizure since increase about 1 month ago. He is assisting [name] with medications. [name] is able to bath and dress him self. He does not drive. Brother reports that sometimes he is oriented to place and time and sometimes he seems confused. He will often say "I  don't know" to questions if he is anxious. Native language [place]. He is present with his interpreter, who also serves as his Theme park manager. Interpreter agrees that patient can seem normal one day and "not himself" the next day. He has been working with PCP for adjustment of depression/anxiety medications. Family is uncertain if this is helping.  UPDATE 04/11/2019 AL: [name] is a 59 y.o. male here today for follow up for seizure. He continues phenytoin ER 200mg  twice daily and levetiracetam 1500 mg twice daily. He does not speak english and HPI is provided by his brother and their interpreter/pastor. His brother reports that he is doing well. No seizures since last being seen. PCP has been refilling medications for him. He stopped lisinopril due to concerns of low BP readings but was recently told by PCP to resume lisinopril.   Update Jul 05, 2019 SS: He was recently admitted to the hospital for fall 07/02/19, Dilantin level was elevated 24.9, while in the ED had episode where he was spacing out, turning towards the left, would not answer questions, lasted 2 to 3 minutes before becoming more responsive, brother reports these episodes once a month.  His dose of Dilantin was decreased from 200 mg twice daily, to 100 mg 3 times daily, elevated Dilantin level may have caused patient to fall and have seizure, repeat Dilantin level was 20. He was loaded with IV Keppra  CT head 07/02/2019 IMPRESSION: 1.  No acute intracranial pathology. 2. Small-vessel white matter disease. Small,  nonacute lacunar infarctions bilaterally. 3.  Soft tissue contusion of the lips. 4.  No fracture or static subluxation of the cervical spine.  Here today with interpreter and his brother. His brother was not actually at the hospital when the "seizure" occurred. When he fell, he felt normal that day, just tripped, and fell, after the fall, he felt dizzy, blurry vision, which is why he went to the hospital.  He is doing well today, at  baseline, tolerating medications, does have some right hand pain from the fall.   REVIEW OF SYSTEMS: Out of a complete 14 system review of symptoms, the patient complains only of the following symptoms, and all other reviewed systems are negative.  Seizure  ALLERGIES: No Known Allergies  HOME MEDICATIONS: Outpatient Medications Prior to Visit  Medication Sig Dispense Refill  . FLUoxetine (PROZAC) 40 MG capsule Take 40 mg by mouth daily.    [name] levETIRAcetam (KEPPRA) 1000 MG tablet Take 1,500 mg by mouth 2 (two) times daily.    [name] lisinopril (ZESTRIL) 10 MG tablet Take 10 mg by mouth daily.    . tamsulosin (FLOMAX) 0.4 MG CAPS capsule Take 0.4 mg by mouth daily.    . phenytoin (DILANTIN) 50 MG tablet Chew 2 tablets (100 mg total) by mouth 3 (three) times daily. 90 tablet 3  . DICLOFENAC PO Take 75 mg by mouth as needed.     No facility-administered medications prior to visit.    PAST MEDICAL HISTORY: Past Medical History:  Diagnosis Date  . Anxiety   . Back pain    low back pain/ compressed disk in lower back.   . CKD (chronic kidney disease), stage II   . Depression   . Hypertension   . Language barrier 03-01-13   Speaks very little English,primary-Vietnamese "[name] Dialect"  . Seizures ([place])    due head trauma-seizures are less, but occurs from mild to more severe, which causes agitation to mental state.  [name] TBI (traumatic brain injury) ([place])    CARE FOR BY BROTHER    PAST SURGICAL HISTORY: Past Surgical History:  Procedure Laterality Date  . ABDOMINAL SURGERY    . COLONOSCOPY N/A 03/17/2013   Procedure: COLONOSCOPY;  Surgeon: [name], MD;  Location: [facility];  Service: Endoscopy;  Laterality: N/A;  . NO PAST SURGERIES     ? one surgery in [place]-? what, has abdominal scar.    FAMILY HISTORY: Family History  Problem Relation Age of Onset  . Seizures Mother        per his nonbiological brother    SOCIAL HISTORY: Social History   Socioeconomic History    . Marital status: Single    Spouse name: Not on file  . Number of children: Not on file  . Years of education: 4  . Highest education level: Not on file  Occupational History    Employer: UNEMPLOYED  Tobacco Use  . Smoking status: Former Smoker    Quit date: 03/02/1989    Years since quitting: 30.3  . Smokeless tobacco: Never Used  Substance and Sexual Activity  . Alcohol use: No  . Drug use: No  . Sexual activity: Not Currently  Other Topics Concern  . Not on file  Social History Narrative   ** Merged History Encounter **    Patient is single.   Patient is disabled.   Patient has a 4th grade education.   Patient drinks two cups of coffee daily.   Social Determinants of Health   Financial  Resource Strain:   . Difficulty of Paying Living Expenses:   Food Insecurity:   . Worried About Charity fundraiser in the Last Year:   . Arboriculturist in the Last Year:   Transportation Needs:   . Film/video editor (Medical):   [name] Lack of Transportation (Non-Medical):   Physical Activity:   . Days of Exercise per Week:   . Minutes of Exercise per Session:   Stress:   . Feeling of Stress :   Social Connections:   . Frequency of Communication with Friends and Family:   . Frequency of Social Gatherings with Friends and Family:   . Attends Religious Services:   . Active Member of Clubs or Organizations:   . Attends Archivist Meetings:   [name] Marital Status:   Intimate Partner Violence:   . Fear of Current or Ex-Partner:   . Emotionally Abused:   [name] Physically Abused:   . Sexually Abused:    PHYSICAL EXAM  Vitals:   07/05/19 1410  BP: 118/68  Pulse: 90  Temp: 98.7 F (37.1 C)  SpO2: 96%  Weight: 184 lb 12.8 oz (83.8 kg)  Height: 5\' 5"  (1.651 m)   Body mass index is 30.75 kg/m.  Generalized: Well developed, in no acute distress   Neurological examination  Mentation: Alert, translator was used, history is provided by his brother, follows exam commands  well via translation Cranial nerve II-XII: Pupils were equal round reactive to light. Extraocular movements were full, visual field were full on confrontational test. Facial sensation and strength were normal. Head turning and shoulder shrug  were normal and symmetric. Motor: The motor testing reveals 5 over 5 strength of all 4 extremities. Good symmetric motor tone is noted throughout.  Sensory: Sensory testing is intact to soft touch on all 4 extremities. No evidence of extinction is noted.  Coordination: Cerebellar testing reveals good finger-nose-finger and heel-to-shin bilaterally.  Gait and station: Gait is normal, no assistive device Reflexes: Deep tendon reflexes are symmetric and normal bilaterally.   DIAGNOSTIC DATA (LABS, IMAGING, TESTING) - I reviewed patient records, labs, notes, testing and imaging myself where available.  Lab Results  Component Value Date   WBC 11.5 (H) 07/03/2019   HGB 13.0 07/03/2019   HCT 40.1 07/03/2019   MCV 94.6 07/03/2019   PLT 290 07/03/2019      Component Value Date/Time   NA 137 07/03/2019 0422   NA 142 04/11/2019 1458   K 4.4 07/03/2019 0422   CL 102 07/03/2019 0422   CO2 27 07/03/2019 0422   GLUCOSE 106 (H) 07/03/2019 0422   BUN 17 07/03/2019 0422   BUN 12 04/11/2019 1458   CREATININE 1.30 (H) 07/03/2019 0422   CALCIUM 8.7 (L) 07/03/2019 0422   PROT 7.7 07/02/2019 1802   PROT 7.7 04/11/2019 1458   ALBUMIN 4.0 07/02/2019 1802   ALBUMIN 4.2 04/11/2019 1458   AST 27 07/02/2019 1802   ALT 15 07/02/2019 1802   ALKPHOS 69 07/02/2019 1802   BILITOT 0.7 07/02/2019 1802   BILITOT <0.2 04/11/2019 1458   GFRNONAA 59 (L) 07/03/2019 0422   GFRAA >60 07/03/2019 0422   No results found for: CHOL, HDL, LDLCALC, LDLDIRECT, TRIG, CHOLHDL Lab Results  Component Value Date   HGBA1C 5.8 (H) 03/27/2016   Lab Results  Component Value Date   [id number] 03/27/2016   Lab Results  Component Value Date   TSH 1.107 10/01/2018       ASSESSMENT AND  PLAN 59 y.o. year old male  has a past medical history of Anxiety, Back pain, CKD (chronic kidney disease), stage II, Depression, Hypertension, Language barrier (03-01-13), Seizures ([place]), and TBI (traumatic brain injury) ([place]). here with:  1.  Seizures, dilantin toxicity, recent fall 2.  Traumatic brain injury -Was recently discharged from the hospital on 07/03/2019, following a fall, possibly related to elevated Dilantin level (24.9), witnessed seizure-like activity in the ER (but this is baseline, once a month per brother) -Stop Dilantin 50 mg, 2 tablets twice daily, switch to Dilantin ER 100 mg, 3 capsules at bedtime  -Continue Keppra 1500 mg twice daily -On hospital discharge, Dilantin level was 20.6, was 24.9 on Dilantin 200 mg twice daily -Will recheck Dilantin level today, will have patient come back in 4 weeks for another baseline Dilantin level with adjustment and switch to single dose Dilantin ER -Call for seizure, keep appointment in August  -See PCP for evaluation of right hand pain following fall   I spent 30 minutes of face-to-face and non-face-to-face time with patient.  This included previsit chart review, lab review, study review, order entry, electronic health record documentation, patient education.   Butler Denmark, AGNP-C, DNP 07/05/2019, 4:33 PM [facility] [address], [place],  [address] [phone]

## 2019-07-05 ENCOUNTER — Encounter: Payer: Self-pay | Admitting: Neurology

## 2019-07-05 ENCOUNTER — Other Ambulatory Visit: Payer: Self-pay

## 2019-07-05 ENCOUNTER — Ambulatory Visit (INDEPENDENT_AMBULATORY_CARE_PROVIDER_SITE_OTHER): Payer: Medicare Other | Admitting: Neurology

## 2019-07-05 VITALS — BP 118/68 | HR 90 | Temp 98.7°F | Ht 65.0 in | Wt 184.8 lb

## 2019-07-05 DIAGNOSIS — T420X1S Poisoning by hydantoin derivatives, accidental (unintentional), sequela: Secondary | ICD-10-CM

## 2019-07-05 DIAGNOSIS — G40909 Epilepsy, unspecified, not intractable, without status epilepticus: Secondary | ICD-10-CM

## 2019-07-05 DIAGNOSIS — S069X9A Unspecified intracranial injury with loss of consciousness of unspecified duration, initial encounter: Secondary | ICD-10-CM | POA: Insufficient documentation

## 2019-07-05 DIAGNOSIS — S069XAA Unspecified intracranial injury with loss of consciousness status unknown, initial encounter: Secondary | ICD-10-CM | POA: Insufficient documentation

## 2019-07-05 MED ORDER — PHENYTOIN SODIUM EXTENDED 100 MG PO CAPS
300.0000 mg | ORAL_CAPSULE | Freq: Every day | ORAL | 11 refills | Status: DC
Start: 2019-07-05 — End: 2021-01-09

## 2019-07-05 NOTE — Patient Instructions (Signed)
Switch to Dilantin ER 100 mg, 3 capsules at bedtime Continue the Keppra  Check Dilantin level today  Let's recheck Dilantin in 4 weeks

## 2019-07-06 LAB — PHENYTOIN LEVEL, TOTAL: Phenytoin (Dilantin), Serum: 18.9 ug/mL (ref 10.0–20.0)

## 2019-07-10 ENCOUNTER — Telehealth: Payer: Self-pay | Admitting: Neurology

## 2019-07-10 NOTE — Telephone Encounter (Signed)
Called the patient through interpretor services and they gave the wrong interpreting language.i called back and they didn't have a [place]agn[place] language available.  Attempted another call to the patient and his brother answer, who was able to understand some english. Advised the labs [name] had were normal and looked good. He states he understood.

## 2019-07-10 NOTE — Telephone Encounter (Signed)
-----   Message from Darleen Crocker, RN sent at 07/06/2019  9:54 AM EDT -----  ----- Message ----- From: Suzzanne Cloud, NP Sent: 07/06/2019   7:45 AM EDT To: Brandon Melnick, RN  Dilantin level looks good.

## 2019-07-11 NOTE — Progress Notes (Signed)
I have reviewed and agreed above plan. 

## 2019-08-08 ENCOUNTER — Telehealth: Payer: Self-pay | Admitting: Neurology

## 2019-08-08 DIAGNOSIS — G40909 Epilepsy, unspecified, not intractable, without status epilepticus: Secondary | ICD-10-CM

## 2019-08-08 NOTE — Telephone Encounter (Signed)
Please call and check on patient. Have him come back for Dilantin level. Just blood draw. I placed the order.

## 2019-08-09 NOTE — Telephone Encounter (Signed)
Spoke to pt's brother, they will try to get labs done on 08/10/19

## 2019-08-10 ENCOUNTER — Other Ambulatory Visit (INDEPENDENT_AMBULATORY_CARE_PROVIDER_SITE_OTHER): Payer: Self-pay

## 2019-08-10 DIAGNOSIS — Z0289 Encounter for other administrative examinations: Secondary | ICD-10-CM

## 2019-08-10 DIAGNOSIS — G40909 Epilepsy, unspecified, not intractable, without status epilepticus: Secondary | ICD-10-CM | POA: Diagnosis not present

## 2019-08-11 LAB — PHENYTOIN LEVEL, TOTAL: Phenytoin (Dilantin), Serum: 6.5 ug/mL — ABNORMAL LOW (ref 10.0–20.0)

## 2019-08-14 ENCOUNTER — Telehealth: Payer: Self-pay

## 2019-08-14 NOTE — Telephone Encounter (Addendum)
LM with a family member for the patient to call back. Re: results  ----- Message from Suzzanne Cloud, NP sent at 08/14/2019  7:48 AM EDT ----- Dilantin level is mildly low, make sure he is taking his Dilantin as prescribed. He has previously had HIGH levels. If he is doing well, no seizures, we can stay at current dosing. We adjusted to Dilantin ER 100 mg, 3 at bedtime.

## 2019-08-17 NOTE — Telephone Encounter (Signed)
-----   Message from Suzzanne Cloud, NP sent at 08/14/2019  7:48 AM EDT ----- Dilantin level is mildly low, make sure he is taking his Dilantin as prescribed. He has previously had HIGH levels. If he is doing well, no seizures, we can stay at current dosing. We adjusted to Dilantin ER 100 mg, 3 at bedtime.

## 2019-08-17 NOTE — Telephone Encounter (Signed)
[place] interpreter Id#  [phone] contacted to relay a message to the patient.  Pt was contacted and notified of the message below.

## 2019-09-22 DIAGNOSIS — G40909 Epilepsy, unspecified, not intractable, without status epilepticus: Secondary | ICD-10-CM | POA: Diagnosis not present

## 2019-09-22 DIAGNOSIS — I10 Essential (primary) hypertension: Secondary | ICD-10-CM | POA: Diagnosis not present

## 2019-09-22 DIAGNOSIS — Z131 Encounter for screening for diabetes mellitus: Secondary | ICD-10-CM | POA: Diagnosis not present

## 2019-10-06 DIAGNOSIS — F329 Major depressive disorder, single episode, unspecified: Secondary | ICD-10-CM | POA: Diagnosis not present

## 2019-10-06 DIAGNOSIS — F439 Reaction to severe stress, unspecified: Secondary | ICD-10-CM | POA: Diagnosis not present

## 2019-10-09 ENCOUNTER — Ambulatory Visit: Payer: Medicare Other | Admitting: Family Medicine

## 2019-11-14 ENCOUNTER — Telehealth: Payer: Self-pay

## 2019-11-14 NOTE — Telephone Encounter (Signed)
Phone call to patient's brother YPhi Andreoli to tell him that patient has a dental appointment on Wednesday 11/22/19 at 8:00 am with Dr. Quincy Simmonds ([place]  He says Zev has a hole in a lower left back tooth and it hurts when he eats. Dentist will call if they have a cancellation so he will not have to wait a week.  Jake Michaelis RN, Congregational Nurse [phone]

## 2019-12-22 DIAGNOSIS — Z131 Encounter for screening for diabetes mellitus: Secondary | ICD-10-CM | POA: Diagnosis not present

## 2019-12-22 DIAGNOSIS — Z0000 Encounter for general adult medical examination without abnormal findings: Secondary | ICD-10-CM | POA: Diagnosis not present

## 2019-12-22 DIAGNOSIS — I10 Essential (primary) hypertension: Secondary | ICD-10-CM | POA: Diagnosis not present

## 2019-12-22 DIAGNOSIS — Z13220 Encounter for screening for lipoid disorders: Secondary | ICD-10-CM | POA: Diagnosis not present

## 2019-12-22 DIAGNOSIS — Z125 Encounter for screening for malignant neoplasm of prostate: Secondary | ICD-10-CM | POA: Diagnosis not present

## 2019-12-22 DIAGNOSIS — G40909 Epilepsy, unspecified, not intractable, without status epilepticus: Secondary | ICD-10-CM | POA: Diagnosis not present

## 2019-12-22 DIAGNOSIS — Z23 Encounter for immunization: Secondary | ICD-10-CM | POA: Diagnosis not present

## 2019-12-22 DIAGNOSIS — E559 Vitamin D deficiency, unspecified: Secondary | ICD-10-CM | POA: Diagnosis not present

## 2019-12-25 DIAGNOSIS — F329 Major depressive disorder, single episode, unspecified: Secondary | ICD-10-CM | POA: Diagnosis not present

## 2019-12-25 DIAGNOSIS — F439 Reaction to severe stress, unspecified: Secondary | ICD-10-CM | POA: Diagnosis not present

## 2019-12-27 ENCOUNTER — Ambulatory Visit (INDEPENDENT_AMBULATORY_CARE_PROVIDER_SITE_OTHER): Payer: Medicare Other | Admitting: Family Medicine

## 2019-12-27 ENCOUNTER — Encounter: Payer: Self-pay | Admitting: Family Medicine

## 2019-12-27 VITALS — BP 128/72 | HR 71 | Ht 67.0 in | Wt 167.0 lb

## 2019-12-27 DIAGNOSIS — F32A Depression, unspecified: Secondary | ICD-10-CM

## 2019-12-27 DIAGNOSIS — R4189 Other symptoms and signs involving cognitive functions and awareness: Secondary | ICD-10-CM

## 2019-12-27 DIAGNOSIS — F419 Anxiety disorder, unspecified: Secondary | ICD-10-CM

## 2019-12-27 DIAGNOSIS — G40909 Epilepsy, unspecified, not intractable, without status epilepticus: Secondary | ICD-10-CM | POA: Diagnosis not present

## 2019-12-27 NOTE — Patient Instructions (Signed)
Below is our plan:  We will continue levetiracetam 1500mg  (1.5 tablets)  twice daily and phenytoin 300mg  ( 3 tablets) every night at bedtime. We will contact Dr Jeanie Cooks to request lab results. If Dilantin level not drawn, I will have you return to have drawn here with [place].   Please make sure you are staying well hydrated. I recommend 50-60 ounces daily. Well balanced diet and regular exercise encouraged.    Please continue follow up with care team as directed.    Follow up in 6 months   You may receive a survey regarding today's visit. I encourage you to leave honest feed back as I do use this information to improve patient care. Thank you for seeing me today!      According to [place] law, you can not drive unless you are seizure / syncope free for at least 6 months and under physician's care.  Please maintain precautions. Do not participate in activities where a loss of awareness could harm you or someone else. No swimming alone, no tub bathing, no hot tubs, no driving, no operating motorized vehicles (cars, ATVs, motocycles, etc), lawnmowers, power tools or firearms. No standing at heights, such as rooftops, ladders or stairs. Avoid hot objects such as stoves, heaters, open fires. Wear a helmet when riding a bicycle, scooter, skateboard, etc. and avoid areas of traffic. Set your water heater to 120 degrees or less.    Seizure, Adult A seizure is a sudden burst of abnormal electrical activity in the brain. Seizures usually last from 30 seconds to 2 minutes. They can cause many different symptoms. Usually, seizures are not harmful unless they last a long time. What are the causes? Common causes of this condition include:  Fever or infection.  Conditions that affect the brain, such as: ? A brain abnormality that you were born with. ? A brain or head injury. ? Bleeding in the brain. ? A tumor. ? Stroke. ? Brain disorders such as autism or cerebral palsy.  Low blood  sugar.  Conditions that are passed from parent to child (are inherited).  Problems with substances, such as: ? Having a reaction to a drug or a medicine. ? Suddenly stopping the use of a substance (withdrawal). In some cases, the cause may not be known. A person who has repeated seizures over time without a clear cause has a condition called epilepsy. What increases the risk? You are more likely to get this condition if you have:  A family history of epilepsy.  Had a seizure in the past.  A brain disorder.  A history of head injury, lack of oxygen at birth, or strokes. What are the signs or symptoms? There are many types of seizures. The symptoms vary depending on the type of seizure you have. Examples of symptoms during a seizure include:  Shaking (convulsions).  Stiffness in the body.  Passing out (losing consciousness).  Head nodding.  Staring.  Not responding to sound or touch.  Loss of bladder control and bowel control. Some people have symptoms right before and right after a seizure happens. Symptoms before a seizure may include:  Fear.  Worry (anxiety).  Feeling like you may vomit (nauseous).  Feeling like the room is spinning (vertigo).  Feeling like you saw or heard something before (dj vu).  Odd tastes or smells.  Changes in how you see. You may see flashing lights or spots. Symptoms after a seizure happens can include:  Confusion.  Sleepiness.  Headache.  Weakness on  one side of the body. How is this treated? Most seizures will stop on their own in under 5 minutes. In these cases, no treatment is needed. Seizures that last longer than 5 minutes will usually need treatment. Treatment can include:  Medicines given through an IV tube.  Avoiding things that are known to cause your seizures. These can include medicines that you take for another condition.  Medicines to treat epilepsy.  Surgery to stop the seizures. This may be needed if  medicines do not help. Follow these instructions at home: Medicines  Take over-the-counter and prescription medicines only as told by your doctor.  Do not eat or drink anything that may keep your medicine from working, such as alcohol. Activity  Do not do any activities that would be dangerous if you had another seizure, like driving or swimming. Wait until your doctor says it is safe for you to do them.  If you live in the [place] ask your local DMV (department of motor vehicles) when you can drive.  Get plenty of rest. Teaching others Teach friends and family what to do when you have a seizure. They should:  Lay you on the ground.  Protect your head and body.  Loosen any tight clothing around your neck.  Turn you on your side.  Not hold you down.  Not put anything into your mouth.  Know whether or not you need emergency care.  Stay with you until you are better.  General instructions  Contact your doctor each time you have a seizure.  Avoid anything that gives you seizures.  Keep a seizure diary. Write down: ? What you think caused each seizure. ? What you remember about each seizure.  Keep all follow-up visits as told by your doctor. This is important. Contact a doctor if:  You have another seizure.  You have seizures more often.  There is any change in what happens during your seizures.  You keep having seizures with treatment.  You have symptoms of being sick or having an infection. Get help right away if:  You have a seizure that: ? Lasts longer than 5 minutes. ? Is different than seizures you had before. ? Makes it harder to breathe. ? Happens after you hurt your head.  You have any of these symptoms after a seizure: ? Not being able to speak. ? Not being able to use a part of your body. ? Confusion. ? A bad headache.  You have two or more seizures in a row.  You do not wake up right after a seizure.  You get hurt during a seizure. These  symptoms may be an emergency. Do not wait to see if the symptoms will go away. Get medical help right away. Call your local emergency services (911 in the [place] Do not drive yourself to the hospital. Summary  Seizures usually last from 30 seconds to 2 minutes. Usually, they are not harmful unless they last a long time.  Do not eat or drink anything that may keep your medicine from working, such as alcohol.  Teach friends and family what to do when you have a seizure.  Contact your doctor each time you have a seizure. This information is not intended to replace advice given to you by your health care provider. Make sure you discuss any questions you have with your health care provider. Document Revised: 05/06/2018 Document Reviewed: 05/06/2018 Elsevier Patient Education  [place].

## 2019-12-27 NOTE — Progress Notes (Signed)
Chief Complaint  Patient presents with  . Follow-up    rm 2  . Seizures     HISTORY OF PRESENT ILLNESS: Today 12/27/19  [name] is a 59 y.o. male here today for follow up for seizures. He has continues levetiracetam 1500mg  BID and phenytoin 300mg  at bedtime. Last level 6.5 in 08/2019. Psychiatry continues fluoxetine for mood management. He seems to be tolerating well. He was seen by PCP Monday. Labs were taken and family thinks AED levels were checked. PCP witting levetiracetam. No obvious seizure activity since last being seen. He does continue to have episodes where he clenches his fist and moans. No tonic clonic movements, falls, unclear if he is aware if event.    HISTORY (copied from my note on 07/05/2019)  HISTORY: He does not speak [place] and understands very little spoken [place]. He has a caregiver with him today who intreprets. By history he has mild cerebral palsy and a possible head injury in childhood. He has hx of gout and HTN. He has a 4th grade education and lives with his caregiver.  His brother says his seizures have been in good control. He continues to have problems with short term memory and learning new information. He has no problems with day to day functioning. He is independent with ADL's. He had an admission to inpatient Behavorial health in January and June for uncontrolled anger.   He has been tapered off phenobarbital, he is not taking Dilantin 100 mg 2 tablets twice a day, levetiracetam 1000mg  1 and a half tablets twice a day. He continues to have seizures, the longest stretch in between is 3 weeks  Update AL 01/10/2019: [name] a 59 y.o.malehere today for follow up for seizure. He was admitted to the hospital in 08/2018 for prolonged seizure. He had not been complaint with therapy. He was discharged and advised to continue levetiracetam 1500mg  twice daily and phenytoin 150mg  twice daily.His brother is primary caregiver. Patient has history  of severe anxiety and depression as well as cognitive impairment (possible dx of cerebral palsy and TBI in childhood.) His brother does not feel he has been diagnosed with dementia.He has 4th grade education. He is seen by PCP regularly. PCP recently increased phenytoin to 200mg  twice daily. His brother reports that he has not had a seizure since increase about 1 month ago. He is assisting [name] with medications. [name] is able to bath and dress him self. He does not drive. Brother reports that sometimes he is oriented to place and time and sometimes he seems confused. He will often say "I don't know" to questions if he is anxious. Native language [place]. He is present with his interpreter, who also serves as his Theme park manager. Interpreter agrees that patient can seem normal one day and "not himself" the next day. He has been working with PCP for adjustment of depression/anxiety medications. Family is uncertain if this is helping.  UPDATE 04/11/2019 AL: [name] a 59 y.o.malehere today for follow up for seizure. He continues phenytoin ER 200mg  twice daily and levetiracetam 1500 mg twice daily.He does not speak english and HPI is provided by his brother and their interpreter/pastor. His brother reports that he is doing well. No seizures since last being seen. PCP has been refilling medications for him. He stopped lisinopril due to concerns of low BP readings but was recently told by PCP to resume lisinopril.  Update Jul 05, 2019 SS: He was recently admitted to the hospital for fall  07/02/19, Dilantin level was elevated 24.9, while in the ED had episode where he was spacing out, turning towards the left, would not answer questions, lasted 2 to 3 minutes before becoming more responsive, brother reports these episodes once a month.  His dose of Dilantin was decreased from 200 mg twice daily, to 100 mg 3 times daily, elevated Dilantin level may have caused patient to fall and have seizure, repeat Dilantin level  was 20. He was loaded with IV Keppra  CT head 07/02/2019 IMPRESSION: 1. No acute intracranial pathology. 2. Small-vessel white matter disease. Small, nonacute lacunar infarctions bilaterally. 3. Soft tissue contusion of the lips. 4. No fracture or static subluxation of the cervical spine.  Here today with interpreter and his brother. His brother was not actually at the hospital when the "seizure" occurred. When he fell, he felt normal that day, just tripped, and fell, after the fall, he felt dizzy, blurry vision, which is why he went to the hospital.  He is doing well today, at baseline, tolerating medications, does have some right hand pain from the fall.      REVIEW OF SYSTEMS: Out of a complete 14 system review of symptoms, the patient complains only of the following symptoms, seizures, anxiety, depression, anger outburst, and all other reviewed systems are negative.   ALLERGIES: No Known Allergies   HOME MEDICATIONS: Outpatient Medications Prior to Visit  Medication Sig Dispense Refill  . FLUoxetine (PROZAC) 40 MG capsule Take 40 mg by mouth daily.    [name] levETIRAcetam (KEPPRA) 1000 MG tablet Take 1,500 mg by mouth 2 (two) times daily.    [name] lisinopril (ZESTRIL) 10 MG tablet Take 10 mg by mouth daily.    . phenytoin (DILANTIN) 100 MG ER capsule Take 3 capsules (300 mg total) by mouth at bedtime. 90 capsule 11  . tamsulosin (FLOMAX) 0.4 MG CAPS capsule Take 0.4 mg by mouth daily.     No facility-administered medications prior to visit.     PAST MEDICAL HISTORY: Past Medical History:  Diagnosis Date  . Anxiety   . Back pain    low back pain/ compressed disk in lower back.   . CKD (chronic kidney disease), stage II   . Depression   . Hypertension   . Language barrier 03-01-13   Speaks very little English,primary-Vietnamese "[name] Dialect"  . Seizures ([place])    due head trauma-seizures are less, but occurs from mild to more severe, which causes agitation to mental state.   [name] TBI (traumatic brain injury) ([place])    CARE FOR BY BROTHER     PAST SURGICAL HISTORY: Past Surgical History:  Procedure Laterality Date  . ABDOMINAL SURGERY    . COLONOSCOPY N/A 03/17/2013   Procedure: COLONOSCOPY;  Surgeon: [name], MD;  Location: [facility];  Service: Endoscopy;  Laterality: N/A;  . NO PAST SURGERIES     ? one surgery in [place]-? what, has abdominal scar.     FAMILY HISTORY: Family History  Problem Relation Age of Onset  . Seizures Mother        per his nonbiological brother     SOCIAL HISTORY: Social History   Socioeconomic History  . Marital status: Single    Spouse name: Not on file  . Number of children: Not on file  . Years of education: 4  . Highest education level: Not on file  Occupational History    Employer: UNEMPLOYED  Tobacco Use  . Smoking status: Former Audiological scientist  date: 03/02/1989    Years since quitting: 30.8  . Smokeless tobacco: Never Used  Substance and Sexual Activity  . Alcohol use: No  . Drug use: No  . Sexual activity: Not Currently  Other Topics Concern  . Not on file  Social History Narrative   ** Merged History Encounter **    Patient is single.   Patient is disabled.   Patient has a 4th grade education.   Patient drinks two cups of coffee daily.   Social Determinants of Health   Financial Resource Strain:   . Difficulty of Paying Living Expenses: Not on file  Food Insecurity:   . Worried About Charity fundraiser in the Last Year: Not on file  . Ran Out of Food in the Last Year: Not on file  Transportation Needs:   . Lack of Transportation (Medical): Not on file  . Lack of Transportation (Non-Medical): Not on file  Physical Activity:   . Days of Exercise per Week: Not on file  . Minutes of Exercise per Session: Not on file  Stress:   . Feeling of Stress : Not on file  Social Connections:   . Frequency of Communication with Friends and Family: Not on file  . Frequency of Social Gatherings  with Friends and Family: Not on file  . Attends Religious Services: Not on file  . Active Member of Clubs or Organizations: Not on file  . Attends Archivist Meetings: Not on file  . Marital Status: Not on file  Intimate Partner Violence:   . Fear of Current or Ex-Partner: Not on file  . Emotionally Abused: Not on file  . Physically Abused: Not on file  . Sexually Abused: Not on file      PHYSICAL EXAM  Vitals:   12/27/19 1034  BP: 128/72  Pulse: 71  Weight: 167 lb (75.8 kg)  Height: 5\' 7"  (1.702 m)   Body mass index is 26.16 kg/m.   Generalized: Well developed, in no acute distress   Neurological examination  Mentation: Alert, not oriented to time, place, history taking. Follows all commands speech and language fluent Cranial nerve II-XII: Pupils were equal round reactive to light. Extraocular movements were full, visual field were full on confrontational test. Facial sensation and strength were normal. Head turning and shoulder shrug  were normal and symmetric. Motor: The motor testing reveals 5 over 5 strength of all 4 extremities. Good symmetric motor tone is noted throughout.  Sensory: Sensory testing is intact to soft touch on all 4 extremities. No evidence of extinction is noted.  Gait and station: Gait is wide, stable with cane   Reflexes: Deep tendon reflexes are symmetric and normal bilaterally.     DIAGNOSTIC DATA (LABS, IMAGING, TESTING) - I reviewed patient records, labs, notes, testing and imaging myself where available.  Lab Results  Component Value Date   WBC 11.5 (H) 07/03/2019   HGB 13.0 07/03/2019   HCT 40.1 07/03/2019   MCV 94.6 07/03/2019   PLT 290 07/03/2019      Component Value Date/Time   NA 137 07/03/2019 0422   NA 142 04/11/2019 1458   K 4.4 07/03/2019 0422   CL 102 07/03/2019 0422   CO2 27 07/03/2019 0422   GLUCOSE 106 (H) 07/03/2019 0422   BUN 17 07/03/2019 0422   BUN 12 04/11/2019 1458   CREATININE 1.30 (H) 07/03/2019  0422   CALCIUM 8.7 (L) 07/03/2019 0422   PROT 7.7 07/02/2019 1802   PROT  7.7 04/11/2019 1458   ALBUMIN 4.0 07/02/2019 1802   ALBUMIN 4.2 04/11/2019 1458   AST 27 07/02/2019 1802   ALT 15 07/02/2019 1802   ALKPHOS 69 07/02/2019 1802   BILITOT 0.7 07/02/2019 1802   BILITOT <0.2 04/11/2019 1458   GFRNONAA 59 (L) 07/03/2019 0422   GFRAA >60 07/03/2019 0422   No results found for: CHOL, HDL, LDLCALC, LDLDIRECT, TRIG, CHOLHDL Lab Results  Component Value Date   HGBA1C 5.8 (H) 03/27/2016   Lab Results  Component Value Date   [id number] 897 03/27/2016   Lab Results  Component Value Date   TSH 1.107 10/01/2018      ASSESSMENT AND PLAN  59 y.o. year old male  has a past medical history of Anxiety, Back pain, CKD (chronic kidney disease), stage II, Depression, Hypertension, Language barrier (03-01-13), Seizures ([place]), and TBI (traumatic brain injury) ([place]). here with   Seizure disorder ([place])  Cognitive impairment  Anxiety and depression   Damon Heeter has tolerated levetiracetam 1500 mg twice daily and phenytoin 300 mg at bedtime.  We will continue current treatment plan. Family reports that labs were updated yesterday with primary care and they think that AED levels were checked.  We will request those.  He will return for Dilantin level if not performed yesterday.  Mood seems to be improving on fluoxetine.  He will continue close follow-up with psychiatry and PCP.  Healthy lifestyle habits encouraged.  He will follow-up with [place] in 6 months.   I spent 20 minutes of face-to-face and non-face-to-face time with patient.  This included previsit chart review, lab review, study review, order entry, electronic health record documentation, patient education.    [name], MSN, FNP-C 12/27/2019, 10:47 AM  [facility] [address] [place], [place] [address] [phone]

## 2020-03-06 DIAGNOSIS — F439 Reaction to severe stress, unspecified: Secondary | ICD-10-CM | POA: Diagnosis not present

## 2020-04-01 DIAGNOSIS — K5900 Constipation, unspecified: Secondary | ICD-10-CM | POA: Diagnosis not present

## 2020-04-01 DIAGNOSIS — I10 Essential (primary) hypertension: Secondary | ICD-10-CM | POA: Diagnosis not present

## 2020-04-01 DIAGNOSIS — J302 Other seasonal allergic rhinitis: Secondary | ICD-10-CM | POA: Diagnosis not present

## 2020-04-01 DIAGNOSIS — M5136 Other intervertebral disc degeneration, lumbar region: Secondary | ICD-10-CM | POA: Diagnosis not present

## 2020-04-01 DIAGNOSIS — G40909 Epilepsy, unspecified, not intractable, without status epilepticus: Secondary | ICD-10-CM | POA: Diagnosis not present

## 2020-04-05 NOTE — Progress Notes (Signed)
I have reviewed and agreed above plan. 

## 2020-05-08 NOTE — Congregational Nurse Program (Signed)
CN office visit by patient's brother Y'Phi on his behalf, assisted by interpreter Diu [place]. Y'Phi states that his brother has complained of not being able to hear out of his right ear for about a weak.  He denies pain but does have a clear drainage from that ear.  CN scheduled appointment with Dr. Benjamine Mola on 05/10/2020 at 9:00 am.  Jake Michaelis RN, [place] Nurse [phone]

## 2020-05-10 DIAGNOSIS — H838X3 Other specified diseases of inner ear, bilateral: Secondary | ICD-10-CM | POA: Diagnosis not present

## 2020-05-10 DIAGNOSIS — H6983 Other specified disorders of Eustachian tube, bilateral: Secondary | ICD-10-CM | POA: Diagnosis not present

## 2020-05-10 DIAGNOSIS — H906 Mixed conductive and sensorineural hearing loss, bilateral: Secondary | ICD-10-CM | POA: Diagnosis not present

## 2020-05-22 ENCOUNTER — Telehealth: Payer: Self-pay

## 2020-05-22 ENCOUNTER — Telehealth: Payer: Self-pay | Admitting: Family Medicine

## 2020-05-22 DIAGNOSIS — F439 Reaction to severe stress, unspecified: Secondary | ICD-10-CM | POA: Diagnosis not present

## 2020-05-22 DIAGNOSIS — F329 Major depressive disorder, single episode, unspecified: Secondary | ICD-10-CM | POA: Diagnosis not present

## 2020-05-22 NOTE — Telephone Encounter (Addendum)
LMVM for Olga [facility] RN for pt.  Asking for earlier appt with Dr Krista Blue.   OV opening 1500 05-23-20 YY/MD. (on hold).

## 2020-05-22 NOTE — Telephone Encounter (Signed)
Phone call to [facility].  Patient's brother/caregiver came to CN office requesting an appointment with his Neurologist Dr. Krista Blue.  He stated patient does not want to take medicine because it tastes bitter. He is somewhat restless, weak, and not sleeping.  He his appointment at [facility] today. CN called [facility] and requested they have patient sign ROI in order to communicate with provider to determine if neurological evaluation is needed sooner than the scheduled appointment with [facility] on 06/27/2020.  CN called back following visit to discuss situation but provider not available.  Message left. Jake Michaelis RN, Congregational Nurse [phone]

## 2020-05-22 NOTE — Telephone Encounter (Signed)
[facility]ational RN called about pt.'s condition. She states pt. reports his medication tastes bitter, he is lethargic & restless. She is asking that he be seen sooner. Please advise.  Best contact: [phone]

## 2020-05-22 NOTE — Telephone Encounter (Signed)
Spoke to [facility] [place] care.  She states that pt is lethargic, restless, agitated, stated his medications taste bitter, not wanted to take them. He is not having seizures.  Will be seeing [facility] today at 345pm.  She will try to find more information.  ? Needing appt here??  Will let me know after seeing [facility].

## 2020-05-23 NOTE — Telephone Encounter (Signed)
Spoke to [employer].  She contacted [employer].  They referred to to pcp for eval.  No change in medication from there standpoint.  They will call back as needed.

## 2020-05-23 NOTE — Telephone Encounter (Signed)
I called and LMVM for [facility] for pt. Returning call.

## 2020-05-23 NOTE — Telephone Encounter (Signed)
Called home and mobile of pt brother, No answer home, and mobile NW#.

## 2020-06-06 DIAGNOSIS — H903 Sensorineural hearing loss, bilateral: Secondary | ICD-10-CM | POA: Diagnosis not present

## 2020-06-06 DIAGNOSIS — H838X3 Other specified diseases of inner ear, bilateral: Secondary | ICD-10-CM | POA: Diagnosis not present

## 2020-06-06 DIAGNOSIS — H6983 Other specified disorders of Eustachian tube, bilateral: Secondary | ICD-10-CM | POA: Diagnosis not present

## 2020-06-27 ENCOUNTER — Other Ambulatory Visit: Payer: Self-pay | Admitting: Neurology

## 2020-06-27 ENCOUNTER — Encounter: Payer: Self-pay | Admitting: Neurology

## 2020-06-27 ENCOUNTER — Other Ambulatory Visit: Payer: Self-pay

## 2020-06-27 ENCOUNTER — Ambulatory Visit (INDEPENDENT_AMBULATORY_CARE_PROVIDER_SITE_OTHER): Payer: Medicare Other | Admitting: Neurology

## 2020-06-27 VITALS — BP 129/77 | HR 97 | Ht 67.0 in | Wt 173.5 lb

## 2020-06-27 DIAGNOSIS — S069X9S Unspecified intracranial injury with loss of consciousness of unspecified duration, sequela: Secondary | ICD-10-CM | POA: Diagnosis not present

## 2020-06-27 DIAGNOSIS — G40209 Localization-related (focal) (partial) symptomatic epilepsy and epileptic syndromes with complex partial seizures, not intractable, without status epilepticus: Secondary | ICD-10-CM | POA: Insufficient documentation

## 2020-06-27 MED ORDER — LAMOTRIGINE 100 MG PO TABS: 100.0000 mg | ORAL_TABLET | Freq: Two times a day (BID) | ORAL | 4 refills | Status: DC

## 2020-06-27 MED ORDER — LAMOTRIGINE 25 MG PO TABS: ORAL_TABLET | ORAL | 0 refills | Status: DC

## 2020-06-27 MED ORDER — LEVETIRACETAM 1000 MG PO TABS: 2000.0000 mg | ORAL_TABLET | Freq: Two times a day (BID) | ORAL | 4 refills | Status: DC

## 2020-06-27 NOTE — Patient Instructions (Signed)
Increase Keppra to 1000mg  2 tabs twice a day.  Add on Lamotrigine, titrate up to 100mg  twice a day.  Taper off dilantin ER 100mg ,   2 tabs every night for one week, then one tab every night for one week, then stop.

## 2020-06-27 NOTE — Progress Notes (Signed)
Chief Complaint  Patient presents with  . Follow-up    He is here with his cousin and an interpreter from [facility]. His cousin reports him having about one seizure each month. On 06/26/20, he had three seizures in one day, only lasting a few minutes each event. The patient returned to baseline. Denies missing any doses of Keppra 1500mg  BID or Dilantin 300mg  QHS.      ASSESSMENT AND PLAN:  60 year old [place] ,  seizure disorder History of traumatic brain injury Memory loss with agitation  He continue have recurrent seizure when compliant with Keppra 1500 mg twice a day, Dilantin 300 mg daily  Check laboratory evaluation including Keppra and Dilantin level  Increase Keppra to 1000 mg 2 tablets twice a day, tapering off Dilantin, add on lamotrigine, titrating to 100 mg twice a day PTSD  On Prozac 40 mg daily  DIAGNOSTIC DATA (LABS, IMAGING, TESTING) - I reviewed patient records, labs, notes, testing and imaging myself where available.  Laboratory evaluations in June 2021: Dilantin level 6.5, in 2020 was 18.9, BMP showed mild elevated creatinine 1.3, CBC showed normal hemoglobin of 13  CT head without contrast May 2021: No acute intracranial abnormality, supratentorium small vessel disease,  CT cervical no acute fracture  EEG on October 03, 2018 adductor Hortense Ramal This study is suggestive of potential epileptogenicity in left frontotemporal region as well as cortical dysfunction in the same region. No seizures were seen throughout the recording.    HISTORY OF PRESENT ILLNESS:  [name] is a 60 y.o. male   Past medical history of traumatic brain injury, was involved in [place], [place] before he immigrated to [place] in 1986, initially he was able to work at [employer], had steady decline in functional status, suffered PTSD, some angry outbursts, gait abnormality, recurrent seizures, currently on disability  For PTSD he is taking Prozac 40 mg daily Seizure,  he is on Keppra 1000 mg 1-1/2 tablets twice a day, Dilantin 100 mg 3 tablets every night, occasionally out burst, but overall improved,  Dilantin level was 8.6 in June 2021,  He lives with his roommates and his brother, who take care of him, who make sure he takes his medicine regularly  He continues to have recurrent seizures at least once a month, had a 3 seizure on June 26, 2020, about 2 hours apart, was awake in between, he has no warning signs before he went into tonic-clonic seizure  REVIEW OF SYSTEMS: Out of a complete 14 system review of symptoms, the patient complains only of the following symptoms, seizures, anxiety, depression, anger outburst, and all other reviewed systems are negative.   ALLERGIES: No Known Allergies   HOME MEDICATIONS: Outpatient Medications Prior to Visit  Medication Sig Dispense Refill  . FLUoxetine (PROZAC) 40 MG capsule Take 40 mg by mouth daily.    [name] levETIRAcetam (KEPPRA) 1000 MG tablet Take 1,500 mg by mouth 2 (two) times daily.    [name] lisinopril (ZESTRIL) 10 MG tablet Take 10 mg by mouth daily.    . phenytoin (DILANTIN) 100 MG ER capsule Take 3 capsules (300 mg total) by mouth at bedtime. 90 capsule 11  . tamsulosin (FLOMAX) 0.4 MG CAPS capsule Take 0.4 mg by mouth daily.     No facility-administered medications prior to visit.     PAST MEDICAL HISTORY: Past Medical History:  Diagnosis Date  . Anxiety   . Back pain    low back pain/ compressed disk in  lower back.   . CKD (chronic kidney disease), stage II   . Depression   . Hypertension   . Language barrier 03-01-13   Speaks very little English,primary-[place]namese "[name] Dialect"  . Seizures ([place])    due head trauma-seizures are less, but occurs from mild to more severe, which causes agitation to mental state.  [name] TBI (traumatic brain injury) ([place])    CARE FOR BY BROTHER     PAST SURGICAL HISTORY: Past Surgical History:  Procedure Laterality Date  . ABDOMINAL SURGERY    .  COLONOSCOPY N/A 03/17/2013   Procedure: COLONOSCOPY;  Surgeon: [name], MD;  Location: [facility];  Service: Endoscopy;  Laterality: N/A;  . NO PAST SURGERIES     ? one surgery in [place]-? what, has abdominal scar.     FAMILY HISTORY: Family History  Problem Relation Age of Onset  . Seizures Mother        per his nonbiological brother     SOCIAL HISTORY: Social History   Socioeconomic History  . Marital status: Single    Spouse name: Not on file  . Number of children: Not on file  . Years of education: 4  . Highest education level: Not on file  Occupational History    Employer: UNEMPLOYED  Tobacco Use  . Smoking status: Former Smoker    Quit date: 03/02/1989    Years since quitting: 31.3  . Smokeless tobacco: Never Used  Substance and Sexual Activity  . Alcohol use: No  . Drug use: No  . Sexual activity: Not Currently  Other Topics Concern  . Not on file  Social History Narrative   ** Merged History Encounter **    Patient is single.   Patient is disabled.   Patient has a 4th grade education.   Patient drinks two cups of coffee daily.   Social Determinants of Health   Financial Resource Strain: Not on file  Food Insecurity: Not on file  Transportation Needs: Not on file  Physical Activity: Not on file  Stress: Not on file  Social Connections: Not on file  Intimate Partner Violence: Not on file      PHYSICAL EXAM  Vitals:   06/27/20 1505  BP: 129/77  Pulse: 97  Weight: 173 lb 8 oz (78.7 kg)  Height: 5\' 7"  (1.702 m)   Body mass index is 27.17 kg/m.  PHYSICAL EXAMNIATION:  Gen: NAD, conversant, well nourised, well groomed                     Cardiovascular: Regular rate rhythm, no peripheral edema, warm, nontender. Eyes: Conjunctivae clear without exudates or hemorrhage Neck: Supple, no carotid bruits. Pulmonary: Clear to auscultation bilaterally   NEUROLOGICAL EXAM:  MENTAL STATUS: Speech/Cognition: Awake, alert, normal speech,  oriented to history taking and casual conversation.  CRANIAL NERVES: CN II: Visual fields are full to confrontation.  Pupils are round equal and briskly reactive to light. CN III, IV, VI: extraocular movement are normal. No ptosis. CN V: Facial sensation is intact to light touch. CN VII: Face is symmetric with normal eye closure and smile. CN VIII: Hearing is normal to casual conversation CN IX, X: Palate elevates symmetrically. Phonation is normal. CN XI: Head turning and shoulder shrug are intact CN XII: Tongue is midline with normal movements and no atrophy.  MOTOR: Muscle bulk and tone are normal. Muscle strength is normal.  REFLEXES: Reflexes are 2  and symmetric at the biceps, triceps, knees and ankles.  Plantar responses are flexor.  SENSORY: Intact to light touch, pinprick, positional and vibratory sensation at fingers and toes.  COORDINATION: There is no trunk or limb ataxia.    GAIT/STANCE: Need push-up to get up from seated position, cautious unsteady     Marcial Pacas, M.D. Ph.D.  [facility] [place], [place] [address] Phone: [phone] Fax:      [phone]

## 2020-07-02 ENCOUNTER — Telehealth: Payer: Self-pay | Admitting: Neurology

## 2020-07-02 LAB — COMPREHENSIVE METABOLIC PANEL
ALT: 18 IU/L (ref 0–44)
AST: 20 IU/L (ref 0–40)
Albumin/Globulin Ratio: 1.2 (ref 1.2–2.2)
Albumin: 4.4 g/dL (ref 3.8–4.8)
Alkaline Phosphatase: 92 IU/L (ref 44–121)
BUN/Creatinine Ratio: 12 (ref 10–24)
BUN: 14 mg/dL (ref 8–27)
Bilirubin Total: 0.2 mg/dL (ref 0.0–1.2)
CO2: 23 mmol/L (ref 20–29)
Calcium: 9.1 mg/dL (ref 8.6–10.2)
Chloride: 98 mmol/L (ref 96–106)
Creatinine, Ser: 1.2 mg/dL (ref 0.76–1.27)
Globulin, Total: 3.7 g/dL (ref 1.5–4.5)
Glucose: 87 mg/dL (ref 65–99)
Potassium: 4 mmol/L (ref 3.5–5.2)
Sodium: 138 mmol/L (ref 134–144)
Total Protein: 8.1 g/dL (ref 6.0–8.5)
eGFR: 69 mL/min/{1.73_m2} (ref >59)

## 2020-07-02 LAB — CBC WITH DIFFERENTIAL
Basophils Absolute: 0 10*3/uL (ref 0.0–0.2)
Basos: 1 %
EOS (ABSOLUTE): 0.4 10*3/uL (ref 0.0–0.4)
Eos: 5 %
Hematocrit: 40.6 % (ref 37.5–51.0)
Hemoglobin: 13.8 g/dL (ref 13.0–17.7)
Immature Grans (Abs): 0 10*3/uL (ref 0.0–0.1)
Immature Granulocytes: 0 %
Lymphocytes Absolute: 1.6 10*3/uL (ref 0.7–3.1)
Lymphs: 20 %
MCH: 30.9 pg (ref 26.6–33.0)
MCHC: 34 g/dL (ref 31.5–35.7)
MCV: 91 fL (ref 79–97)
Monocytes Absolute: 1.4 10*3/uL — ABNORMAL HIGH (ref 0.1–0.9)
Monocytes: 16 %
Neutrophils Absolute: 4.8 10*3/uL (ref 1.4–7.0)
Neutrophils: 58 %
RBC: 4.46 x10E6/uL (ref 4.14–5.80)
RDW: 13.1 % (ref 11.6–15.4)
WBC: 8.3 10*3/uL (ref 3.4–10.8)

## 2020-07-02 LAB — RPR: RPR Ser Ql: NONREACTIVE

## 2020-07-02 LAB — PHENYTOIN LEVEL, TOTAL: Phenytoin (Dilantin), Serum: 13.5 ug/mL (ref 10.0–20.0)

## 2020-07-02 LAB — LEVETIRACETAM LEVEL: Levetiracetam Lvl: 43.8 ug/mL — ABNORMAL HIGH (ref 10.0–40.0)

## 2020-07-02 NOTE — Telephone Encounter (Signed)
Please call patient, laboratory evaluation showed mild elevated Keppra level 43.8, likely it was not a trough level  Rest of the laboratory evaluation showed no significant abnormality  Continue with the plan as discussed during his office visit

## 2020-07-02 NOTE — Telephone Encounter (Signed)
I was able to get help through interpreter services. They connected me with Y'Keo at [phone] who speaks the patient's language. He provided him with the lab results and to continue his current treatment plan.

## 2020-07-02 NOTE — Telephone Encounter (Signed)
The language line does not have any interpreters who speak Physiological scientist. I called interpreter services at [phone]. They will find someone and have them contact me back. We will complete a conference call with the results.

## 2020-08-07 DIAGNOSIS — F329 Major depressive disorder, single episode, unspecified: Secondary | ICD-10-CM | POA: Diagnosis not present

## 2020-08-07 DIAGNOSIS — F439 Reaction to severe stress, unspecified: Secondary | ICD-10-CM | POA: Diagnosis not present

## 2020-09-18 ENCOUNTER — Ambulatory Visit (INDEPENDENT_AMBULATORY_CARE_PROVIDER_SITE_OTHER): Payer: Medicare Other

## 2020-09-18 ENCOUNTER — Encounter (HOSPITAL_COMMUNITY): Payer: Self-pay | Admitting: *Deleted

## 2020-09-18 ENCOUNTER — Ambulatory Visit (HOSPITAL_COMMUNITY)
Admission: EM | Admit: 2020-09-18 | Discharge: 2020-09-18 | Disposition: A | Discharge status: Home / Self Care | Payer: Medicare Other | Attending: Internal Medicine | Admitting: Internal Medicine

## 2020-09-18 ENCOUNTER — Other Ambulatory Visit: Payer: Self-pay

## 2020-09-18 DIAGNOSIS — L03116 Cellulitis of left lower limb: Secondary | ICD-10-CM | POA: Insufficient documentation

## 2020-09-18 DIAGNOSIS — S91332A Puncture wound without foreign body, left foot, initial encounter: Secondary | ICD-10-CM

## 2020-09-18 DIAGNOSIS — R3911 Hesitancy of micturition: Secondary | ICD-10-CM | POA: Diagnosis not present

## 2020-09-18 DIAGNOSIS — S90852A Superficial foreign body, left foot, initial encounter: Secondary | ICD-10-CM | POA: Diagnosis not present

## 2020-09-18 LAB — POCT URINALYSIS DIPSTICK, ED / UC
Bilirubin Urine: NEGATIVE
Glucose, UA: NEGATIVE mg/dL
Hgb urine dipstick: NEGATIVE
Ketones, ur: NEGATIVE mg/dL
Leukocytes,Ua: NEGATIVE
Nitrite: NEGATIVE
Protein, ur: NEGATIVE mg/dL
Specific Gravity, Urine: 1.025 (ref 1.005–1.030)
Urobilinogen, UA: 0.2 mg/dL (ref 0.0–1.0)
pH: 5 (ref 5.0–8.0)

## 2020-09-18 MED ORDER — CEPHALEXIN 500 MG PO CAPS: 500.0000 mg | ORAL_CAPSULE | Freq: Four times a day (QID) | ORAL | 0 refills | Status: AC

## 2020-09-18 NOTE — Congregational Nurse Program (Signed)
Following phone call from patient's brother interpreter Diu [place] and CN accompanied patient and his brother to [facility] for pain on bottom of left foot present several weeks but worsening to the point he is having difficulty bearing weight.  First available appointment with PCP Dr. Jeanie Cooks is August 15,2022 at 3:45 pm.  Scheduled appointment in case he needs follow-up.  Jake Michaelis RN, Congregational Nurse [phone]

## 2020-09-18 NOTE — ED Provider Notes (Signed)
[place]    CSN: [id number] Arrival date & time: 09/18/20  1337      History   Chief Complaint Chief Complaint  Patient presents with   Foot Injury    HPI [name] is a 60 y.o. male.   Patient presents with possible foreign body to plantar surface of left foot.  Patient's interpreter states [place] and thinks he stepped on something approximately 5 years ago.  Patient has had intermittent pain at area of puncture wound and has worsened over the past 8 weeks.  Per interpreter, patient has attempted removal but has been unsuccessful.  Patient is not sure what is in his foot.  Denies seeing any increased redness, swelling, fever, purulent drainage.   Patient also has concerns for urinary hesitancy that has been present for approximately 1 year.  Denies any urinary burning or frequency.  Patient states that he has a hard time urinating and feels like his bladder is not completely empty when he does.  Denies any back pain or lower abdominal pain.  Denies any fevers.  Patient denies having a PCP.      Foot Injury  Past Medical History:  Diagnosis Date   Anxiety    Back pain    low back pain/ compressed disk in lower back.    CKD (chronic kidney disease), stage II    Depression    Hypertension    Language barrier 03-01-13   Speaks very little English,primary-Vietnamese "[name] Dialect"   Seizures ([place])    due head trauma-seizures are less, but occurs from mild to more severe, which causes agitation to mental state.   TBI (traumatic brain injury) ([place])    CARE FOR BY BROTHER    Patient Active Problem List   Diagnosis Date Noted   Partial symptomatic epilepsy with complex partial seizures, not intractable, without status epilepticus ([place]) 06/27/2020   TBI (traumatic brain injury) ([place])    Fall 07/02/2019   CKD (chronic kidney disease) stage 3, GFR 30-59 ml/min (HCC) 07/02/2019   Cognitive impairment 01/10/2019   Prolonged  seizure ([place]) 09/30/2018   Noncompliance with medication regimen 11/22/2017   Altered mental status 03/27/2016   Sepsis ([place]) 07/03/2015   CKD (chronic kidney disease), stage II    Abdominal pain    Seizures ([place])    Encounter for therapeutic drug monitoring 01/03/2014   Partial epilepsy with impairment of consciousness, intractable ([place]) 03/13/2013   Generalized convulsive epilepsy with intractable epilepsy ([place]) 03/13/2013   Encounter for long-term (current) use of other medications 03/13/2013   Weakness generalized 08/05/2012   Pneumonia 08/04/2012   Essential hypertension 08/04/2012   Seizure ([place]) 08/04/2012   Depression 08/04/2012   Lower extremity pain 12/21/2011   Dilantin toxicity 12/19/2011   Seizure disorder ([place]) 12/19/2011   Nicole Kindred will get Wo within enhancing 9 101 which it now 500 or iron ad lib. there is no improvement in right 12/18/2011   Seizure ([place]) 12/17/2011   Altered mental state 12/17/2011    Past Surgical History:  Procedure Laterality Date   ABDOMINAL SURGERY     COLONOSCOPY N/A 03/17/2013   Procedure: COLONOSCOPY;  Surgeon: [name], MD;  Location: [facility];  Service: Endoscopy;  Laterality: N/A;   NO PAST SURGERIES     ? one surgery in [place]-? what, has abdominal scar.       Home Medications    Prior to Admission medications   Medication Sig Start Date End Date  Taking? Authorizing Provider  cephALEXin (KEFLEX) 500 MG capsule Take 1 capsule (500 mg total) by mouth 4 (four) times daily for 5 days. 09/18/20 09/23/20 Yes Odis Luster, FNP  FLUoxetine (PROZAC) 40 MG capsule Take 40 mg by mouth daily.    [provider]  lamoTRIgine (LAMICTAL) 100 MG tablet Take 1 tablet (100 mg total) by mouth 2 (two) times daily. 06/27/20   Marcial Pacas, MD  lamoTRIgine (LAMICTAL) 25 MG tablet 1 tab bid x one week 2 tab bid x 2nd week 3 tab bid x 3rd week 06/27/20   Marcial Pacas, MD  levETIRAcetam (KEPPRA) 1000 MG tablet Take 2 tablets (2,000 mg  total) by mouth 2 (two) times daily. 06/27/20   Marcial Pacas, MD  lisinopril (ZESTRIL) 10 MG tablet Take 10 mg by mouth daily. 06/26/19   [provider]  phenytoin (DILANTIN) 100 MG ER capsule Take 3 capsules (300 mg total) by mouth at bedtime. 07/05/19   Suzzanne Cloud, NP  tamsulosin (FLOMAX) 0.4 MG CAPS capsule Take 0.4 mg by mouth daily. 06/30/19   [provider]    Family History Family History  Problem Relation Age of Onset   Seizures Mother        per his nonbiological brother    Social History Social History   Tobacco Use   Smoking status: Former    Types: Cigarettes    Quit date: 03/02/1989    Years since quitting: 31.5   Smokeless tobacco: Never  Substance Use Topics   Alcohol use: No   Drug use: No     Allergies   Patient has no known allergies.   Review of Systems Review of Systems Per HPI  Physical Exam Triage Vital Signs ED Triage Vitals [09/18/20 1433]  Enc Vitals Group     BP 102/66     Pulse Rate 71     Resp 20     Temp 98.5 F (36.9 C)     Temp src      SpO2 100 %     Weight      Height      Head Circumference      Peak Flow      Pain Score 10     Pain Loc      Pain Edu?      Excl. in [place]?    No data found.  Updated Vital Signs BP 102/66   Pulse 71   Temp 98.5 F (36.9 C)   Resp 20   SpO2 100%   Visual Acuity Right Eye Distance:   Left Eye Distance:   Bilateral Distance:    Right Eye Near:   Left Eye Near:    Bilateral Near:     Physical Exam Constitutional:      Appearance: Normal appearance.  HENT:     Head: Normocephalic and atraumatic.  Eyes:     Extraocular Movements: Extraocular movements intact.     Conjunctiva/sclera: Conjunctivae normal.  Cardiovascular:     Rate and Rhythm: Normal rate and regular rhythm.     Pulses: Normal pulses.     Heart sounds: Normal heart sounds.  Pulmonary:     Effort: Pulmonary effort is normal. No respiratory distress.     Breath sounds: Normal breath sounds.   Abdominal:     General: Abdomen is flat.     Palpations: Abdomen is soft.     Tenderness: There is no abdominal tenderness.  Musculoskeletal:  General: No tenderness.  Skin:    General: Skin is warm and dry.     Findings: Wound present.     Comments: Puncture wound to plantar surface of left foot.  Appears to have signs of infection including yellow discoloration surrounding puncture wound.  No purulent drainage or redness noted.  No swelling noted.  Neurological:     General: No focal deficit present.     Mental Status: He is alert and oriented to person, place, and time. Mental status is at baseline.  Psychiatric:        Mood and Affect: Mood normal.        Behavior: Behavior normal.        Thought Content: Thought content normal.        Judgment: Judgment normal.       UC Treatments / Results  Labs (all labs ordered are listed, but only abnormal results are displayed) Labs Reviewed  URINE CULTURE  POCT URINALYSIS DIPSTICK, ED / UC    EKG   Radiology DG Foot Complete Left  Result Date: 09/18/2020 CLINICAL DATA:  Foreign body in left foot for 5 years EXAM: LEFT FOOT - COMPLETE 3+ VIEW COMPARISON:  11/05/2017 FINDINGS: Frontal, oblique, and lateral views of the left foot are obtained. No fracture, subluxation, or dislocation. Joint spaces are well preserved. Soft tissues are normal. No radiopaque foreign body. IMPRESSION: 1. No fracture or radiopaque foreign body. Electronically Signed   By: Randa Ngo M.D.   On: 09/18/2020 15:25    Procedures Procedures (including critical care time)  Medications Ordered in UC Medications - No data to display  Initial Impression / Assessment and Plan / UC Course  I have reviewed the triage vital signs and the nursing notes.  Pertinent labs & imaging results that were available during my care of the patient were reviewed by me and considered in my medical decision making (see chart for details).     Left foot x-ray did  not show signs of foreign body.  Will treat with cephalexin x5 days to treat for infection.  Patient advised to use warm Epson salt soaks as well.  Will refer to podiatry for further evaluation and management.   Urinalysis negative for any signs of urinary tract infection.  Urine culture is pending to confirm.  Cephalexin for dual treatment of skin infection and possibility of UTI.  Suspect enlarged prostate as cause of urinary hesitancy.  Due to patient stating that he does not have a PCP, PCP assistance was requested for patient for further evaluation of urinary hesitancy and possibility of enlarged prostate.  Discussed strict return precautions. Patient verbalized understanding and is agreeable with plan.   Interpreter used throughout patient interaction. Final Clinical Impressions(s) / UC Diagnoses   Final diagnoses:  Puncture wound of left foot, initial encounter  Cellulitis of left foot  Urinary hesitancy     Discharge Instructions      Please call provided contact information for [facility] today for further evaluation and management.  You have been prescribed antibiotic to treat infection of the foot.  Please use warm Epson salt soaks to foot for 15 minutes at a time 2-3 times daily.  Please go to the hospital if pain significantly worsens or if pus, redness, swelling develop.  Primary care assistance has been requested for you.  Someone from [facility] health will reach out to you and schedule primary care appointment for further evaluation of your urinary problems.  ED Prescriptions     Medication Sig Dispense Auth. Provider   cephALEXin (KEFLEX) 500 MG capsule Take 1 capsule (500 mg total) by mouth 4 (four) times daily for 5 days. 28 capsule Odis Luster, FNP      PDMP not reviewed this encounter.   Odis Luster, [place] 09/18/20 [phone]

## 2020-09-18 NOTE — Discharge Instructions (Addendum)
Please call provided contact information for [facility] today for further evaluation and management.  You have been prescribed antibiotic to treat infection of the foot.  Please use warm Epson salt soaks to foot for 15 minutes at a time 2-3 times daily.  Please go to the hospital if pain significantly worsens or if pus, redness, swelling develop.  Primary care assistance has been requested for you.  Someone from [facility] health will reach out to you and schedule primary care appointment for further evaluation of your urinary problems.

## 2020-09-18 NOTE — ED Triage Notes (Signed)
Pt's interpreter reports Pt stepped on something a long time ago . Pt has recently started to pick at the area. Skin is black at site bottom of Left foot

## 2020-09-19 LAB — URINE CULTURE: Culture: <10000 — AB

## 2020-09-26 ENCOUNTER — Encounter: Payer: Self-pay | Admitting: Podiatry

## 2020-09-26 ENCOUNTER — Other Ambulatory Visit: Payer: Self-pay

## 2020-09-26 ENCOUNTER — Ambulatory Visit (INDEPENDENT_AMBULATORY_CARE_PROVIDER_SITE_OTHER): Payer: Medicare Other | Admitting: Podiatry

## 2020-09-26 DIAGNOSIS — Q828 Other specified congenital malformations of skin: Secondary | ICD-10-CM

## 2020-09-26 DIAGNOSIS — M799 Soft tissue disorder, unspecified: Secondary | ICD-10-CM

## 2020-09-26 DIAGNOSIS — L859 Epidermal thickening, unspecified: Secondary | ICD-10-CM | POA: Diagnosis not present

## 2020-09-26 NOTE — Progress Notes (Signed)
Subjective:  Patient ID: [name], male    DOB: 03-10-1960,  MRN: [id number] HPI Chief Complaint  Patient presents with   Foot Pain    Plantar foot left - darkened callused area x 20 years, hurts to walk   New Patient (Initial Visit)    Est pt 10/2017    60 y.o. male presents with the above complaint.   ROS: Denies fever chills nausea vomiting muscle aches pains calf pain back pain chest pain shortness of breath.  Presents today with his brother and interpreter  Past Medical History:  Diagnosis Date   Anxiety    Back pain    low back pain/ compressed disk in lower back.    CKD (chronic kidney disease), stage II    Depression    Hypertension    Language barrier 03-01-13   Speaks very little English,primary-Vietnamese "[name] Dialect"   Seizures ([place])    due head trauma-seizures are less, but occurs from mild to more severe, which causes agitation to mental state.   TBI (traumatic brain injury) [facility])    CARE FOR BY BROTHER   Past Surgical History:  Procedure Laterality Date   ABDOMINAL SURGERY     COLONOSCOPY N/A 03/17/2013   Procedure: COLONOSCOPY;  Surgeon: [name], MD;  [facility];  Service: Endoscopy;  Laterality: N/A;   NO PAST SURGERIES     ? one surgery in [place]-? what, has abdominal scar.    Current Outpatient Medications:    FLUoxetine (PROZAC) 40 MG capsule, Take 40 mg by mouth daily., Disp: , Rfl:    lamoTRIgine (LAMICTAL) 100 MG tablet, Take 1 tablet (100 mg total) by mouth 2 (two) times daily., Disp: 180 tablet, Rfl: 4   lamoTRIgine (LAMICTAL) 25 MG tablet, 1 tab bid x one week 2 tab bid x 2nd week 3 tab bid x 3rd week, Disp: 84 tablet, Rfl: 0   levETIRAcetam (KEPPRA) 1000 MG tablet, Take 2 tablets (2,000 mg total) by mouth 2 (two) times daily., Disp: 360 tablet, Rfl: 4   lisinopril (ZESTRIL) 10 MG tablet, Take 10 mg by mouth daily., Disp: , Rfl:    phenytoin (DILANTIN) 100 MG ER capsule, Take 3 capsules (300 mg total) by mouth at  bedtime., Disp: 90 capsule, Rfl: 11   tamsulosin (FLOMAX) 0.4 MG CAPS capsule, Take 0.4 mg by mouth daily., Disp: , Rfl:   No Known Allergies Review of Systems Objective:  There were no vitals filed for this visit.  General: Well developed, nourished, in no acute distress, alert and oriented x3   Dermatological: Skin is warm, dry and supple bilateral. Nails x 10 are well maintained; remaining integument appears unremarkable at this time. There are no open sores, no preulcerative lesions, no rash or signs of infection present.  Hyperpigmented lesion central aspect of the left foot measuring 0.8 cm in diameter that has been present for greater than 30 years that is painful on palpation.  Vascular: Dorsalis Pedis artery and Posterior Tibial artery pedal pulses are 2/4 bilateral with immedate capillary fill time. Pedal hair growth present. No varicosities and no lower extremity edema present bilateral.   Neruologic: Grossly intact via light touch bilateral. Vibratory intact via tuning fork bilateral. Protective threshold with Semmes Wienstein monofilament intact to all pedal sites bilateral. Patellar and Achilles deep tendon reflexes 2+ bilateral. No Babinski or clonus noted bilateral.   Musculoskeletal: No gross boney pedal deformities bilateral. No pain, crepitus, or limitation noted with foot and ankle range of motion bilateral.  Muscular strength 5/5 in all groups tested bilateral.  Gait: Unassisted, Nonantalgic.    Radiographs:  I reviewed radiographs that were taken just a few days ago not demonstrate any type of foreign objects or bony projections.  Assessment & Plan:   Assessment: Painful soft tissue lesion plantar aspect left foot.  Plan: At this point since this is painful has been present for so long I decided to excise it.  This hyperpigmented lesion was easily excised and separated from the dermal epidermal junction after local anesthetic was administered and it was curettaged.   It was sent for pathologic evaluation.  Dressed the area today dressed a compressive dressing he was provided both oral and written home-going instructions for the care and soaking of the foot he understands this and is amendable to it after explained to him in his native language.     Carren Blakley [place] [place], [place]

## 2020-09-26 NOTE — Patient Instructions (Signed)
Betadine Soak Instructions  Purchase an 8 oz. bottle of BETADINE solution (Povidone)  THE DAY AFTER THE PROCEDURE  Place 1 tablespoon of betadine solution in a quart of warm tap water.  Submerge your foot or feet with outer bandage intact for the initial soak; this will allow the bandage to become moist and wet for easy lift off.  Once you remove your bandage, continue to soak in the solution for 20 minutes.  This soak should be done twice a day.  Next, remove your foot or feet from solution, blot dry the affected area and cover.  You may use a band aid large enough to cover the area or use gauze and tape.  Apply other medications to the area as neosporin.  IF YOUR SKIN BECOMES IRRITATED WHILE USING THESE INSTRUCTIONS, IT IS OKAY TO SWITCH TO EPSOM SALTS AND WATER OR WHITE VINEGAR AND WATER.

## 2020-10-10 ENCOUNTER — Encounter: Payer: Self-pay | Admitting: Podiatry

## 2020-10-10 ENCOUNTER — Ambulatory Visit (INDEPENDENT_AMBULATORY_CARE_PROVIDER_SITE_OTHER): Payer: Medicare Other | Admitting: Podiatry

## 2020-10-10 ENCOUNTER — Other Ambulatory Visit: Payer: Self-pay

## 2020-10-10 DIAGNOSIS — B070 Plantar wart: Secondary | ICD-10-CM

## 2020-10-11 NOTE — Progress Notes (Signed)
He presents today with his brother and an interpreter for follow-up of excision lesion plantar aspect of his left foot.  He states that is feeling much better through their interpreter he states that he is keeping the area clean and dressing it daily.  Objective: Vital signs are stable he is alert and oriented x3 there is no erythema cellulitis drainage or odor the wound appears to be dried up.  Pathology does demonstrate what appears to be involuting verruca.  Assessment: Proluton verruca.  Plan: I encouraged him to follow-up with me with any questions or concerns.

## 2020-10-23 DIAGNOSIS — F329 Major depressive disorder, single episode, unspecified: Secondary | ICD-10-CM | POA: Diagnosis not present

## 2020-10-23 DIAGNOSIS — F439 Reaction to severe stress, unspecified: Secondary | ICD-10-CM | POA: Diagnosis not present

## 2021-01-09 ENCOUNTER — Encounter: Payer: Self-pay | Admitting: Neurology

## 2021-01-09 ENCOUNTER — Ambulatory Visit (INDEPENDENT_AMBULATORY_CARE_PROVIDER_SITE_OTHER): Payer: Medicare Other | Admitting: Neurology

## 2021-01-09 VITALS — BP 138/79 | HR 93 | Ht 67.0 in | Wt 176.5 lb

## 2021-01-09 DIAGNOSIS — R569 Unspecified convulsions: Secondary | ICD-10-CM

## 2021-01-09 DIAGNOSIS — G40209 Localization-related (focal) (partial) symptomatic epilepsy and epileptic syndromes with complex partial seizures, not intractable, without status epilepticus: Secondary | ICD-10-CM

## 2021-01-09 DIAGNOSIS — M629 Disorder of muscle, unspecified: Secondary | ICD-10-CM

## 2021-01-09 DIAGNOSIS — S069X9S Unspecified intracranial injury with loss of consciousness of unspecified duration, sequela: Secondary | ICD-10-CM | POA: Diagnosis not present

## 2021-01-09 DIAGNOSIS — S069X9A Unspecified intracranial injury with loss of consciousness of unspecified duration, initial encounter: Secondary | ICD-10-CM | POA: Insufficient documentation

## 2021-01-09 DIAGNOSIS — M899 Disorder of bone, unspecified: Secondary | ICD-10-CM | POA: Diagnosis not present

## 2021-01-09 MED ORDER — LAMOTRIGINE 100 MG PO TABS: 200.0000 mg | ORAL_TABLET | Freq: Two times a day (BID) | ORAL | 4 refills | Status: DC

## 2021-01-09 NOTE — Patient Instructions (Signed)
Keep levetiracetam 1000mg  2 tabs twice a day  Increase lamotrigine to 100mg  2 tablets twice a day.  Meds ordered this encounter  Medications   lamoTRIgine (LAMICTAL) 100 MG tablet    Sig: Take 2 tablets (200 mg total) by mouth 2 (two) times daily.    Dispense:  360 tablet    Refill:  4

## 2021-01-09 NOTE — Progress Notes (Signed)
Chief Complaint  Patient presents with   Follow-up    Room 14 w/ his brother, [name] and [facility] interpreter. He is taking lamotrigine 100mg , one tab BID and levetiracetam 1000mg , 2 tabs BID. His brother reports having one seizure last week. Denies any missed doses of medication.     ASSESSMENT AND PLAN:  60 year old [place] ,  seizure disorder  Continue have recurrent seizure while taking Keppra 2000 mg daily, lamotrigine 200 mg daily History of traumatic brain injury Memory loss with agitation  Keep Keppra 1000 mg 2 tablets twice a day,  Increase lamotrigine to 100 mg 2 tablets twice a day  Laboratory evaluations including lamotrigine level  PTSD  On Prozac 40 mg daily  DIAGNOSTIC DATA (LABS, IMAGING, TESTING) - I reviewed patient records, labs, notes, testing and imaging myself where available.  Laboratory evaluations in June 2021: Dilantin level 6.5, in 2020 was 18.9, BMP showed mild elevated creatinine 1.3, CBC showed normal hemoglobin of 13  CT head without contrast May 2021: No acute intracranial abnormality, supratentorium small vessel disease,  CT cervical no acute fracture  EEG on October 03, 2018 adductor Hortense Ramal This study is suggestive of potential epileptogenicity in left frontotemporal region as well as cortical dysfunction in the same region. No seizures were seen throughout the recording.     HISTORY OF PRESENT ILLNESS:  [name] is a 60 y.o. male was seen initially on June 27 2020  Past medical history of traumatic brain injury, was involved in [place], [place] before he immigrated to [place] in 1986, initially he was able to work at [employer], had steady decline in functional status, suffered PTSD, some angry outbursts, gait abnormality, recurrent seizures, currently on disability  For PTSD he is taking Prozac 40 mg daily Seizure, he is on Keppra 1000 mg 1-1/2 tablets twice a day, Dilantin 100 mg 3 tablets every night, occasionally  out burst, but overall improved,  Dilantin level was 8.6 in June 2021,  He lives with his roommates and his brother, who take care of him, who make sure he takes his medicine regularly  He continues to have recurrent seizures at least once a month, had a 3 seizure on June 26, 2020, about 2 hours apart, was awake in between, he has no warning signs before he went into tonic-clonic seizure  UPDATE Jan 09 2021: He continues to have seizure 1 to twice each month, minor seizure, often recovered well, tolerating lamotrigine 100 mg twice a day, and higher dose Keppra 1000 mg 2 tablets twice a day, weaned off Dilantin successfully, mood seems to better with lamotrigine   PHYSICAL EXAM  Vitals:   01/09/21 1504  BP: 138/79  Pulse: 93  Weight: 176 lb 8 oz (80.1 kg)  Height: 5\' 7"  (1.702 m)   Body mass index is 27.64 kg/m.  PHYSICAL EXAMNIATION:  Gen: NAD, conversant, well nourised, well groomed        NEUROLOGICAL EXAM:  MENTAL STATUS: Speech/Cognition: Rely on his interpreter for history  CRANIAL NERVES: CN II: Visual fields are full to confrontation.  Pupils are round equal and briskly reactive to light. CN III, IV, VI: extraocular movement are normal. No ptosis. CN V: Facial sensation is intact to light touch. CN VII: Face is symmetric with normal eye closure and smile. CN VIII: Hearing is normal to casual conversation CN IX, X: Palate elevates symmetrically. Phonation is normal. CN XI: Head turning and shoulder shrug are intact   MOTOR:  Muscle bulk and tone are normal. Muscle strength is normal.  REFLEXES: Reflexes are 2  and symmetric at the biceps, triceps, knees and ankles. Plantar responses are flexor.  SENSORY: Intact to light touch   COORDINATION: There is no trunk or limb ataxia.    GAIT/STANCE: Need push-up to get up from seated position, cautious unsteady   REVIEW OF SYSTEMS: Out of a complete 14 system review of symptoms, the patient complains only of the  following symptoms, seizures, anxiety, depression, anger outburst, and all other reviewed systems are negative.   ALLERGIES: No Known Allergies   HOME MEDICATIONS: Outpatient Medications Prior to Visit  Medication Sig Dispense Refill   FLUoxetine (PROZAC) 40 MG capsule Take 40 mg by mouth daily.     lamoTRIgine (LAMICTAL) 100 MG tablet Take 1 tablet (100 mg total) by mouth 2 (two) times daily. 180 tablet 4   levETIRAcetam (KEPPRA) 1000 MG tablet Take 2 tablets (2,000 mg total) by mouth 2 (two) times daily. 360 tablet 4   lisinopril (ZESTRIL) 10 MG tablet Take 10 mg by mouth daily.     lamoTRIgine (LAMICTAL) 25 MG tablet 1 tab bid x one week 2 tab bid x 2nd week 3 tab bid x 3rd week 84 tablet 0   phenytoin (DILANTIN) 100 MG ER capsule Take 3 capsules (300 mg total) by mouth at bedtime. 90 capsule 11   tamsulosin (FLOMAX) 0.4 MG CAPS capsule Take 0.4 mg by mouth daily.     No facility-administered medications prior to visit.     PAST MEDICAL HISTORY: Past Medical History:  Diagnosis Date   Anxiety    Back pain    low back pain/ compressed disk in lower back.    CKD (chronic kidney disease), stage II    Depression    Hypertension    Language barrier 03-01-13   Speaks very little English,primary-Vietnamese "[name] Dialect"   Seizures ([place])    due head trauma-seizures are less, but occurs from mild to more severe, which causes agitation to mental state.   TBI (traumatic brain injury)    CARE FOR BY BROTHER     PAST SURGICAL HISTORY: Past Surgical History:  Procedure Laterality Date   ABDOMINAL SURGERY     COLONOSCOPY N/A 03/17/2013   Procedure: COLONOSCOPY;  Surgeon: [name], MD;  [facility];  Service: Endoscopy;  Laterality: N/A;   NO PAST SURGERIES     ? one surgery in [place]-? what, has abdominal scar.     FAMILY HISTORY: Family History  Problem Relation Age of Onset   Seizures Mother        per his nonbiological brother     SOCIAL  HISTORY: Social History   Socioeconomic History   Marital status: Single    Spouse name: Not on file   Number of children: Not on file   Years of education: 4   Highest education level: Not on file  Occupational History    Employer: UNEMPLOYED  Tobacco Use   Smoking status: Former    Types: Cigarettes    Quit date: 03/02/1989    Years since quitting: 31.8   Smokeless tobacco: Never  Substance and Sexual Activity   Alcohol use: No   Drug use: No   Sexual activity: Not Currently  Other Topics Concern   Not on file  Social History Narrative   ** Merged History Encounter **    Patient is single.   Patient is disabled.   Patient has a 4th grade  education.   Patient drinks two cups of coffee daily.   Social Determinants of Health   Financial Resource Strain: Not on file  Food Insecurity: Not on file  Transportation Needs: Not on file  Physical Activity: Not on file  Stress: Not on file  Social Connections: Not on file  Intimate Partner Violence: Not on file     Marcial Pacas, M.D. Ph.D.  [facility] [place], [place] [address] Phone: [phone] Fax:      [phone]

## 2021-01-13 ENCOUNTER — Telehealth: Payer: Self-pay | Admitting: Neurology

## 2021-01-13 LAB — CBC WITH DIFFERENTIAL/PLATELET
Basophils Absolute: 0 10*3/uL (ref 0.0–0.2)
Basos: 1 %
EOS (ABSOLUTE): 0.1 10*3/uL (ref 0.0–0.4)
Eos: 1 %
Hematocrit: 43.4 % (ref 37.5–51.0)
Hemoglobin: 14 g/dL (ref 13.0–17.7)
Immature Grans (Abs): 0 10*3/uL (ref 0.0–0.1)
Immature Granulocytes: 0 %
Lymphocytes Absolute: 1.6 10*3/uL (ref 0.7–3.1)
Lymphs: 19 %
MCH: 29.7 pg (ref 26.6–33.0)
MCHC: 32.3 g/dL (ref 31.5–35.7)
MCV: 92 fL (ref 79–97)
Monocytes Absolute: 0.8 10*3/uL (ref 0.1–0.9)
Monocytes: 9 %
Neutrophils Absolute: 6 10*3/uL (ref 1.4–7.0)
Neutrophils: 70 %
Platelets: 330 10*3/uL (ref 150–450)
RBC: 4.72 x10E6/uL (ref 4.14–5.80)
RDW: 12.9 % (ref 11.6–15.4)
WBC: 8.5 10*3/uL (ref 3.4–10.8)

## 2021-01-13 LAB — COMPREHENSIVE METABOLIC PANEL
ALT: 15 IU/L (ref 0–44)
AST: 25 IU/L (ref 0–40)
Albumin/Globulin Ratio: 1.5 (ref 1.2–2.2)
Albumin: 4.9 g/dL — ABNORMAL HIGH (ref 3.8–4.8)
Alkaline Phosphatase: 90 IU/L (ref 44–121)
BUN/Creatinine Ratio: 9 — ABNORMAL LOW (ref 10–24)
BUN: 13 mg/dL (ref 8–27)
Bilirubin Total: 0.4 mg/dL (ref 0.0–1.2)
CO2: 27 mmol/L (ref 20–29)
Calcium: 9.6 mg/dL (ref 8.6–10.2)
Chloride: 98 mmol/L (ref 96–106)
Creatinine, Ser: 1.5 mg/dL — ABNORMAL HIGH (ref 0.76–1.27)
Globulin, Total: 3.2 g/dL (ref 1.5–4.5)
Glucose: 101 mg/dL — ABNORMAL HIGH (ref 70–99)
Potassium: 4 mmol/L (ref 3.5–5.2)
Sodium: 142 mmol/L (ref 134–144)
Total Protein: 8.1 g/dL (ref 6.0–8.5)
eGFR: 52 mL/min/{1.73_m2} — ABNORMAL LOW (ref >59)

## 2021-01-13 LAB — TSH: TSH: 1.31 u[IU]/mL (ref 0.450–4.500)

## 2021-01-13 LAB — LAMOTRIGINE LEVEL: Lamotrigine Lvl: 4.8 ug/mL (ref 2.0–20.0)

## 2021-01-13 LAB — VITAMIN D 25 HYDROXY (VIT D DEFICIENCY, FRACTURES): Vit D, 25-Hydroxy: 24.4 ng/mL — ABNORMAL LOW (ref 30.0–100.0)

## 2021-01-13 NOTE — Telephone Encounter (Signed)
Please call patient, laboratory evaluation showed  Mildly abnormal kidney function, which is worsened than baseline, he should increase water intake  Low vitamin D 24.4, he would benefit D3 supplement 1000 units daily  Rest of the laboratory evaluation showed no significant abnormality, continue taking antiepileptic medicine as discussed during office visit

## 2021-01-13 NOTE — Telephone Encounter (Signed)
The patient is from [place] but speaks his own language from another culture located within the country. I called him through the language line and was connect to the vietnamese interpreter who was able to reach his brother (on [place]). He understands vietnamese too. The results were provided to him and he verbalized understanding. He asked where to purchase the vitamin D supplement and was informed to purchase OTC at any pharmacy. He did not have any further questions.

## 2021-01-16 DIAGNOSIS — F439 Reaction to severe stress, unspecified: Secondary | ICD-10-CM | POA: Diagnosis not present

## 2021-01-16 DIAGNOSIS — F329 Major depressive disorder, single episode, unspecified: Secondary | ICD-10-CM | POA: Diagnosis not present

## 2021-01-27 ENCOUNTER — Other Ambulatory Visit: Payer: Self-pay | Admitting: Internal Medicine

## 2021-01-27 DIAGNOSIS — N400 Enlarged prostate without lower urinary tract symptoms: Secondary | ICD-10-CM | POA: Diagnosis not present

## 2021-01-27 DIAGNOSIS — I10 Essential (primary) hypertension: Secondary | ICD-10-CM | POA: Diagnosis not present

## 2021-01-27 DIAGNOSIS — K5900 Constipation, unspecified: Secondary | ICD-10-CM | POA: Diagnosis not present

## 2021-01-27 DIAGNOSIS — Z131 Encounter for screening for diabetes mellitus: Secondary | ICD-10-CM | POA: Diagnosis not present

## 2021-01-27 DIAGNOSIS — G40909 Epilepsy, unspecified, not intractable, without status epilepticus: Secondary | ICD-10-CM | POA: Diagnosis not present

## 2021-01-28 LAB — COMPLETE METABOLIC PANEL WITH GFR
AG Ratio: 1.3 (calc) (ref 1.0–2.5)
ALT: 14 U/L (ref 9–46)
AST: 19 U/L (ref 10–35)
Albumin: 4.4 g/dL (ref 3.6–5.1)
Alkaline phosphatase (APISO): 80 U/L (ref 35–144)
BUN/Creatinine Ratio: 12 (calc) (ref 6–22)
BUN: 18 mg/dL (ref 7–25)
CO2: 28 mmol/L (ref 20–32)
Calcium: 9.1 mg/dL (ref 8.6–10.3)
Chloride: 101 mmol/L (ref 98–110)
Creat: 1.55 mg/dL — ABNORMAL HIGH (ref 0.70–1.35)
Globulin: 3.3 g/dL (calc) (ref 1.9–3.7)
Glucose, Bld: 89 mg/dL (ref 65–99)
Potassium: 4.3 mmol/L (ref 3.5–5.3)
Sodium: 138 mmol/L (ref 135–146)
Total Bilirubin: 0.3 mg/dL (ref 0.2–1.2)
Total Protein: 7.7 g/dL (ref 6.1–8.1)
eGFR: 50 mL/min/{1.73_m2} — ABNORMAL LOW (ref >=60)

## 2021-01-28 LAB — CBC
HCT: 40.6 % (ref 38.5–50.0)
Hemoglobin: 13.5 g/dL (ref 13.2–17.1)
MCH: 30.8 pg (ref 27.0–33.0)
MCHC: 33.3 g/dL (ref 32.0–36.0)
MCV: 92.5 fL (ref 80.0–100.0)
MPV: 10.3 fL (ref 7.5–12.5)
Platelets: 359 10*3/uL (ref 140–400)
RBC: 4.39 10*6/uL (ref 4.20–5.80)
RDW: 12.5 % (ref 11.0–15.0)
WBC: 7.5 10*3/uL (ref 3.8–10.8)

## 2021-01-28 LAB — LIPID PANEL
Cholesterol: 205 mg/dL — ABNORMAL HIGH (ref <200)
HDL: 72 mg/dL (ref >=40)
LDL Cholesterol (Calc): 109 mg/dL (calc) — ABNORMAL HIGH
Non-HDL Cholesterol (Calc): 133 mg/dL (calc) — ABNORMAL HIGH (ref <130)
Total CHOL/HDL Ratio: 2.8 (calc) (ref <5.0)
Triglycerides: 128 mg/dL (ref <150)

## 2021-01-28 LAB — VITAMIN D 25 HYDROXY (VIT D DEFICIENCY, FRACTURES): Vit D, 25-Hydroxy: 25 ng/mL — ABNORMAL LOW (ref 30–100)

## 2021-01-28 LAB — TSH: TSH: 1.53 mIU/L (ref 0.40–4.50)

## 2021-01-28 LAB — PSA: PSA: 0.85 ng/mL (ref <=4.00)

## 2021-03-04 ENCOUNTER — Emergency Department (HOSPITAL_COMMUNITY): Payer: Medicare Other

## 2021-03-04 ENCOUNTER — Emergency Department (HOSPITAL_COMMUNITY)
Admission: EM | Admit: 2021-03-04 | Discharge: 2021-03-04 | Disposition: A | Discharge status: Home / Self Care | Payer: Medicare Other | Attending: Student | Admitting: Student

## 2021-03-04 ENCOUNTER — Other Ambulatory Visit: Payer: Self-pay

## 2021-03-04 DIAGNOSIS — Z79899 Other long term (current) drug therapy: Secondary | ICD-10-CM | POA: Insufficient documentation

## 2021-03-04 DIAGNOSIS — N182 Chronic kidney disease, stage 2 (mild): Secondary | ICD-10-CM | POA: Diagnosis not present

## 2021-03-04 DIAGNOSIS — I129 Hypertensive chronic kidney disease with stage 1 through stage 4 chronic kidney disease, or unspecified chronic kidney disease: Secondary | ICD-10-CM | POA: Diagnosis not present

## 2021-03-04 DIAGNOSIS — R569 Unspecified convulsions: Secondary | ICD-10-CM | POA: Insufficient documentation

## 2021-03-04 LAB — COMPREHENSIVE METABOLIC PANEL
ALT: 14 U/L (ref 0–44)
AST: 27 U/L (ref 15–41)
Albumin: 4.1 g/dL (ref 3.5–5.0)
Alkaline Phosphatase: 59 U/L (ref 38–126)
Anion gap: 9 (ref 5–15)
BUN: 16 mg/dL (ref 8–23)
CO2: 24 mmol/L (ref 22–32)
Calcium: 8.8 mg/dL — ABNORMAL LOW (ref 8.9–10.3)
Chloride: 99 mmol/L (ref 98–111)
Creatinine, Ser: 1.67 mg/dL — ABNORMAL HIGH (ref 0.61–1.24)
GFR, Estimated: 46 mL/min — ABNORMAL LOW (ref >60)
Glucose, Bld: 97 mg/dL (ref 70–99)
Potassium: 4.2 mmol/L (ref 3.5–5.1)
Sodium: 132 mmol/L — ABNORMAL LOW (ref 135–145)
Total Bilirubin: 0.7 mg/dL (ref 0.3–1.2)
Total Protein: 6.9 g/dL (ref 6.5–8.1)

## 2021-03-04 LAB — TSH: TSH: 1.468 u[IU]/mL (ref 0.350–4.500)

## 2021-03-04 MED ORDER — LEVETIRACETAM IN NACL 1500 MG/100ML IV SOLN
1500.0000 mg | Freq: Once | INTRAVENOUS | Status: AC
  Administered 2021-03-04: 1500 mg via INTRAVENOUS
  Filled 2021-03-04: qty 100

## 2021-03-04 MED ORDER — TAMSULOSIN HCL 0.4 MG PO CAPS
0.4000 mg | ORAL_CAPSULE | Freq: Once | ORAL | Status: AC
  Administered 2021-03-04: 0.4 mg via ORAL
  Filled 2021-03-04: qty 1

## 2021-03-04 NOTE — ED Notes (Signed)
Patient transported to CT 

## 2021-03-04 NOTE — ED Provider Notes (Signed)
[facility] EMERGENCY DEPARTMENT Provider Note   CSN: [id number] Arrival date & time: 03/04/21  1638     History  Chief Complaint  Patient presents with   Seizures    [name] is a 61 y.o. male with PMH TBI, seizure disorder, HTN, CKD 2 who presents emergency department for evaluation of a seizure.  Per EMS, patient was found on the side of the road seizing.  On EMS arrival, he had focal seizures of the left arm with associated nystagmus patient was given 2.5 mg Versed.  Patient arrives postictal and is unable to give further history.  I obtained additional history from the patient's brother who states that the patient has been compliant with his medication and has not been complaining of chest pain, shortness of breath, cough, fever or any other systemic symptoms prior to this happening today.   Seizures  Past Medical History:  Diagnosis Date   Anxiety    Back pain    low back pain/ compressed disk in lower back.    CKD (chronic kidney disease), stage II    Depression    Hypertension    Language barrier 03-01-13   Speaks very little English,primary-Vietnamese "[name] Dialect"   Seizures ([place])    due head trauma-seizures are less, but occurs from mild to more severe, which causes agitation to mental state.   TBI (traumatic brain injury)    CARE FOR BY BROTHER        Home Medications Prior to Admission medications   Medication Sig Start Date End Date Taking? Authorizing Provider  FLUoxetine (PROZAC) 40 MG capsule Take 40 mg by mouth daily.   Yes [provider]  lamoTRIgine (LAMICTAL) 100 MG tablet Take 2 tablets (200 mg total) by mouth 2 (two) times daily. 01/09/21  Yes Marcial Pacas, MD  levETIRAcetam (KEPPRA) 1000 MG tablet Take 2 tablets (2,000 mg total) by mouth 2 (two) times daily. 06/27/20  Yes Marcial Pacas, MD  lisinopril (ZESTRIL) 10 MG tablet Take 10 mg by mouth daily. 06/26/19  Yes [provider]  tamsulosin (FLOMAX) 0.4 MG CAPS  capsule Take 0.4 mg by mouth.   Yes [provider]      Allergies    Patient has no known allergies.    Review of Systems   Review of Systems  Neurological:  Positive for seizures.   Physical Exam Updated Vital Signs BP 136/78 (BP Location: Right Arm)    Pulse 98    Temp 98.4 F (36.9 C) (Oral)    Resp 15    Ht 5\' 7"  (1.702 m)    Wt 80.1 kg    SpO2 100%    BMI 27.66 kg/m  Physical Exam Vitals and nursing note reviewed.  Constitutional:      General: He is not in acute distress.    Appearance: He is well-developed.  HENT:     Head: Normocephalic and atraumatic.  Eyes:     Conjunctiva/sclera: Conjunctivae normal.  Cardiovascular:     Rate and Rhythm: Normal rate and regular rhythm.     Heart sounds: No murmur heard. Pulmonary:     Effort: Pulmonary effort is normal. No respiratory distress.     Breath sounds: Normal breath sounds.  Abdominal:     Palpations: Abdomen is soft.     Tenderness: There is no abdominal tenderness.  Musculoskeletal:        General: No swelling.     Cervical back: Neck supple.  Skin:  General: Skin is warm and dry.     Capillary Refill: Capillary refill takes less than 2 seconds.  Neurological:     Mental Status: He is alert.     Cranial Nerves: No cranial nerve deficit.     Sensory: No sensory deficit.     Motor: No weakness.  Psychiatric:        Mood and Affect: Mood normal.    ED Results / Procedures / Treatments   Labs (all labs ordered are listed, but only abnormal results are displayed) Labs Reviewed  COMPREHENSIVE METABOLIC PANEL - Abnormal; Notable for the following components:      Result Value   Sodium 132 (*)    Creatinine, Ser 1.67 (*)    Calcium 8.8 (*)    GFR, Estimated 46 (*)    All other components within normal limits  TSH  CBC WITH DIFFERENTIAL/PLATELET  CBC WITH DIFFERENTIAL/PLATELET    EKG None  Radiology CT Head Wo Contrast  Result Date: 03/04/2021 CLINICAL DATA:  Seizure disorder EXAM: CT  HEAD WITHOUT CONTRAST TECHNIQUE: Contiguous axial images were obtained from the base of the skull through the vertex without intravenous contrast. COMPARISON:  CT head 07/02/2019 FINDINGS: Brain: Mild cerebral atrophy.  Mild to moderate cerebellar atrophy. Hypodensity left frontal white matter unchanged. Small hypodensities in the basal ganglia bilaterally consistent with chronic infarct. No acute infarct, hemorrhage, mass Vascular: Negative for hyperdense vessel Skull: Negative Sinuses/Orbits: Mucosal edema paranasal sinuses.  Negative orbit Other: None IMPRESSION: No acute abnormality. Mild chronic ischemic change. Cerebellar atrophy. Electronically Signed   By: Franchot Gallo M.D.   On: 03/04/2021 18:22   DG Chest Portable 1 View  Result Date: 03/04/2021 CLINICAL DATA:  Seizure. EXAM: PORTABLE CHEST 1 VIEW COMPARISON:  September 30, 2018. FINDINGS: The heart size and mediastinal contours are within normal limits. Both lungs are clear. The visualized skeletal structures are unremarkable. IMPRESSION: No active disease. Electronically Signed   By: Marijo Conception M.D.   On: 03/04/2021 20:19    Procedures Procedures   I reviewed the cardiac monitor, no ST elevations, improving sinus tachycardia with no arrhythmia.  On discharge, patient heart rate normalized  Medications Ordered in ED Medications  levETIRAcetam (KEPPRA) IVPB 1500 mg/ 100 mL premix (0 mg Intravenous Stopped 03/04/21 2132)  tamsulosin (FLOMAX) capsule 0.4 mg (0.4 mg Oral Given 03/04/21 2118)    ED Course/ Medical Decision Making/ A&P                           Medical Decision Making  Patient seen the emergency department for evaluation of a seizure.  Physical exam is unremarkable with the patient that his postictal but otherwise has a normal neurologic exam with no focal motor or sensory deficits.  No persistent seizure activity.  Chemistry here with a creatinine elevation to 1.67 but is otherwise unremarkable outside of a mild hyponatremia  132.  TSH normal.  CT head unremarkable.  Chest x-ray unremarkable.  I evaluated these studies personally and this was confirmed by radiology.  I spoke directly with neurology who states that per chart review, the patient is likely going to have at least 1 seizure a month as he is currently maxed out on his lamotrigine and Keppra.  The patient was loaded here in the emergency department and per neurology, patient is safe for discharge with outpatient neurologic follow-up.  On reevaluation, patient has returned to mental status baseline and he was discharged with  neurologic follow-up        Final Clinical Impression(s) / ED Diagnoses Final diagnoses:  Seizure [facility])    Rx / DC Orders ED Discharge Orders     None         Evona Westra, Debe Coder, MD 03/04/21 2314

## 2021-03-04 NOTE — ED Notes (Signed)
Pt is still disoriented, he is able to be redirected. Pt was found walking around room and putting on his coat. Pt was able to be put back in his bed and began to rest

## 2021-03-04 NOTE — ED Triage Notes (Signed)
Pt BIB EMS after found on side of the road on the ground seizing. Unsure if the fall was result of seizure. When EMS arrived pt continued focal seizures in his left arm and nystagmus. Original GCS was 8 after 2.5mg  of versed admin GCS is now 12. Pt speaks another language and is unable to tell me what language. Pt is stable at this time

## 2021-05-07 NOTE — Congregational Nurse Program (Signed)
Home visit to recheck BP following health screening clinic at the [facility] on 04/19/2021. BP today is 160/88. Patient was told to stop Lisinopril 10 mg on 03/19/2021 and missed a follow-up appointment with his PCP at [place] on 04/18/2021. CN called to reschedule appointment but had to leave message. Will call back tomorrow. Jake Michaelis RN, Congregational Nurse [phone]. ?

## 2021-06-28 ENCOUNTER — Other Ambulatory Visit: Payer: Self-pay | Admitting: Neurology

## 2021-07-16 NOTE — Progress Notes (Signed)
Patient: [name] Date of Birth: 05/04/1960  Reason for Visit: Follow up for seizures History from: Patient, brother, interpreter  Primary Neurologist: Dr.Yan  ASSESSMENT AND PLAN 61 y.o. year old male   30.  Seizure disorder 2.  History of TBI 3.  Memory loss with agitation 4.  PTSD  -Currently taking Keppra 2000 mg twice daily, Lamictal 200 mg twice daily, continues with 1-2 seizures a month  -Check labs today, plan to increase Lamictal to 250 mg twice daily if labs allow, has seen good benefit in less spells with higher dose Lamictal, last seizure was in March 2023 -Return back in 6 months, document spells   HISTORY  [name] is a 61 y.o. male was seen initially on June 27 2020   Past medical history of traumatic brain injury, was involved in [place], [place] before he immigrated to [place] in 1986, initially he was able to work at [employer], had steady decline in functional status, suffered PTSD, some angry outbursts, gait abnormality, recurrent seizures, currently on disability   For PTSD he is taking Prozac 40 mg daily Seizure, he is on Keppra 1000 mg 1-1/2 tablets twice a day, Dilantin 100 mg 3 tablets every night, occasionally out burst, but overall improved,   Dilantin level was 8.6 in June 2021,   He lives with his roommates and his brother, who take care of him, who make sure he takes his medicine regularly   He continues to have recurrent seizures at least once a month, had a 3 seizure on June 26, 2020, about 2 hours apart, was awake in between, he has no warning signs before he went into tonic-clonic seizure   UPDATE Jan 09 2021: He continues to have seizure 1 to twice each month, minor seizure, often recovered well, tolerating lamotrigine 100 mg twice a day, and higher dose Keppra 1000 mg 2 tablets twice a day, weaned off Dilantin successfully, mood seems to better with lamotrigine  Update Jul 17, 2021 SS: In the ER 03/31/2021, found  on the side of the road having a seizure, reportedly having focal seizures of the left arm on arrival, given Versed.  His brother denied any missed doses of medicine.  No adjustments were made.  CT head showed no acute abnormality.  Labs showed TSH 1.468, sodium was 132, creatinine 1.68.  Seizure occurred on Keppra 2000 mg twice daily, Lamictal 200 mg twice daily.  Office laboratory evaluation January 09, 2021 showed low vitamin D24.4, increased creatinine 1.50, Lamictal level 4.8, TSH 1.310. Lamictal was increased from 200 mg daily to 200 mg twice daily.   Here with brother, interpreter, last seizures were in march, there were 2, sitting at table, tensed up, closed eyes, lasted 2-3 minutes, no tongue injury or oral incontinence. Afterwards confused, tired. Higher dose Lamictal resulted in less spells.    REVIEW OF SYSTEMS: Out of a complete 14 system review of symptoms, the patient complains only of the following symptoms, and all other reviewed systems are negative.  See HPI  ALLERGIES: No Known Allergies  HOME MEDICATIONS: Outpatient Medications Prior to Visit  Medication Sig Dispense Refill   FLUoxetine (PROZAC) 40 MG capsule Take 40 mg by mouth daily.     lamoTRIgine (LAMICTAL) 100 MG tablet Take 2 tablets (200 mg total) by mouth 2 (two) times daily. 360 tablet 4   tamsulosin (FLOMAX) 0.4 MG CAPS capsule Take 0.4 mg by mouth.     levETIRAcetam (KEPPRA) 1000 MG  tablet Take 2 tablets by mouth twice daily 360 tablet 0   lisinopril (ZESTRIL) 10 MG tablet Take 10 mg by mouth daily. (Patient not taking: Reported on 07/17/2021)     No facility-administered medications prior to visit.    PAST MEDICAL HISTORY: Past Medical History:  Diagnosis Date   Anxiety    Back pain    low back pain/ compressed disk in lower back.    CKD (chronic kidney disease), stage II    Depression    Hypertension    Language barrier 03-01-13   Speaks very little English,primary-Vietnamese "[name] Dialect"    Seizures ([place])    due head trauma-seizures are less, but occurs from mild to more severe, which causes agitation to mental state.   TBI (traumatic brain injury) [facility])    CARE FOR BY BROTHER    PAST SURGICAL HISTORY: Past Surgical History:  Procedure Laterality Date   ABDOMINAL SURGERY     COLONOSCOPY N/A 03/17/2013   Procedure: COLONOSCOPY;  Surgeon: [name], MD;  Location: [facility] ENDOSCOPY;  Service: Endoscopy;  Laterality: N/A;   NO PAST SURGERIES     ? one surgery in [place]-? what, has abdominal scar.    FAMILY HISTORY: Family History  Problem Relation Age of Onset   Seizures Mother        per his nonbiological brother    SOCIAL HISTORY: Social History   Socioeconomic History   Marital status: Single    Spouse name: Not on file   Number of children: Not on file   Years of education: 4   Highest education level: Not on file  Occupational History    Employer: UNEMPLOYED  Tobacco Use   Smoking status: Former    Types: Cigarettes    Quit date: 03/02/1989    Years since quitting: 32.3   Smokeless tobacco: Never  Substance and Sexual Activity   Alcohol use: No   Drug use: No   Sexual activity: Not Currently  Other Topics Concern   Not on file  Social History Narrative   Brother and a roommate live with him       ** Merged History Encounter **    Patient is single.   Patient is disabled.   Patient has a 4th grade education.   Patient drinks two cups of coffee daily.   Social Determinants of Health   Financial Resource Strain: Not on file  Food Insecurity: Not on file  Transportation Needs: Not on file  Physical Activity: Not on file  Stress: Not on file  Social Connections: Not on file  Intimate Partner Violence: Not on file    PHYSICAL EXAM  Vitals:   07/17/21 1509  BP: (!) 142/82  Pulse: 74  Weight: 181 lb (82.1 kg)  Height: '5\' 5"'$  (1.651 m)   Body mass index is 30.12 kg/m.  Generalized: Well developed, in no acute distress  Neurological  examination  Mentation: Alert, cooperative, history is provided by his brother and translator, follows exam commands fairly well Cranial nerve II-XII: Pupils were equal round reactive to light. Left lateral deviation of left eye. Facial sensation and strength were normal. Head turning and shoulder shrug  were normal and symmetric. Motor: The motor testing reveals 5 over 5 strength of all 4 extremities. Good symmetric motor tone is noted throughout.  Sensory: Sensory testing is intact to soft touch on all 4 extremities. No evidence of extinction is noted.  Coordination: Cerebellar testing reveals good finger-nose-finger and heel-to-shin bilaterally, mild difficulty with  exam commands Gait and station: Gait is slightly wide-based, but cautious and independent Reflexes: Deep tendon reflexes are symmetric and normal bilaterally.   DIAGNOSTIC DATA (LABS, IMAGING, TESTING) - I reviewed patient records, labs, notes, testing and imaging myself where available.  Lab Results  Component Value Date   WBC 7.5 01/27/2021   HGB 13.5 01/27/2021   HCT 40.6 01/27/2021   MCV 92.5 01/27/2021   PLT 359 01/27/2021      Component Value Date/Time   NA 132 (L) 03/04/2021 1737   NA 142 01/09/2021 1555   K 4.2 03/04/2021 1737   CL 99 03/04/2021 1737   CO2 24 03/04/2021 1737   GLUCOSE 97 03/04/2021 1737   BUN 16 03/04/2021 1737   BUN 13 01/09/2021 1555   CREATININE 1.67 (H) 03/04/2021 1737   CREATININE 1.55 (H) 01/27/2021 0000   CALCIUM 8.8 (L) 03/04/2021 1737   PROT 6.9 03/04/2021 1737   PROT 8.1 01/09/2021 1555   ALBUMIN 4.1 03/04/2021 1737   ALBUMIN 4.9 (H) 01/09/2021 1555   AST 27 03/04/2021 1737   ALT 14 03/04/2021 1737   ALKPHOS 59 03/04/2021 1737   BILITOT 0.7 03/04/2021 1737   BILITOT 0.4 01/09/2021 1555   GFRNONAA 46 (L) 03/04/2021 1737   GFRAA >60 07/03/2019 0422   Lab Results  Component Value Date   CHOL 205 (H) 01/27/2021   HDL 72 01/27/2021   LDLCALC 109 (H) 01/27/2021   TRIG 128  01/27/2021   CHOLHDL 2.8 01/27/2021   Lab Results  Component Value Date   HGBA1C 5.8 (H) 03/27/2016   Lab Results  Component Value Date   VITAMINB12 186 03/27/2016   Lab Results  Component Value Date   TSH 1.468 03/04/2021    Butler Denmark, AGNP-C, DNP 07/17/2021, 3:22 PM [facility] [address] [place], [place] [address] [phone]

## 2021-07-17 ENCOUNTER — Encounter: Payer: Self-pay | Admitting: Neurology

## 2021-07-17 ENCOUNTER — Ambulatory Visit (INDEPENDENT_AMBULATORY_CARE_PROVIDER_SITE_OTHER): Payer: Medicare Other | Admitting: Neurology

## 2021-07-17 VITALS — BP 142/82 | HR 74 | Ht 65.0 in | Wt 181.0 lb

## 2021-07-17 DIAGNOSIS — G40909 Epilepsy, unspecified, not intractable, without status epilepticus: Secondary | ICD-10-CM | POA: Diagnosis not present

## 2021-07-17 MED ORDER — LEVETIRACETAM 1000 MG PO TABS: 2000.0000 mg | ORAL_TABLET | Freq: Two times a day (BID) | ORAL | 4 refills | Status: DC

## 2021-07-17 NOTE — Patient Instructions (Signed)
Check labs today, once return will plan to increase Lamictal, will call with instructions with once, labs result, for now continue current doses

## 2021-07-18 LAB — COMPREHENSIVE METABOLIC PANEL
ALT: 16 IU/L (ref 0–44)
AST: 18 IU/L (ref 0–40)
Albumin/Globulin Ratio: 1.5 (ref 1.2–2.2)
Albumin: 4.6 g/dL (ref 3.8–4.8)
Alkaline Phosphatase: 99 IU/L (ref 44–121)
BUN/Creatinine Ratio: 12 (ref 10–24)
BUN: 16 mg/dL (ref 8–27)
Bilirubin Total: 0.2 mg/dL (ref 0.0–1.2)
CO2: 28 mmol/L (ref 20–29)
Calcium: 9.4 mg/dL (ref 8.6–10.2)
Chloride: 102 mmol/L (ref 96–106)
Creatinine, Ser: 1.38 mg/dL — ABNORMAL HIGH (ref 0.76–1.27)
Globulin, Total: 3 g/dL (ref 1.5–4.5)
Glucose: 103 mg/dL — ABNORMAL HIGH (ref 70–99)
Potassium: 3.6 mmol/L (ref 3.5–5.2)
Sodium: 143 mmol/L (ref 134–144)
Total Protein: 7.6 g/dL (ref 6.0–8.5)
eGFR: 57 mL/min/{1.73_m2} — ABNORMAL LOW (ref >59)

## 2021-07-18 LAB — CBC WITH DIFFERENTIAL/PLATELET
Basophils Absolute: 0 10*3/uL (ref 0.0–0.2)
Basos: 1 %
EOS (ABSOLUTE): 0.4 10*3/uL (ref 0.0–0.4)
Eos: 5 %
Hematocrit: 43.3 % (ref 37.5–51.0)
Hemoglobin: 14.7 g/dL (ref 13.0–17.7)
Immature Grans (Abs): 0 10*3/uL (ref 0.0–0.1)
Immature Granulocytes: 0 %
Lymphocytes Absolute: 1.4 10*3/uL (ref 0.7–3.1)
Lymphs: 19 %
MCH: 30 pg (ref 26.6–33.0)
MCHC: 33.9 g/dL (ref 31.5–35.7)
MCV: 88 fL (ref 79–97)
Monocytes Absolute: 0.8 10*3/uL (ref 0.1–0.9)
Monocytes: 11 %
Neutrophils Absolute: 4.7 10*3/uL (ref 1.4–7.0)
Neutrophils: 64 %
Platelets: 379 10*3/uL (ref 150–450)
RBC: 4.9 x10E6/uL (ref 4.14–5.80)
RDW: 13 % (ref 11.6–15.4)
WBC: 7.4 10*3/uL (ref 3.4–10.8)

## 2021-07-18 LAB — LAMOTRIGINE LEVEL: Lamotrigine Lvl: 4.6 ug/mL (ref 2.0–20.0)

## 2021-07-21 ENCOUNTER — Telehealth: Payer: Self-pay | Admitting: Neurology

## 2021-07-21 MED ORDER — LAMOTRIGINE 100 MG PO TABS
250.0000 mg | ORAL_TABLET | Freq: Two times a day (BID) | ORAL | 4 refills | Status: DC
Start: 2021-07-21 — End: 2022-01-29

## 2021-07-21 NOTE — Telephone Encounter (Signed)
Please call the patient, Lamictal level was 4.6, creatinine stable, improved at 1.38, currently taking Lamictal 200 mg twice a day.  We will increase Lamictal to 250 mg twice a day, with the 100 mg tablets I would start by taking 2.5 tablets in the AM for 1 week, then take 2.5 tablets twice daily thereafter.  Meds ordered this encounter  Medications   lamoTRIgine (LAMICTAL) 100 MG tablet    Sig: Take 2.5 tablets (250 mg total) by mouth 2 (two) times daily.    Dispense:  450 tablet    Refill:  4    This reflects new dosing

## 2021-07-22 NOTE — Telephone Encounter (Signed)
The patient speaks Physiological scientist. The language line did not have an interpreter available who can speak it.   I have talked to his brother in the past ([name] on [place]). He understands some english. He also understands vietnamese (which we can get an interpreter for through the language line).  I called him today. We slowly went through the message below with the lab results and medication increase instructions. He verbalized understanding and correctly repeated the plan back to me. He did not feel any additional explanation was necessary with an interpreter. Says he already received a call from [facility] about the new prescription of lamotrigine.

## 2021-12-27 ENCOUNTER — Ambulatory Visit: Payer: Medicare Other

## 2021-12-27 DIAGNOSIS — Z23 Encounter for immunization: Secondary | ICD-10-CM

## 2022-01-12 ENCOUNTER — Other Ambulatory Visit: Payer: Self-pay | Admitting: Internal Medicine

## 2022-01-13 LAB — LIPID PANEL
Cholesterol: 188 mg/dL (ref <200)
HDL: 66 mg/dL (ref >=40)
LDL Cholesterol (Calc): 95 mg/dL (calc)
Non-HDL Cholesterol (Calc): 122 mg/dL (calc) (ref <130)
Total CHOL/HDL Ratio: 2.8 (calc) (ref <5.0)
Triglycerides: 169 mg/dL — ABNORMAL HIGH (ref <150)

## 2022-01-13 LAB — COMPLETE METABOLIC PANEL WITH GFR
AG Ratio: 1.4 (calc) (ref 1.0–2.5)
ALT: 15 U/L (ref 9–46)
AST: 15 U/L (ref 10–35)
Albumin: 4.1 g/dL (ref 3.6–5.1)
Alkaline phosphatase (APISO): 83 U/L (ref 35–144)
BUN/Creatinine Ratio: 11 (calc) (ref 6–22)
BUN: 16 mg/dL (ref 7–25)
CO2: 25 mmol/L (ref 20–32)
Calcium: 9.6 mg/dL (ref 8.6–10.3)
Chloride: 105 mmol/L (ref 98–110)
Creat: 1.45 mg/dL — ABNORMAL HIGH (ref 0.70–1.35)
Globulin: 3 g/dL (calc) (ref 1.9–3.7)
Glucose, Bld: 105 mg/dL — ABNORMAL HIGH (ref 65–99)
Potassium: 3.9 mmol/L (ref 3.5–5.3)
Sodium: 142 mmol/L (ref 135–146)
Total Bilirubin: 0.3 mg/dL (ref 0.2–1.2)
Total Protein: 7.1 g/dL (ref 6.1–8.1)
eGFR: 54 mL/min/{1.73_m2} — ABNORMAL LOW (ref >=60)

## 2022-01-13 LAB — CBC
HCT: 39.8 % (ref 38.5–50.0)
Hemoglobin: 13.8 g/dL (ref 13.2–17.1)
MCH: 30.5 pg (ref 27.0–33.0)
MCHC: 34.7 g/dL (ref 32.0–36.0)
MCV: 88.1 fL (ref 80.0–100.0)
MPV: 10.6 fL (ref 7.5–12.5)
Platelets: 330 10*3/uL (ref 140–400)
RBC: 4.52 10*6/uL (ref 4.20–5.80)
RDW: 12.9 % (ref 11.0–15.0)
WBC: 6.3 10*3/uL (ref 3.8–10.8)

## 2022-01-13 LAB — TSH: TSH: 1.51 mIU/L (ref 0.40–4.50)

## 2022-01-13 LAB — PSA: PSA: 0.94 ng/mL (ref <=4.00)

## 2022-01-13 LAB — VITAMIN D 25 HYDROXY (VIT D DEFICIENCY, FRACTURES): Vit D, 25-Hydroxy: 21 ng/mL — ABNORMAL LOW (ref 30–100)

## 2022-01-29 ENCOUNTER — Ambulatory Visit (INDEPENDENT_AMBULATORY_CARE_PROVIDER_SITE_OTHER): Payer: Medicare Other | Admitting: Neurology

## 2022-01-29 ENCOUNTER — Encounter: Payer: Self-pay | Admitting: Neurology

## 2022-01-29 VITALS — BP 143/79 | HR 74 | Ht 65.0 in | Wt 182.5 lb

## 2022-01-29 DIAGNOSIS — G40909 Epilepsy, unspecified, not intractable, without status epilepticus: Secondary | ICD-10-CM | POA: Diagnosis not present

## 2022-01-29 DIAGNOSIS — S069X9S Unspecified intracranial injury with loss of consciousness of unspecified duration, sequela: Secondary | ICD-10-CM

## 2022-01-29 DIAGNOSIS — G40209 Localization-related (focal) (partial) symptomatic epilepsy and epileptic syndromes with complex partial seizures, not intractable, without status epilepticus: Secondary | ICD-10-CM | POA: Diagnosis not present

## 2022-01-29 MED ORDER — LAMOTRIGINE 100 MG PO TABS: 250.0000 mg | ORAL_TABLET | Freq: Two times a day (BID) | ORAL | 4 refills | Status: DC

## 2022-01-29 MED ORDER — LAMOTRIGINE 100 MG PO TABS
300.0000 mg | ORAL_TABLET | Freq: Two times a day (BID) | ORAL | 4 refills | Status: DC
Start: 2022-01-29 — End: 2022-01-29

## 2022-01-29 NOTE — Patient Instructions (Signed)
     FLUoxetine (PROZAC) 40 MG capsule 40 mg, Daily  For depression      lamoTRIgine (LAMICTAL) 100 MG tablet 250 mg, 2 times daily 2.5 tab twice a day For seizure      levETIRAcetam (KEPPRA) 1000 MG tablet 2,000 mg, 2 times daily  2 tabs twice a day For seizure        tamsulosin (FLOMAX) 0.4 MG CAPS capsule 0.4 mg For prostate

## 2022-01-29 NOTE — Progress Notes (Signed)
ASSESSMENT AND PLAN 61 y.o. year old male   55.  Seizure disorder 2.  History of TBI 3.  Memory loss with agitation 4.  PTSD  Supposed to be on Keppra 1000 mg 2 tablets twice a day, he has no abnormal kidney function, creatinine 1.45, GFR of 54, will not push up keppra more, he tolerated current medication well  lamotrigine 100 mg 2 and half tablets twice a day, but family can only give him lamotrigine 200 mg twice a day  Brother reported he has small spells, once or twice every 2 weeks, will increase lamotrigine to intended to 50 mg twice a day  We went over medications in detail with him,  DIAGNOSTIC DATA (LABS, IMAGING, TESTING) - I reviewed patient records, labs, notes, testing and imaging myself where available.   Labs in Nov 2023, normal CBC, TSH, VitD 21, CMP, creat 1.45, LDL 95,  HISTORY  [name] is a 61 y.o. male was seen initially on June 27 2020   Past medical history of traumatic brain injury, was involved in [place], [place] before he immigrated to [place] in 1986, initially he was able to work at [employer], had steady decline in functional status, suffered PTSD, some angry outbursts, gait abnormality, recurrent seizures, currently on disability   For PTSD he is taking Prozac 40 mg daily Seizure, he is on Keppra 1000 mg 1-1/2 tablets twice a day, Dilantin 100 mg 3 tablets every night, occasionally out burst, but overall improved,   Dilantin level was 8.6 in June 2021,   He lives with his roommates and his brother, who take care of him, who make sure he takes his medicine regularly   He continues to have recurrent seizures at least once a month, had a 3 seizure on June 26, 2020, about 2 hours apart, was awake in between, he has no warning signs before he went into tonic-clonic seizure   UPDATE Jan 09 2021: He continues to have seizure 1 to twice each month, minor seizure, often recovered well, tolerating lamotrigine 100 mg twice a day, and  higher dose Keppra 1000 mg 2 tablets twice a day, weaned off Dilantin successfully, mood seems to better with lamotrigine  UPDATE Jan 29 2022: He is accompanied by his brother and interpreter at today's visit, emergency room visit in January 2023, for recurrent seizure, upon arrival, patient has left arm seizure with associated nystagmus, lamotrigine level was 4.6, in 2022 was 4.8, taking lamotrigine 100 mg 2 tablets twice a day,  CT head without contrast showed no acute abnormality, cerebellar atrophy  Laboratory evaluations, May 2023, lamotrigine 4.6, normal TSH, CBC, CMP with creatinine 1.45  He is supposed to be on higher dose of lamotrigine 100 mg 2 and half tablets twice a day, however the brother was confused, did not do so, continued on Keppra 1000 mg 2 tablets twice a day  He tolerated his medication well, no agitations,  REVIEW OF SYSTEMS: Out of a complete 14 system review of symptoms, the patient complains only of the following symptoms, and all other reviewed systems are negative.  See HPI  PHYSICAL EXAM  Vitals:   01/29/22 1515  BP: (!) 143/79  Pulse: 74  Weight: 182 lb 8 oz (82.8 kg)  Height: '5\' 5"'$  (1.651 m)   Body mass index is 30.37 kg/m.  PHYSICAL EXAMNIATION:  Gen: NAD, conversant, well nourised, well groomed  Cardiovascular: Regular rate rhythm, no peripheral edema, warm, nontender. Eyes: Conjunctivae clear without exudates or hemorrhage Neck: Supple, no carotid bruits. Pulmonary: Clear to auscultation bilaterally   NEUROLOGICAL EXAM:  MENTAL STATUS: Speech/cognition: rely on his interpreter for history,  CRANIAL NERVES: CN II: Pupils reactive to light CN III, IV, VI: Conjugate eye movement CN V: Facial sensation is intact to pinprick in all 3 divisions bilaterally. Corneal responses are intact.  CN VII: Face is symmetric with normal eye closure and smile. CN VIII: Hearing is normal to casual conversation CN IX, X: Palate elevates  symmetrically. Phonation is normal. CN XI: Head turning and shoulder shrug are intact CN XII: Tongue is midline with normal movements and no atrophy.  MOTOR: Moving 4 extremities without difficulty REFLEXES: Hypoactive and symmetric SENSORY: Intact to light touch,    COORDINATION: No truncal ataxia or dysmetria of  GAIT/STANCE: Need push-up to get up from seated position, unsteady    ALLERGIES: No Known Allergies  HOME MEDICATIONS: Outpatient Medications Prior to Visit  Medication Sig Dispense Refill   ergocalciferol (VITAMIN D2) 1.25 MG (50000 UT) capsule Take 50,000 Units by mouth once a week.     FLUoxetine (PROZAC) 40 MG capsule Take 40 mg by mouth daily.     lamoTRIgine (LAMICTAL) 100 MG tablet Take 2.5 tablets (250 mg total) by mouth 2 (two) times daily. 450 tablet 4   levETIRAcetam (KEPPRA) 1000 MG tablet Take 2 tablets (2,000 mg total) by mouth 2 (two) times daily. 360 tablet 4   lisinopril (ZESTRIL) 10 MG tablet Take 10 mg by mouth daily.     tamsulosin (FLOMAX) 0.4 MG CAPS capsule Take 0.4 mg by mouth.     No facility-administered medications prior to visit.    PAST MEDICAL HISTORY: Past Medical History:  Diagnosis Date   Anxiety    Back pain    low back pain/ compressed disk in lower back.    CKD (chronic kidney disease), stage II    Depression    Hypertension    Language barrier 03-01-13   Speaks very little English,primary-Vietnamese "[name] Dialect"   Seizures ([place])    due head trauma-seizures are less, but occurs from mild to more severe, which causes agitation to mental state.   TBI (traumatic brain injury) [facility])    CARE FOR BY BROTHER    PAST SURGICAL HISTORY: Past Surgical History:  Procedure Laterality Date   ABDOMINAL SURGERY     COLONOSCOPY N/A 03/17/2013   Procedure: COLONOSCOPY;  Surgeon: [name], MD;  Location: [facility];  Service: Endoscopy;  Laterality: N/A;   NO PAST SURGERIES     ? one surgery in [place]-? what, has abdominal  scar.    FAMILY HISTORY: Family History  Problem Relation Age of Onset   Seizures Mother        per his nonbiological brother    SOCIAL HISTORY: Social History   Socioeconomic History   Marital status: Single    Spouse name: Not on file   Number of children: Not on file   Years of education: 4   Highest education level: Not on file  Occupational History    Employer: UNEMPLOYED  Tobacco Use   Smoking status: Former    Types: Cigarettes    Quit date: 03/02/1989    Years since quitting: 32.9   Smokeless tobacco: Never  Substance and Sexual Activity   Alcohol use: No   Drug use: No   Sexual activity: Not Currently  Other Topics Concern  Not on file  Social History Narrative   Brother and a roommate live with him       ** Merged History Encounter **    Patient is single.   Patient is disabled.   Patient has a 4th grade education.   Patient drinks two cups of coffee daily.   Social Determinants of Health   Financial Resource Strain: Not on file  Food Insecurity: Not on file  Transportation Needs: Not on file  Physical Activity: Not on file  Stress: Not on file  Social Connections: Not on file  Intimate Partner Violence: Not on file      Marcial Pacas, M.D. Ph.D.  [facility] [place], [place] [address] Phone: [phone] Fax:      [phone]

## 2022-08-08 ENCOUNTER — Telehealth: Payer: Self-pay | Admitting: Neurology

## 2022-08-26 ENCOUNTER — Ambulatory Visit (INDEPENDENT_AMBULATORY_CARE_PROVIDER_SITE_OTHER): Payer: Medicare Other | Admitting: Neurology

## 2022-08-26 ENCOUNTER — Encounter: Payer: Self-pay | Admitting: Neurology

## 2022-08-26 VITALS — BP 128/71 | HR 76 | Wt 177.5 lb

## 2022-08-26 DIAGNOSIS — G40909 Epilepsy, unspecified, not intractable, without status epilepticus: Secondary | ICD-10-CM | POA: Diagnosis not present

## 2022-08-26 DIAGNOSIS — S069X9S Unspecified intracranial injury with loss of consciousness of unspecified duration, sequela: Secondary | ICD-10-CM

## 2022-08-26 MED ORDER — LAMOTRIGINE 100 MG PO TABS
300.0000 mg | ORAL_TABLET | Freq: Two times a day (BID) | ORAL | 3 refills | Status: DC
Start: 2022-08-26 — End: 2023-09-08

## 2022-08-26 MED ORDER — LEVETIRACETAM 1000 MG PO TABS: 2000.0000 mg | ORAL_TABLET | Freq: Two times a day (BID) | ORAL | 3 refills | Status: DC

## 2022-08-26 NOTE — Patient Instructions (Addendum)
Increase Lamictal 300 mg twice daily, continue Keppra 2000 mg twice daily. I will check labs today. Follow up in 6 months  Meds ordered this encounter  Medications   lamoTRIgine (LAMICTAL) 100 MG tablet    Sig: Take 3 tablets (300 mg total) by mouth 2 (two) times daily.    Dispense:  540 tablet    Refill:  3    This reflects new dosing   levETIRAcetam (KEPPRA) 1000 MG tablet    Sig: Take 2 tablets (2,000 mg total) by mouth 2 (two) times daily.    Dispense:  360 tablet    Refill:  3

## 2022-08-26 NOTE — Progress Notes (Addendum)
ASSESSMENT AND PLAN 62 y.o. year old male   1.  Seizure disorder 2.  History of TBI 3.  Memory loss with agitation 4.  PTSD  -Continues with weekly generalized seizure events on Keppra 1000 mg, 2 tablets twice daily, Lamictal 250 mg twice daily -Will increase Lamictal 300 mg twice daily, continue Keppra -Check routine labs today including drug levels, need to reevaluate kidney function, will have to watch dose of Keppra -On prescription vitamin D -Seizure frequency has been consistent since TBI in [place] War despite medication intervention  -EEG in 2020 showed potential epileptogenicity in left frontotemporal region and cortical dysfunction -Follow-up in 6 months with Dr. Terrace Arabia  Meds ordered this encounter  Medications   lamoTRIgine (LAMICTAL) 100 MG tablet    Sig: Take 3 tablets (300 mg total) by mouth 2 (two) times daily.    Dispense:  540 tablet    Refill:  3    This reflects new dosing   levETIRAcetam (KEPPRA) 1000 MG tablet    Sig: Take 2 tablets (2,000 mg total) by mouth 2 (two) times daily.    Dispense:  360 tablet    Refill:  3   DIAGNOSTIC DATA (LABS, IMAGING, TESTING) - I reviewed patient records, labs, notes, testing and imaging myself where available.   Labs in Nov 2023, normal CBC, TSH, VitD 21, CMP, creat 1.45, LDL 95,  HISTORY  [name] is a 62 y.o. male was seen initially on June 27 2020   Past medical history of traumatic brain injury, was involved in [place] war, [place] before he immigrated to [place] in 1986, initially he was able to work at [employer], had steady decline in functional status, suffered PTSD, some angry outbursts, gait abnormality, recurrent seizures, currently on disability   For PTSD he is taking Prozac 40 mg daily Seizure, he is on Keppra 1000 mg 1-1/2 tablets twice a day, Dilantin 100 mg 3 tablets every night, occasionally out burst, but overall improved,   Dilantin level was 8.6 in June 2021,   He lives with  his roommates and his brother, who take care of him, who make sure he takes his medicine regularly   He continues to have recurrent seizures at least once a month, had a 3 seizure on June 26, 2020, about 2 hours apart, was awake in between, he has no warning signs before he went into tonic-clonic seizure   UPDATE Jan 09 2021: He continues to have seizure 1 to twice each month, minor seizure, often recovered well, tolerating lamotrigine 100 mg twice a day, and higher dose Keppra 1000 mg 2 tablets twice a day, weaned off Dilantin successfully, mood seems to better with lamotrigine  UPDATE Jan 29 2022: He is accompanied by his brother and interpreter at today's visit, emergency room visit in January 2023, for recurrent seizure, upon arrival, patient has left arm seizure with associated nystagmus, lamotrigine level was 4.6, in 2022 was 4.8, taking lamotrigine 100 mg 2 tablets twice a day,  CT head without contrast showed no acute abnormality, cerebellar atrophy  Laboratory evaluations, May 2023, lamotrigine 4.6, normal TSH, CBC, CMP with creatinine 1.45  He is supposed to be on higher dose of lamotrigine 100 mg 2 and half tablets twice a day, however the brother was confused, did not do so, continued on Keppra 1000 mg 2 tablets twice a day  He tolerated his medication well, no agitations,  Update August 26, 2022 SS: Saw Dr. Terrace Arabia 01/29/22 increase  Lamictal 250 mg BID, continue Keppra 2000 mg twice daily. Here with interpreter via phone. Still has seizure events once weekly. Seizures are short, less than 2 minutes, lose consciousness, generalized shaking, has to rest afterwards. Not often oral injury. Unknown triggers. On Rx Vitamin D  REVIEW OF SYSTEMS: Out of a complete 14 system review of symptoms, the patient complains only of the following symptoms, and all other reviewed systems are negative.  See HPI  PHYSICAL EXAM  Vitals:   01/29/22 1515  BP: (!) 143/79  Pulse: 74  Weight: 182 lb 8 oz  (82.8 kg)  Height: 5\' 5"  (1.651 m)   Body mass index is 30.37 kg/m.  Physical Exam  General: The patient is alert and cooperative at the time of the examination.  Skin: No significant peripheral edema is noted.  Neurologic Exam  Mental status: The patient is alert .  History is provided by his brother and interpreter.  He follows exam commands.  He speaks a few words in [place]..  Cranial nerves: Facial symmetry is present. Speech is normal, no aphasia or dysarthria is noted. Extraocular movements are full. Visual fields are full.  Motor: The patient has good strength in all 4 extremities.  Sensory examination: Soft touch sensation is symmetric on the face, arms, and legs.  Coordination: The patient has good finger-nose-finger and heel-to-shin bilaterally.  Gait and station: Has a pushoff from seated position to stand, gait is slightly wide-based, mildly unsteady, uses cane  Reflexes: Deep tendon reflexes are symmetric.  ALLERGIES: No Known Allergies  HOME MEDICATIONS: Outpatient Medications Prior to Visit  Medication Sig Dispense Refill   ergocalciferol (VITAMIN D2) 1.25 MG (50000 UT) capsule Take 50,000 Units by mouth once a week.     FLUoxetine (PROZAC) 40 MG capsule Take 40 mg by mouth daily.     lamoTRIgine (LAMICTAL) 100 MG tablet Take 2.5 tablets (250 mg total) by mouth 2 (two) times daily. Patient was taking 2 tab twice a day, please discard previous order of 3 tab twice a day, will keep 2.5 tabs twice a day 450 tablet 4   levETIRAcetam (KEPPRA) 1000 MG tablet Take 2 tablets by mouth twice daily 360 tablet 0   lisinopril (ZESTRIL) 10 MG tablet Take 10 mg by mouth daily.     tamsulosin (FLOMAX) 0.4 MG CAPS capsule Take 0.4 mg by mouth.     No facility-administered medications prior to visit.    PAST MEDICAL HISTORY: Past Medical History:  Diagnosis Date   Anxiety    Back pain    low back pain/ compressed disk in lower back.    CKD (chronic kidney disease),  stage II    Depression    Hypertension    Language barrier 03-01-13   Speaks very little English,primary-Vietnamese "[name] Dialect"   Seizures ([facility])    due head trauma-seizures are less, but occurs from mild to more severe, which causes agitation to mental state.   TBI (traumatic brain injury) [facility])    CARE FOR BY BROTHER    PAST SURGICAL HISTORY: Past Surgical History:  Procedure Laterality Date   ABDOMINAL SURGERY     COLONOSCOPY N/A 03/17/2013   Procedure: COLONOSCOPY;  Surgeon: Theda Belfast, MD;  [facility];  Service: Endoscopy;  Laterality: N/A;   NO PAST SURGERIES     ? one surgery in [place]-? what, has abdominal scar.    FAMILY HISTORY: Family History  Problem Relation Age of Onset   Seizures Mother  per his nonbiological brother    SOCIAL HISTORY: Social History   Socioeconomic History   Marital status: Single    Spouse name: Not on file   Number of children: Not on file   Years of education: 4   Highest education level: Not on file  Occupational History    Employer: UNEMPLOYED  Tobacco Use   Smoking status: Former    Types: Cigarettes    Quit date: 03/02/1989    Years since quitting: 33.5   Smokeless tobacco: Never  Substance and Sexual Activity   Alcohol use: No   Drug use: No   Sexual activity: Not Currently  Other Topics Concern   Not on file  Social History Narrative   Brother and a roommate live with him       ** Merged History Encounter **    Patient is single.   Patient is disabled.   Patient has a 4th grade education.   Patient drinks two cups of coffee daily.   Social Determinants of Health   Financial Resource Strain: Not on file  Food Insecurity: Not on file  Transportation Needs: Not on file  Physical Activity: Not on file  Stress: Not on file  Social Connections: Not on file  Intimate Partner Violence: Not on file    Margie Ege, Edrick Oh, DNP  [facility] [address] [place], [place] [address] [phone]   Addendum, chart reviewed, agree on higher dose of lamotrigine 300 mg twice a day, Keppra 2000 mg twice a day, may consider VNS, while waiting if she does not have much side effect with the medication, may consider add on onfi every night

## 2022-08-27 LAB — CBC WITH DIFFERENTIAL/PLATELET
Basophils Absolute: 0 10*3/uL (ref 0.0–0.2)
Basos: 1 %
EOS (ABSOLUTE): 0.2 10*3/uL (ref 0.0–0.4)
Eos: 3 %
Hematocrit: 41 % (ref 37.5–51.0)
Hemoglobin: 14.1 g/dL (ref 13.0–17.7)
Immature Grans (Abs): 0 10*3/uL (ref 0.0–0.1)
Immature Granulocytes: 0 %
Lymphocytes Absolute: 1.5 10*3/uL (ref 0.7–3.1)
Lymphs: 26 %
MCH: 30.7 pg (ref 26.6–33.0)
MCHC: 34.4 g/dL (ref 31.5–35.7)
MCV: 89 fL (ref 79–97)
Monocytes Absolute: 0.5 10*3/uL (ref 0.1–0.9)
Monocytes: 10 %
Neutrophils Absolute: 3.4 10*3/uL (ref 1.4–7.0)
Neutrophils: 60 %
Platelets: 321 10*3/uL (ref 150–450)
RBC: 4.6 x10E6/uL (ref 4.14–5.80)
RDW: 13.1 % (ref 11.6–15.4)
WBC: 5.5 10*3/uL (ref 3.4–10.8)

## 2022-08-27 LAB — COMPREHENSIVE METABOLIC PANEL
ALT: 11 IU/L (ref 0–44)
AST: 18 IU/L (ref 0–40)
Albumin: 4.4 g/dL (ref 3.9–4.9)
Alkaline Phosphatase: 98 IU/L (ref 44–121)
BUN/Creatinine Ratio: 10 (ref 10–24)
BUN: 13 mg/dL (ref 8–27)
Bilirubin Total: 0.3 mg/dL (ref 0.0–1.2)
CO2: 24 mmol/L (ref 20–29)
Calcium: 9.2 mg/dL (ref 8.6–10.2)
Chloride: 103 mmol/L (ref 96–106)
Creatinine, Ser: 1.32 mg/dL — ABNORMAL HIGH (ref 0.76–1.27)
Globulin, Total: 2.9 g/dL (ref 1.5–4.5)
Glucose: 82 mg/dL (ref 70–99)
Potassium: 3.7 mmol/L (ref 3.5–5.2)
Sodium: 141 mmol/L (ref 134–144)
Total Protein: 7.3 g/dL (ref 6.0–8.5)
eGFR: 60 mL/min/{1.73_m2} (ref >59)

## 2022-08-27 LAB — LAMOTRIGINE LEVEL: Lamotrigine Lvl: 10.5 ug/mL (ref 2.0–20.0)

## 2022-08-27 LAB — LEVETIRACETAM LEVEL: Levetiracetam Lvl: 58 ug/mL — ABNORMAL HIGH (ref 10.0–40.0)

## 2022-09-01 NOTE — Telephone Encounter (Signed)
Please call, labs show creatinine 1.32, actually improved from baseline.  Lamictal level 10.5, Keppra level 58.  Continue with higher dose Lamictal.  Continue with current dose of Keppra, but may consider reduction in the future due to need to monitor kidney function.  Will check on him in 1 month, may consider adding Onfi 10 mg at bedtime for seizure control.  Long-term consider VNS? I talked with Dr. Terrace Arabia about this last week.

## 2022-09-02 NOTE — Telephone Encounter (Signed)
1st attempt unable to leave msg  So I was notified bye the front staff in our office to reach out to [name]: she apparently helps with special languages that [employer] does not   thru teams messages who arranged for me to call interpreter:  [employer]   Ph # [phone] interpreter  Unable to leave msg on any number belonging to the patient   The interpreter attempted to reach the pt multiple times with no success. He did state that he knew them and could tell them to call us.  I mentioned that we can try to call again Monday since we are off for the 4th of July

## 2022-09-02 NOTE — Telephone Encounter (Signed)
I called [employer] [phone] that [facility] uses and they stated that they do not support that language. My only recommendation would be to use google translate to send letter and send english and their language and hope they understand. Routing to sarah to ask permission to proceed with the plan. I did one like that for Dr. Terrace Arabia yesterday and Butch Penny np was ok with it but I would like to get the provider opinion.

## 2022-09-07 NOTE — Telephone Encounter (Signed)
Spoke to pts brother using interpreter services and relayed results and recommendations. Pts brother stated that VNS sounded ok and that he is agreeable to whatever the provider recommended as they would like to keep the seizures under control.

## 2022-09-30 ENCOUNTER — Telehealth: Payer: Self-pay

## 2022-09-30 NOTE — Telephone Encounter (Signed)
I spoke to [place], pharmacist to confirm dosing of Lamictal, I confirmed what sarah's last note said. Pharmacist stated this was a higher dose, but confirmed this was the last note stated.

## 2022-10-05 ENCOUNTER — Other Ambulatory Visit (HOSPITAL_COMMUNITY): Payer: Self-pay

## 2022-10-05 ENCOUNTER — Telehealth: Payer: Self-pay

## 2022-10-05 NOTE — Telephone Encounter (Signed)
PA request has been Submitted. New Encounter created for follow up. For additional info see Pharmacy Prior Auth telephone encounter from 10/05/2022.

## 2022-10-05 NOTE — Telephone Encounter (Signed)
Pharmacy Patient Advocate Encounter   Received notification from Physician's Office that prior authorization for lamoTRIgine 100MG  tablets is required/requested.   Insurance verification completed.   The patient is insured through [employer] .   Per test claim: PA required; PA submitted to [facility] via CoverMyMeds Key/confirmation #/EOC [id number] Status is pending

## 2022-10-05 NOTE — Telephone Encounter (Signed)
[facility] Fearing) Need PA for lamoTRIgine (LAMICTAL) 100 MG tablet  Contact info: [phone]

## 2022-10-05 NOTE — Telephone Encounter (Signed)
Pharmacy Patient Advocate Encounter  Received notification from [facility] that Prior Authorization for lamoTRIgine 100MG  tablets has been APPROVED from 03/02/2022 to 03/02/2023. Ran test claim, Copay is $1.55 per 90DS/ 540 Tablets  PA #/Case ID/Reference #: PA Case ID #: [id number]

## 2022-10-13 ENCOUNTER — Telehealth: Payer: Self-pay | Admitting: Neurology

## 2022-10-13 NOTE — Telephone Encounter (Signed)
Attempted to call using [facility] [employer], they stated they didn't support that language. Attempted private interpreter, left message to return call to office

## 2022-10-13 NOTE — Telephone Encounter (Signed)
Please call the patient to see how they are doing on higher dose Lamictal with continued Keppra.  Have seizures decreased?  If continues with frequent seizures and no change with higher dose Lamictal, we could add in Onfi 10 mg at bedtime.  Onfi is an antiseizure benzodiazepine.  It can cause sleepiness, which is why we have patients take it at night. We can also discuss at next visit further medication change. Thanks

## 2022-10-14 NOTE — Telephone Encounter (Signed)
Called interpreter y[employer] and tried to call the pt using 2 different numbers. We were unsuccessful at leaving a msg on answering machines for either number

## 2022-10-14 NOTE — Telephone Encounter (Signed)
[name]   Ph # [phone] interpreter

## 2022-10-15 NOTE — Telephone Encounter (Signed)
Unable to reach patient interpreter will try again Monday

## 2022-10-19 NOTE — Telephone Encounter (Signed)
Left msg for interpreter [phone]

## 2022-10-19 NOTE — Telephone Encounter (Signed)
Used interpreter services and tried both numbers and still not able to reach the patient

## 2022-12-12 ENCOUNTER — Ambulatory Visit: Payer: Medicare Other

## 2022-12-12 DIAGNOSIS — Z23 Encounter for immunization: Secondary | ICD-10-CM

## 2022-12-17 ENCOUNTER — Telehealth: Payer: Self-pay

## 2022-12-17 NOTE — Telephone Encounter (Signed)
Phone call to patient's emergency contact YPhi Riddle to inform him that CN scheduled appointment tomorrow 12/18/2022 @ 9:30 am with patient's PCP (per his request).  It is at [facility] [address] [place].  States he will take patient to appointment.  Brantley Fling RN, Congregational Nurse [phone]

## 2023-01-13 NOTE — Congregational Nurse Program (Signed)
CN office visit with interpreter Diu Hartshorn assisting.  Patient stated that approximately 1 week ago he was trying to cut a growth off the bottom of his left foot and accidentally cut himself.  He is having difficulty walking due to discomfort.  Patient had approximately 2 inch laceration which appears to be healing but surrounding skin is extremely hard and dry.  Recommended he apply antibiotic ointment and use a foot cream such as O'Keefe's for dry skin.  Gave patient gauze to pad and wrap foot after applying ointment.  He has PCP appointment scheduled for 02/01/2023. Advised him to go to Urgent Care if area becomes more painful, red or develops drainage.  Brantley Fling RN, Congregational Nurse [phone]

## 2023-02-12 ENCOUNTER — Emergency Department (HOSPITAL_COMMUNITY): Payer: Medicare Other

## 2023-02-12 ENCOUNTER — Other Ambulatory Visit: Payer: Self-pay

## 2023-02-12 ENCOUNTER — Emergency Department (HOSPITAL_COMMUNITY)
Admission: EM | Admit: 2023-02-12 | Discharge: 2023-02-13 | Disposition: A | Discharge status: Home / Self Care | Payer: Medicare Other | Attending: Emergency Medicine | Admitting: Emergency Medicine

## 2023-02-12 ENCOUNTER — Encounter (HOSPITAL_COMMUNITY): Payer: Self-pay

## 2023-02-12 DIAGNOSIS — M79605 Pain in left leg: Secondary | ICD-10-CM | POA: Diagnosis present

## 2023-02-12 DIAGNOSIS — R339 Retention of urine, unspecified: Secondary | ICD-10-CM | POA: Diagnosis not present

## 2023-02-12 DIAGNOSIS — M5430 Sciatica, unspecified side: Secondary | ICD-10-CM

## 2023-02-12 DIAGNOSIS — R2689 Other abnormalities of gait and mobility: Secondary | ICD-10-CM | POA: Diagnosis not present

## 2023-02-12 DIAGNOSIS — M4807 Spinal stenosis, lumbosacral region: Secondary | ICD-10-CM

## 2023-02-12 DIAGNOSIS — R262 Difficulty in walking, not elsewhere classified: Secondary | ICD-10-CM

## 2023-02-12 LAB — BASIC METABOLIC PANEL
Anion gap: 12 (ref 5–15)
BUN: 12 mg/dL (ref 8–23)
CO2: 26 mmol/L (ref 22–32)
Calcium: 9 mg/dL (ref 8.9–10.3)
Chloride: 101 mmol/L (ref 98–111)
Creatinine, Ser: 1.51 mg/dL — ABNORMAL HIGH (ref 0.61–1.24)
GFR, Estimated: 51 mL/min — ABNORMAL LOW (ref >60)
Glucose, Bld: 117 mg/dL — ABNORMAL HIGH (ref 70–99)
Potassium: 3.1 mmol/L — ABNORMAL LOW (ref 3.5–5.1)
Sodium: 139 mmol/L (ref 135–145)

## 2023-02-12 LAB — CBC
HCT: 41.7 % (ref 39.0–52.0)
Hemoglobin: 13.8 g/dL (ref 13.0–17.0)
MCH: 30.1 pg (ref 26.0–34.0)
MCHC: 33.1 g/dL (ref 30.0–36.0)
MCV: 90.8 fL (ref 80.0–100.0)
Platelets: 317 10*3/uL (ref 150–400)
RBC: 4.59 MIL/uL (ref 4.22–5.81)
RDW: 13.5 % (ref 11.5–15.5)
WBC: 10.8 10*3/uL — ABNORMAL HIGH (ref 4.0–10.5)
nRBC: 0 % (ref 0.0–0.2)

## 2023-02-12 MED ORDER — HYDROCODONE-ACETAMINOPHEN 5-325 MG PO TABS
1.0000 | ORAL_TABLET | Freq: Once | ORAL | Status: AC
Start: 2023-02-12 — End: 2023-02-12
  Administered 2023-02-12: 1 via ORAL
  Filled 2023-02-12: qty 1

## 2023-02-12 NOTE — ED Notes (Signed)
Paged montagnard language line

## 2023-02-12 NOTE — ED Triage Notes (Addendum)
Pt bib ems from home; c/o sore to bottom of L foot; cbg 106; per brother, pt states he could not walk due to sore; pt c/o pain from foot to hip; 174/82, hr 80  [name] interpreter used; pt also endorses difficulty urinating and having BMs

## 2023-02-12 NOTE — ED Provider Triage Note (Cosign Needed Addendum)
Emergency Medicine Provider Triage Evaluation Note  [name] , a 62 y.o. male  was evaluated in triage.  Pt complains of left foot pain for a long time, can't walk secondary to pain. Speaks montagnard and needs interpreter.  During interpreter evaluation he reports the whole leg hurts.  He denies any recent back injury.  He reports that he has been having trouble with peeing and pooping.  Review of Systems  Positive: Foot sore Negative: Fever, chills  Physical Exam  BP (!) 152/85 (BP Location: Right Arm)   Pulse 83   Temp 99.2 F (37.3 C) (Oral)   Resp 16   SpO2 98%  Gen:   Awake, no distress   Resp:  Normal effort  MSK:   Moves extremities without difficulty  Other:  Patient with a small sore on the bottom of the left foot with no significant evidence of surrounding infection  Medical Decision Making  Medically screening exam initiated at 5:15 PM.  Appropriate orders placed.  [name] was informed that the remainder of the evaluation will be completed by another provider, this initial triage assessment does not replace that evaluation, and the importance of remaining in the ED until their evaluation is complete.  Workup initiated in triage    Olene Floss, PA-C 02/12/23 1657    Olene Floss, [place] 02/12/23 1715

## 2023-02-12 NOTE — ED Provider Notes (Signed)
[facility] Provider Note   CSN: [id number] Arrival date & time: 02/12/23  1644     History {Add pertinent medical, surgical, social history, OB history to HPI:1} No chief complaint on file.   [name] is a 62 y.o. male.  History obtained via montagnard interpreter.   62 year old male with a history of TBI, seizures on Keppra and Lamictal, and PTSD who presents to the emergency department with left lower extremity pain.  Leg has been hurting for a year. Can't walk on it now.  Denies any injuries.  No surgeries to his leg or back.  Says that in the past 3 days it is started hurting more.  Intermittent swelling of the entire leg.  Also saying that he hurts "everywhere" when I ask him if he is having back or upper leg pain.  Was telling the initial triage providers that he was having some urinary retention as well. Last tdap in 2021. Does have a small cut on the sole of his foot but does not recall how he got it.        Home Medications Prior to Admission medications   Medication Sig Start Date End Date Taking? Authorizing Provider  ergocalciferol (VITAMIN D2) 1.25 MG (50000 UT) capsule Take 50,000 Units by mouth once a week.    [provider]  FLUoxetine (PROZAC) 40 MG capsule Take 40 mg by mouth daily.    [provider]  lamoTRIgine (LAMICTAL) 100 MG tablet Take 3 tablets (300 mg total) by mouth 2 (two) times daily. 08/26/22   Glean Salvo, NP  levETIRAcetam (KEPPRA) 1000 MG tablet Take 2 tablets (2,000 mg total) by mouth 2 (two) times daily. 08/26/22   Glean Salvo, NP  tamsulosin (FLOMAX) 0.4 MG CAPS capsule Take 0.4 mg by mouth.    [provider]      Allergies    Patient has no known allergies.    Review of Systems   Review of Systems  Physical Exam Updated Vital Signs BP (!) 142/77 (BP Location: Right Arm)   Pulse 74   Temp 98.5 F (36.9 C)   Resp 18   SpO2 98%  Physical Exam Vitals and  nursing note reviewed.  Constitutional:      General: He is not in acute distress.    Appearance: He is well-developed.  HENT:     Head: Normocephalic and atraumatic.     Right Ear: External ear normal.     Left Ear: External ear normal.     Nose: Nose normal.  Eyes:     Extraocular Movements: Extraocular movements intact.     Conjunctiva/sclera: Conjunctivae normal.     Pupils: Pupils are equal, round, and reactive to light.  Cardiovascular:     Comments: DP pulses 2+ bilaterally.  Cap refill less than 2 seconds in all 10 toes. Pulmonary:     Effort: Pulmonary effort is normal. No respiratory distress.  Musculoskeletal:     Cervical back: Normal range of motion and neck supple.     Right lower leg: No edema.     Left lower leg: No edema.     Comments: No significant joint effusion noted to left hip, left knee, or left ankle.  Full range of motion of all joints.  No erythema or warmth.  Does have a small cut on the bottom of his foot.  Full strength of the left lower extremity.  Skin:    General: Skin is  warm and dry.  Neurological:     Mental Status: He is alert. Mental status is at baseline.  Psychiatric:        Mood and Affect: Mood normal.        Behavior: Behavior normal.   Sole of left foot:   ED Results / Procedures / Treatments   Labs (all labs ordered are listed, but only abnormal results are displayed) Labs Reviewed  CBC - Abnormal; Notable for the following components:      Result Value   WBC 10.8 (*)    All other components within normal limits  BASIC METABOLIC PANEL - Abnormal; Notable for the following components:   Potassium 3.1 (*)    Glucose, Bld 117 (*)    Creatinine, Ser 1.51 (*)    GFR, Estimated 51 (*)    All other components within normal limits    EKG None  Radiology DG Foot Complete Left Result Date: 02/12/2023 CLINICAL DATA:  Foot wound EXAM: LEFT FOOT - COMPLETE 3+ VIEW COMPARISON:  09/18/2020 FINDINGS: No fracture or malalignment.  Degenerative changes at the first MTP joint. No soft tissue emphysema. No osseous destructive change IMPRESSION: No acute osseous abnormality Electronically Signed   By: Jasmine Pang M.D.   On: 02/12/2023 19:56   CT Lumbar Spine Wo Contrast Result Date: 02/12/2023 CLINICAL DATA:  Initial evaluation for acute low back pain, increased fracture risk. EXAM: CT LUMBAR SPINE WITHOUT CONTRAST TECHNIQUE: Multidetector CT imaging of the lumbar spine was performed without intravenous contrast administration. Multiplanar CT image reconstructions were also generated. RADIATION DOSE REDUCTION: This exam was performed according to the departmental dose-optimization program which includes automated exposure control, adjustment of the mA and/or kV according to patient size and/or use of iterative reconstruction technique. COMPARISON:  Prior MRI from 10/27/2014. FINDINGS: Segmentation: Standard. Lowest well-formed disc space labeled the L5-S1 level. Alignment: Physiologic with preservation of the normal lumbar lordosis. No listhesis. Vertebrae: Mild wedging deformity at the superior endplate of L1 with subtle linear lucency through the underlying L1 vertebral body. While this finding is age indeterminate, this could potentially reflect a subtle acute to subacute compression fracture (series 8, image 39). Mild chronic height loss at the superior endplate of T12, stable. Vertebral body height otherwise maintained with no other acute or chronic fracture. Visualized sacrum and pelvis intact. No worrisome osseous lesions. Paraspinal and other soft tissues: Paraspinous soft tissues demonstrate no acute finding. Mild aortic atherosclerosis. Disc levels: Mild for age degenerative spondylosis with scattered degenerative endplate spurring, most pronounced at T11-12 and L3-4. Mild degenerative disc bulging at L3-4 and L4-5. Mild lower lumbar facet hypertrophy. Resultant moderate to severe spinal stenosis at L3-4 and L4-5. IMPRESSION: 1.  Mild wedging deformity at the superior endplate of L1 with subtle linear lucency through the underlying L1 vertebral body. While this finding is age indeterminate, this could potentially reflect a subtle acute to subacute compression fracture. Correlation with physical exam for possible point tenderness at this location recommended. 2. No other acute osseous abnormality within the lumbar spine. 3. Underlying lumbar spondylosis and facet hypertrophy with resultant moderate to severe spinal stenosis at L3-4 and L4-5. 4.  Aortic Atherosclerosis (ICD10-I70.0). Electronically Signed   By: Rise Mu M.D.   On: 02/12/2023 19:10    Procedures Procedures  {Document cardiac monitor, telemetry assessment procedure when appropriate:1}  Medications Ordered in ED Medications - No data to display  ED Course/ Medical Decision Making/ A&P Clinical Course as of 02/12/23 2231  Fri Feb 12, 2023  2227 Bladder scan 116 mL [RP]    Clinical Course User Index [RP] Rondel Baton, MD   {   Click here for ABCD2, HEART and other calculatorsREFRESH Note before signing :1}                              Medical Decision Making Amount and/or Complexity of Data Reviewed Labs: ordered.   ***  {Document critical care time when appropriate:1} {Document review of labs and clinical decision tools ie heart score, Chads2Vasc2 etc:1}  {Document your independent review of radiology images, and any outside records:1} {Document your discussion with family members, caretakers, and with consultants:1} {Document social determinants of health affecting pt's care:1} {Document your decision making why or why not admission, treatments were needed:1} Final Clinical Impression(s) / ED Diagnoses Final diagnoses:  None    Rx / DC Orders ED Discharge Orders     None

## 2023-02-13 ENCOUNTER — Emergency Department (HOSPITAL_COMMUNITY): Payer: Medicare Other

## 2023-02-13 DIAGNOSIS — M79605 Pain in left leg: Secondary | ICD-10-CM | POA: Diagnosis not present

## 2023-02-13 LAB — URINALYSIS, ROUTINE W REFLEX MICROSCOPIC
Bilirubin Urine: NEGATIVE
Glucose, UA: NEGATIVE mg/dL
Ketones, ur: 20 mg/dL — AB
Leukocytes,Ua: NEGATIVE
Nitrite: NEGATIVE
Protein, ur: 100 mg/dL — AB
RBC / HPF: >50 RBC/hpf (ref 0–5)
Specific Gravity, Urine: 1.026 (ref 1.005–1.030)
pH: 5 (ref 5.0–8.0)

## 2023-02-13 MED ORDER — POTASSIUM CHLORIDE CRYS ER 20 MEQ PO TBCR
40.0000 meq | EXTENDED_RELEASE_TABLET | Freq: Once | ORAL | Status: AC
Start: 2023-02-13 — End: 2023-02-13
  Administered 2023-02-13: 40 meq via ORAL

## 2023-02-13 MED ORDER — ACETAMINOPHEN 325 MG PO TABS: 650.0000 mg | ORAL_TABLET | ORAL | Status: DC | PRN

## 2023-02-13 MED ORDER — LIDOCAINE 5 % EX PTCH: 1.0000 | MEDICATED_PATCH | CUTANEOUS | 0 refills | Status: DC

## 2023-02-13 MED ORDER — GADOBUTROL 1 MMOL/ML IV SOLN
8.0000 mL | Freq: Once | INTRAVENOUS | Status: AC | PRN
Start: 2023-02-13 — End: 2023-02-13
  Administered 2023-02-13: 8 mL via INTRAVENOUS

## 2023-02-13 MED ORDER — KETOROLAC TROMETHAMINE 15 MG/ML IJ SOLN
15.0000 mg | Freq: Once | INTRAMUSCULAR | Status: AC
  Administered 2023-02-13: 15 mg via INTRAMUSCULAR
  Filled 2023-02-13: qty 1

## 2023-02-13 MED ORDER — CYCLOBENZAPRINE HCL 10 MG PO TABS: 10.0000 mg | ORAL_TABLET | Freq: Two times a day (BID) | ORAL | 0 refills | Status: DC | PRN

## 2023-02-13 MED ORDER — LIDOCAINE 5 % EX PTCH
1.0000 | MEDICATED_PATCH | CUTANEOUS | Status: DC
  Administered 2023-02-13: 1 via TRANSDERMAL
  Filled 2023-02-13: qty 1

## 2023-02-13 NOTE — Evaluation (Signed)
Physical Therapy Evaluation Patient Details Name: [name] MRN: [id number] DOB: [birth year] Today's Date: 02/13/2023  History of Present Illness  Pt is 62 yo presenting to [facility] ED with pain in the L foot and LE that has been on-going for 2 years. Pt reports he has weakness and difficulty ambulating. PMH: TBI, seizures and PTSD.  Clinical Impression  Pt is presenting below baseline level of functioning. Prior to hospitalization pt reports he was independent with mobility. Currently pt is reporting pain that radiates from his hip to foot that gets worse in WB. Lumbar spine flexion and extension did not improve pain. Pt is describing pain that sounds neurogenic in origin and would benefit from referral to out patient neurologist. Currently pt is CGA for all functional mobility and requires a RW at [facility] after education on sequencing for gait. Due to pt current functional status, home set up and available assistance at home recommending skilled physical therapy services 3x/week in order to address strength, balance and functional mobility to decrease risk for falls, injury and re-hospitalization.           If plan is discharge home, recommend the following: A little help with walking and/or transfers;Help with stairs or ramp for entrance;Assistance with cooking/housework;Assist for transportation     Equipment Recommendations Rolling walker (2 wheels)     Functional Status Assessment Patient has had a recent decline in their functional status and demonstrates the ability to make significant improvements in function in a reasonable and predictable amount of time.     Precautions / Restrictions Precautions Precautions: Fall Restrictions Weight Bearing Restrictions Per Provider Order: No      Mobility  Bed Mobility Overal bed mobility: Modified Independent             General bed mobility comments: pt tried to push up onto the stretcher using lower rails on walker.    Transfers Overall  transfer level: Needs assistance Equipment used: Rolling walker (2 wheels) Transfers: Sit to/from Stand Sit to Stand: Contact guard assist, From elevated surface           General transfer comment: CGA for balance on first standing from stretcher    Ambulation/Gait Ambulation/Gait assistance: Contact guard assist Gait Distance (Feet): 150 Feet Assistive device: Rolling walker (2 wheels) Gait Pattern/deviations: Step-to pattern, Antalgic Gait velocity: decreased Gait velocity interpretation: <1.8 ft/sec, indicate of risk for recurrent falls   General Gait Details: heavy use of bil UE, step to gait pattern.  Stairs Stairs:  (educated pt and interpreter who has been to pt house and spoken with pt and family multiple times on how to navigate stairs including up with unaffected leg. Down with affected leg)             Balance Overall balance assessment: Needs assistance Sitting-balance support: Bilateral upper extremity supported Sitting balance-Leahy Scale: Normal     Standing balance support: Bilateral upper extremity supported, Reliant on assistive device for balance, During functional activity Standing balance-Leahy Scale: Fair Standing balance comment: no overt LOB heavy use of UE on RW         Pertinent Vitals/Pain Pain Assessment Pain Assessment: 0-10 Pain Score: 9  Pain Location: L leg Pain Descriptors / Indicators: Aching, Burning Pain Intervention(s): Monitored during session, Limited activity within patient's tolerance    Home Living Family/patient expects to be discharged to:: Private residence Living Arrangements: Other relatives (lives with brother) Available Help at Discharge: Family;Available PRN/intermittently Type of Home: House Home Access: Stairs to enter Entrance Stairs-Rails:  None Entrance Stairs-Number of Steps: 4   Home Layout: One level Home Equipment: Cane - single point      Prior Function Prior Level of Function :  Independent/Modified Independent             Mobility Comments: pt reports he had pain in his foot for 2 years but was able to walk ADLs Comments: mod I/Ind     Extremity/Trunk Assessment   Upper Extremity Assessment Upper Extremity Assessment: Overall WFL for tasks assessed    Lower Extremity Assessment Lower Extremity Assessment: LLE deficits/detail LLE Deficits / Details: pain LLE: Unable to fully assess due to pain    Cervical / Trunk Assessment Cervical / Trunk Assessment: Normal  Communication   Communication Communication: Other (comment) (interprertor utilized) Cueing Techniques: Verbal cues;Gestural cues;Tactile cues;Visual cues  Cognition Arousal: Alert Behavior During Therapy: WFL for tasks assessed/performed Overall Cognitive Status: Within Functional Limits for tasks assessed        General Comments General comments (skin integrity, edema, etc.): no noted skin issues outside boxers and gown.        Assessment/Plan    PT Assessment Patient needs continued PT services  PT Problem List Decreased strength;Decreased mobility;Pain       PT Treatment Interventions DME instruction;Therapeutic exercise;Gait training;Stair training;Functional mobility training;Therapeutic activities;Patient/family education;Neuromuscular re-education;Balance training    PT Goals (Current goals can be found in the Care Plan section)  Acute Rehab PT Goals Patient Stated Goal: to go home with family  and decrease pain PT Goal Formulation: With patient Time For Goal Achievement: 02/27/23 Potential to Achieve Goals: Fair    Frequency Min 1X/week        AM-PAC PT "6 Clicks" Mobility  Outcome Measure Help needed turning from your back to your side while in a flat bed without using bedrails?: A Little Help needed moving from lying on your back to sitting on the side of a flat bed without using bedrails?: A Little Help needed moving to and from a bed to a chair (including a  wheelchair)?: A Little Help needed standing up from a chair using your arms (e.g., wheelchair or bedside chair)?: A Little Help needed to walk in hospital room?: A Little Help needed climbing 3-5 steps with a railing? : A Little 6 Click Score: 18    End of Session Equipment Utilized During Treatment: Gait belt Activity Tolerance: Patient tolerated treatment well Patient left: in bed;with call bell/phone within reach Nurse Communication: Mobility status PT Visit Diagnosis: Unsteadiness on feet (R26.81);Pain;Other abnormalities of gait and mobility (R26.89) Pain - Right/Left: Left Pain - part of body: Leg    Time: 1610-9604 PT Time Calculation (min) (ACUTE ONLY): 41 min   Charges:   PT Evaluation $PT Eval Low Complexity: 1 Low PT Treatments $Gait Training: 8-22 mins $Therapeutic Activity: 8-22 mins PT General Charges $$ ACUTE PT VISIT: 1 Visit        Harrel Carina, DPT, CLT  Acute Rehabilitation Services Office: [phone] (Secure chat preferred)   Claudia Desanctis 02/13/2023, 4:51 PM

## 2023-02-13 NOTE — ED Provider Notes (Addendum)
Emergency Medicine Observation Re-evaluation Note  [name] is a 62 y.o. male, seen on rounds today.  Pt initially presented to the ED for complaints of Foot Pain   Plan  Current plan is for Home health PT/OT orders placed for dispo. Plan for home health PT/OT as per SW pt not a SNF candidate.    Plan for outpatient follow-up with [name]surgery given the patient has severe spinal stenosis and sciatica pain causing ambulatory dysfunction. Plan for lidocaine patch, tylenol, and flexeril for pain control.   Ernie Avena, MD 02/13/23 1705

## 2023-02-13 NOTE — Care Management (Signed)
Transition of Care [facility]) - Emergency Department Mini Assessment   Patient Details  Name: [name] MRN: [id number] Date of Birth: 09-Oct-1960  [facility] CM/SW Contact:    Lockie Pares, RN Phone Number: 02/13/2023, 2:46 PM   Clinical Narrative: 62 yo presented with Sore foot, Pain hip. Speaks [name]. Lives with brother. PING accessed, has had [facility] in past for home health. Discussed with team, not a candidate for SNF placement at this time due to having no IP stay. Reached out to Aguilita from [place] and accepted for [facility] PT OT. Walker ordered to assist patient in ambulation from [employer].    ED Mini Assessment: What brought you to the Emergency Department? : Back pain difficulty ambulating  Barriers to Discharge: ED No Barriers        Interventions which prevented an admission or readmission: Home Health Consult or Services    Patient Contact and Communications      Home with home health and DME  ,                 Admission diagnosis:  left foot pain Patient Active Problem List   Diagnosis Date Noted   Traumatic brain injury with loss of consciousness (HCC) 01/09/2021   Partial symptomatic epilepsy with complex partial seizures, not intractable, without status epilepticus (HCC) 06/27/2020   TBI (traumatic brain injury) (HCC)    Fall 07/02/2019   CKD (chronic kidney disease) stage 3, GFR 30-59 ml/min (HCC) 07/02/2019   Cognitive impairment 01/10/2019   Prolonged seizure (HCC) 09/30/2018   Noncompliance with medication regimen 11/22/2017   Altered mental status 03/27/2016   Sepsis (HCC) 07/03/2015   CKD (chronic kidney disease), stage II    Abdominal pain    Seizures (HCC)    Encounter for therapeutic drug monitoring 01/03/2014   Partial epilepsy with impairment of consciousness, intractable (HCC) 03/13/2013   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 there is no improvement in right 12/18/2011   Seizure (HCC) 12/17/2011   Altered mental state 12/17/2011   PCP:  Fleet Contras, MD Pharmacy:   [facility] [address], [place] - [address] [facility] [address] [place] [address] Phone: [phone] Fax: [phone]

## 2023-02-13 NOTE — ED Provider Notes (Signed)
Care assumed at midnight.  Patient here for evaluation of progressive left lower extremity pain, difficulty ambulating.  Care assumed pending hip x-ray and MRI lumbar spine.  MRI still pending.  Patient care transferred pending MRI.   Damon Fossa, MD 02/13/23 [phone]

## 2023-02-13 NOTE — Discharge Instructions (Addendum)
PT/OT orders have been place for home health needs. Plan for lidocaine patch, tylenol, and flexeril for pain control.

## 2023-02-13 NOTE — ED Notes (Signed)
PT was able to ambulate to the bathroom with some assistance. PT dis stumble a few times and c/o of left foot hurting.

## 2023-02-13 NOTE — ED Notes (Signed)
MRI is backed up at this time. Expected to pick PT up soon

## 2023-02-13 NOTE — ED Notes (Signed)
MRI states that there is a hold up with getting his MRI due to staffing. MRI states they will try to go get patient for scan between 8am-11am. Did update provider

## 2023-02-13 NOTE — ED Provider Notes (Signed)
Patient signed out to me at 0700 by Dr. Madilyn Hook pending MRI.  In short this is a 62 year old Montanye are not speaking patient with a history of TBI, seizures and PTSD presented to the emergency department with left lower extremity pain.  The pain has been ongoing for the last 2 years but now has developed weakness and difficulty ambulating.  Patient additionally had a wound on the bottom of the left foot.  Patient had workup performed that showed trace leukocytosis and mild hypokalemia that was repleted.  Foot x-ray showed no acute bony abnormality.  He was signed out to me pending lumbar MRI and will additionally have a foot MRI to evaluate for possible osteo-.  The patient did have bladder scan that was normal and at time of signout the patient was standing up and urinating independently in the room.  Plan will be for TOC and PT eval for his difficulty with ambulation if MRI is negative.  Clinical Course as of 02/13/23 1417  Fri Feb 12, 2023  2227 Bladder scan 116 mL [RP]  Sat Feb 13, 2023  0028 Signed out to Dr Madilyn Hook [RP]  1138 L4 Schmorl's node and degenerative changes in MRI lumbar spine, otherwise no spinal cord abnormality. [VK]  1400 Motion degraded artifact but no infection of the foot. Will be assess for possible TOC/SNF. [VK]  1416 Using [name] interpreter, spoke with patient and family who reports he has had frequent falls due to his pain and would like PT eval.  Patient placed as a TOC border. [VK]    Clinical Course User Index [RP] Rondel Baton, MD [VK] Rexford Maus, DO      Rexford Maus, [place] 02/13/23 [phone]

## 2023-02-16 ENCOUNTER — Ambulatory Visit (INDEPENDENT_AMBULATORY_CARE_PROVIDER_SITE_OTHER): Payer: Medicare Other | Admitting: Neurology

## 2023-02-16 ENCOUNTER — Encounter: Payer: Self-pay | Admitting: Neurology

## 2023-02-16 VITALS — BP 114/64 | HR 78 | Ht 67.0 in | Wt 182.0 lb

## 2023-02-16 DIAGNOSIS — S069X9S Unspecified intracranial injury with loss of consciousness of unspecified duration, sequela: Secondary | ICD-10-CM

## 2023-02-16 DIAGNOSIS — G40209 Localization-related (focal) (partial) symptomatic epilepsy and epileptic syndromes with complex partial seizures, not intractable, without status epilepticus: Secondary | ICD-10-CM | POA: Diagnosis not present

## 2023-02-16 DIAGNOSIS — M79605 Pain in left leg: Secondary | ICD-10-CM

## 2023-02-16 DIAGNOSIS — G40909 Epilepsy, unspecified, not intractable, without status epilepticus: Secondary | ICD-10-CM | POA: Diagnosis not present

## 2023-02-16 MED ORDER — GABAPENTIN 300 MG PO CAPS: 300.0000 mg | ORAL_CAPSULE | Freq: Three times a day (TID) | ORAL | 11 refills | Status: DC | PRN

## 2023-02-16 MED ORDER — CLOBAZAM 10 MG PO TABS: 10.0000 mg | ORAL_TABLET | Freq: Two times a day (BID) | ORAL | 5 refills | Status: DC
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
## 2023-03-18 ENCOUNTER — Telehealth: Payer: Self-pay | Admitting: Pharmacy Technician

## 2023-03-18 ENCOUNTER — Other Ambulatory Visit (HOSPITAL_COMMUNITY): Payer: Self-pay

## 2023-03-18 NOTE — Telephone Encounter (Signed)
Pharmacy Patient Advocate Encounter   Received notification from CoverMyMeds that prior authorization for cloBAZam 10MG  tablets is required/requested.   Insurance verification completed.   The patient is insured through [employer] .   Per test claim: PA required; PA started via CoverMyMeds. KEY [id number] . Waiting for clinical questions to populate.

## 2023-03-19 NOTE — Telephone Encounter (Signed)
Pharmacy Patient Advocate Encounter  Received notification from [facility] that Prior Authorization for cloBAZam 10MG  tablets has been APPROVED from 03-18-2023 to 02-29-2024   PA #/Case ID/Reference #: [id number]

## 2023-07-13 ENCOUNTER — Telehealth: Payer: Self-pay

## 2023-07-13 NOTE — Telephone Encounter (Signed)
 Called patient's brother to inform that CN scheduled an appointment with [facility] and [facility] in response to concern of toothache.  Appointment is Thursday 07/14/23 @ 2:30 pm.  CN will meet them there to assist with paperwork.  Raeford Bullion RN, Congregational nurse [phone]

## 2023-07-15 NOTE — Congregational Nurse Program (Signed)
 Accompanied patient and his brother to dental appointment at [facility] and [facility] for evaluation of left upper molar pain.  He has 2 options 1) see an endodontist for root canal and crown or 2) see an oral surgeon.  He was given referral for each.  Patient currently has no dental insurance.  Has Medicaid but it is only for family planning.  CN will contact DSS Medicaid worker.  Raeford Bullion RN, Congregational Nurse [phone]

## 2023-07-28 NOTE — Congregational Nurse Program (Signed)
 CN took signed authorized rep form to DSS to inquire as to why patient does not have full Medicaid.  Form ws scanned and sent to his Medicaid worker Korene Pert [phone]) with request to call CN. Raeford Bullion RN, Congregational Nurse [phone]

## 2023-08-03 ENCOUNTER — Telehealth: Payer: Self-pay

## 2023-08-03 NOTE — Telephone Encounter (Signed)
 Phone call to patient's brother to inform him that Medicaid case worker Korene Pert [phone]) called to state she has requested a review of his eligibility status since he is now 46.  She recommended calling back in 1 week if we have not heard anything.  Raeford Bullion RN, Congregational nurse [phone]

## 2023-08-25 NOTE — Congregational Nurse Program (Signed)
 Home visit with brother YPhi Wotton assisting with interpretation.  Patient continues to have toothache periodically on left upper and right lower.  He is not eligible for Medicaid therefore has not followed up with oral surgeon.  CN registered him for free dental clinic on 08/28/2023 @ 2:30 [facility] in [place] to see if they may be able to extract either of them.  Elveria Rummer RN, Congregational Nurse [phone]

## 2023-09-08 ENCOUNTER — Encounter: Payer: Self-pay | Admitting: Neurology

## 2023-09-08 ENCOUNTER — Ambulatory Visit (INDEPENDENT_AMBULATORY_CARE_PROVIDER_SITE_OTHER): Payer: Medicare Other | Admitting: Neurology

## 2023-09-08 VITALS — BP 128/74 | Ht 65.0 in | Wt 190.0 lb

## 2023-09-08 DIAGNOSIS — G40909 Epilepsy, unspecified, not intractable, without status epilepticus: Secondary | ICD-10-CM

## 2023-09-08 MED ORDER — LAMOTRIGINE 100 MG PO TABS: 300.0000 mg | ORAL_TABLET | Freq: Two times a day (BID) | ORAL | 3 refills | Status: AC

## 2023-09-08 MED ORDER — CLOBAZAM 10 MG PO TABS: 10.0000 mg | ORAL_TABLET | Freq: Every day | ORAL | 5 refills | Status: AC

## 2023-09-08 MED ORDER — LEVETIRACETAM 1000 MG PO TABS: 2000.0000 mg | ORAL_TABLET | Freq: Two times a day (BID) | ORAL | 3 refills | Status: AC
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
## 2023-09-08 NOTE — Patient Instructions (Signed)
 Add on Onfi  10 mg at bedtime for better seizure control.  Continue current dose of Keppra  and Lamictal .  Check labs today.  Please document seizure frequency.  Follow-up in 6 months.  Please consider VNS.

## 2023-09-09 LAB — CBC WITH DIFFERENTIAL/PLATELET
Basophils Absolute: 0.1 x10E3/uL (ref 0.0–0.2)
Basos: 1 %
EOS (ABSOLUTE): 0.3 x10E3/uL (ref 0.0–0.4)
Eos: 3 %
Hematocrit: 44 % (ref 37.5–51.0)
Hemoglobin: 14.4 g/dL (ref 13.0–17.7)
Immature Grans (Abs): 0 x10E3/uL (ref 0.0–0.1)
Immature Granulocytes: 0 %
Lymphocytes Absolute: 1.9 x10E3/uL (ref 0.7–3.1)
Lymphs: 21 %
MCH: 29.9 pg (ref 26.6–33.0)
MCHC: 32.7 g/dL (ref 31.5–35.7)
MCV: 91 fL (ref 79–97)
Monocytes Absolute: 0.8 x10E3/uL (ref 0.1–0.9)
Monocytes: 9 %
Neutrophils Absolute: 5.9 x10E3/uL (ref 1.4–7.0)
Neutrophils: 66 %
Platelets: 308 x10E3/uL (ref 150–450)
RBC: 4.82 x10E6/uL (ref 4.14–5.80)
RDW: 13.1 % (ref 11.6–15.4)
WBC: 8.9 x10E3/uL (ref 3.4–10.8)

## 2023-09-09 LAB — COMPREHENSIVE METABOLIC PANEL WITH GFR
ALT: 12 IU/L (ref 0–44)
AST: 16 IU/L (ref 0–40)
Albumin: 4.5 g/dL (ref 3.9–4.9)
Alkaline Phosphatase: 107 IU/L (ref 44–121)
BUN/Creatinine Ratio: 12 (ref 10–24)
BUN: 17 mg/dL (ref 8–27)
Bilirubin Total: 0.6 mg/dL (ref 0.0–1.2)
CO2: 24 mmol/L (ref 20–29)
Calcium: 9.1 mg/dL (ref 8.6–10.2)
Chloride: 101 mmol/L (ref 96–106)
Creatinine, Ser: 1.41 mg/dL — ABNORMAL HIGH (ref 0.76–1.27)
Globulin, Total: 3.2 g/dL (ref 1.5–4.5)
Glucose: 106 mg/dL — ABNORMAL HIGH (ref 70–99)
Potassium: 3.4 mmol/L — ABNORMAL LOW (ref 3.5–5.2)
Sodium: 142 mmol/L (ref 134–144)
Total Protein: 7.7 g/dL (ref 6.0–8.5)
eGFR: 55 mL/min/1.73 — ABNORMAL LOW (ref >59)

## 2023-09-09 LAB — LAMOTRIGINE LEVEL: Lamotrigine Lvl: 9.2 ug/mL (ref 2.0–20.0)

## 2023-09-09 LAB — LEVETIRACETAM LEVEL: Levetiracetam Lvl: 64.3 ug/mL — ABNORMAL HIGH (ref 10.0–40.0)

## 2023-09-10 ENCOUNTER — Ambulatory Visit: Payer: Self-pay | Admitting: Neurology

## 2023-09-13 NOTE — Telephone Encounter (Signed)
-----   Message from Lauraine JINNY Born sent at 09/10/2023  6:33 AM EDT ----- Please call, labs show continued elevated kidney function creatinine 1.41, mildly low potassium. Make sure drinking enough water. Please forward to PCP. We will continue with plan to add on Onfi  and  continue Lamictal  and Keppra . Call his brother call us  for any issues. Thanks, Sarah ----- Message ----- From: Rebecka Memos Lab Results In Sent: 09/09/2023   7:37 AM EDT To: Lauraine JINNY Born, NP

## 2023-09-26 ENCOUNTER — Emergency Department (HOSPITAL_COMMUNITY): Admission: EM | Admit: 2023-09-26 | Discharge: 2023-09-26 | Disposition: A | Discharge status: Home / Self Care

## 2023-09-26 ENCOUNTER — Encounter (HOSPITAL_COMMUNITY): Payer: Self-pay

## 2023-09-26 ENCOUNTER — Other Ambulatory Visit: Payer: Self-pay

## 2023-09-26 ENCOUNTER — Emergency Department (HOSPITAL_COMMUNITY)

## 2023-09-26 DIAGNOSIS — Z87820 Personal history of traumatic brain injury: Secondary | ICD-10-CM | POA: Diagnosis not present

## 2023-09-26 DIAGNOSIS — W19XXXA Unspecified fall, initial encounter: Secondary | ICD-10-CM | POA: Insufficient documentation

## 2023-09-26 DIAGNOSIS — G319 Degenerative disease of nervous system, unspecified: Secondary | ICD-10-CM | POA: Diagnosis not present

## 2023-09-26 DIAGNOSIS — R7989 Other specified abnormal findings of blood chemistry: Secondary | ICD-10-CM | POA: Diagnosis not present

## 2023-09-26 DIAGNOSIS — M79672 Pain in left foot: Secondary | ICD-10-CM | POA: Diagnosis present

## 2023-09-26 DIAGNOSIS — I6782 Cerebral ischemia: Secondary | ICD-10-CM | POA: Diagnosis not present

## 2023-09-26 DIAGNOSIS — G40909 Epilepsy, unspecified, not intractable, without status epilepticus: Secondary | ICD-10-CM | POA: Diagnosis not present

## 2023-09-26 DIAGNOSIS — R413 Other amnesia: Secondary | ICD-10-CM | POA: Diagnosis not present

## 2023-09-26 LAB — COMPREHENSIVE METABOLIC PANEL WITH GFR
ALT: 11 U/L (ref 0–44)
AST: 19 U/L (ref 15–41)
Albumin: 4 g/dL (ref 3.5–5.0)
Alkaline Phosphatase: 91 U/L (ref 38–126)
Anion gap: 12 (ref 5–15)
BUN: 18 mg/dL (ref 8–23)
CO2: 26 mmol/L (ref 22–32)
Calcium: 9.2 mg/dL (ref 8.9–10.3)
Chloride: 101 mmol/L (ref 98–111)
Creatinine, Ser: 1.67 mg/dL — ABNORMAL HIGH (ref 0.61–1.24)
GFR, Estimated: 45 mL/min — ABNORMAL LOW (ref >60)
Glucose, Bld: 97 mg/dL (ref 70–99)
Potassium: 3.5 mmol/L (ref 3.5–5.1)
Sodium: 139 mmol/L (ref 135–145)
Total Bilirubin: 0.8 mg/dL (ref 0.0–1.2)
Total Protein: 7.6 g/dL (ref 6.5–8.1)

## 2023-09-26 LAB — CBC WITH DIFFERENTIAL/PLATELET
Abs Immature Granulocytes: 0.02 K/uL (ref 0.00–0.07)
Basophils Absolute: 0 K/uL (ref 0.0–0.1)
Basophils Relative: 0 %
Eosinophils Absolute: 0.3 K/uL (ref 0.0–0.5)
Eosinophils Relative: 3 %
HCT: 42.8 % (ref 39.0–52.0)
Hemoglobin: 14.3 g/dL (ref 13.0–17.0)
Immature Granulocytes: 0 %
Lymphocytes Relative: 18 %
Lymphs Abs: 1.4 K/uL (ref 0.7–4.0)
MCH: 30.5 pg (ref 26.0–34.0)
MCHC: 33.4 g/dL (ref 30.0–36.0)
MCV: 91.3 fL (ref 80.0–100.0)
Monocytes Absolute: 0.6 K/uL (ref 0.1–1.0)
Monocytes Relative: 8 %
Neutro Abs: 5.4 K/uL (ref 1.7–7.7)
Neutrophils Relative %: 71 %
Platelets: 306 K/uL (ref 150–400)
RBC: 4.69 MIL/uL (ref 4.22–5.81)
RDW: 14 % (ref 11.5–15.5)
WBC: 7.7 K/uL (ref 4.0–10.5)
nRBC: 0 % (ref 0.0–0.2)

## 2023-09-26 LAB — ETHANOL: Alcohol, Ethyl (B): <15 mg/dL (ref <15)

## 2023-09-26 LAB — TROPONIN I (HIGH SENSITIVITY)
Troponin I (High Sensitivity): 6 ng/L (ref <18)
Troponin I (High Sensitivity): 5 ng/L (ref <18)

## 2023-09-26 LAB — CBG MONITORING, ED: Glucose-Capillary: 89 mg/dL (ref 70–99)

## 2023-09-26 NOTE — ED Notes (Signed)
 Pt brother at bedside.

## 2023-09-26 NOTE — ED Provider Notes (Signed)
 [place] [facility] Provider Note   CSN: [id number] Arrival date & time: 09/26/23  1256     Patient presents with: Fall  History obtained through patient through use of [name] interpreter  [name] is a 63 y.o. male with history of TBI and memory loss, seizure disorder on Keppra  and Lamictal , PTSD, chronic left leg and foot pain, brought in by EMS.  EMS reports they were called by bystanders who saw the patient laying on the side of the road.  He was a couple feet away from a wheelchair he was pushing.  Unknown if there is any loss of consciousness or if he hit his head.  He reports he has felt dizziness intermittently, but no dizziness currently.  Denies any chest pain or shortness of breath. Reports left foot pain.     Fall Pertinent negatives include no chest pain, no headaches and no shortness of breath.       Prior to Admission medications   Medication Sig Start Date End Date Taking? Authorizing Provider  cloBAZam  (ONFI ) 10 MG tablet Take 1 tablet (10 mg total) by mouth at bedtime. 09/08/23   Gayland Lauraine PARAS, NP  cyclobenzaprine  (FLEXERIL ) 10 MG tablet Take 1 tablet (10 mg total) by mouth 2 (two) times daily as needed for muscle spasms. Patient not taking: Reported on 09/08/2023 02/13/23   Jerrol Agent, MD  ergocalciferol  (VITAMIN D2) 1.25 MG (50000 UT) capsule Take 50,000 Units by mouth once a week. Patient not taking: Reported on 09/08/2023    [provider]  FLUoxetine  (PROZAC ) 40 MG capsule Take 40 mg by mouth daily.    [provider]  gabapentin  (NEURONTIN ) 300 MG capsule Take 1 capsule (300 mg total) by mouth 3 (three) times daily as needed. 02/16/23   Onita Duos, MD  lamoTRIgine  (LAMICTAL ) 100 MG tablet Take 3 tablets (300 mg total) by mouth 2 (two) times daily. 09/08/23   Gayland Lauraine PARAS, NP  levETIRAcetam  (KEPPRA ) 1000 MG tablet Take 2 tablets (2,000 mg total) by mouth 2 (two) times daily. 09/08/23   Gayland Lauraine PARAS, NP  lidocaine  (LIDODERM ) 5 % Place 1 patch onto the skin daily. Remove & Discard patch within 12 hours or as directed by MD Patient not taking: Reported on 09/08/2023 02/13/23   Jerrol Agent, MD  LINZESS 72 MCG capsule Take 72 mcg by mouth every morning. Patient not taking: Reported on 09/08/2023 01/19/23   [provider]  tamsulosin  (FLOMAX ) 0.4 MG CAPS capsule Take 0.4 mg by mouth.    [provider]    Allergies: Patient has no known allergies.    Review of Systems  Respiratory:  Negative for shortness of breath.   Cardiovascular:  Negative for chest pain.  Neurological:  Negative for headaches.    Updated Vital Signs BP (!) 148/80   Pulse 73   Temp 98.2 F (36.8 C) (Oral)   Resp 19   SpO2 99%   Physical Exam Vitals and nursing note reviewed.  Constitutional:      General: He is not in acute distress.    Appearance: He is well-developed.     Comments: Alert and answering questions appropriately  HENT:     Head: Normocephalic and atraumatic.  Eyes:     Extraocular Movements: Extraocular movements intact.     Conjunctiva/sclera: Conjunctivae normal.     Pupils: Pupils are equal, round, and reactive to light.  Cardiovascular:     Rate and Rhythm: Normal  rate and regular rhythm.     Heart sounds: No murmur heard.    Comments: Radial and pedal pulses 2+ bilaterally Pulmonary:     Effort: Pulmonary effort is normal. No respiratory distress.     Breath sounds: Normal breath sounds.  Abdominal:     Palpations: Abdomen is soft.     Tenderness: There is no abdominal tenderness.  Musculoskeletal:        General: No swelling.     Cervical back: Neck supple.     Comments: General No obvious deformity. No erythema, edema, contusions, open wounds   Palpation Non-tender to palpation of the clavicles,humerus, radius and ulna, carpal bones, 1st-5th metacarpals and phalanges bilaterally Non tender over the femur, patella, tibia or fibula bilaterally. No  tenderness of the calves bilaterally.   Non-tender over the cervical, thoracic, or lumbar spinous processes. Non-tender to palpation of the paraspinal region of the back or chest wall diffusely  No tenderness of the pelvis diffusely   ROM Full ROM of shoulders bilaterally Full elbow, wrist, knee flexion and extension bilaterally Intact plantarflexion and dorsiflexion, hip flexion bilaterally  Patient ambulated with walker, was able to do this without difficulty.  Sensation: Sensation intact throughout the bilateral upper and lower extremity  Strength: 5/5 strength with resisted elbow and wrist flexion and extension bilaterally 5/5 strength with resisted knee flexion and extension and ankle plantarflexion and dorsiflexion bilaterally    Skin:    General: Skin is warm and dry.     Capillary Refill: Capillary refill takes less than 2 seconds.  Neurological:     Mental Status: He is alert.  Psychiatric:        Mood and Affect: Mood normal.     (all labs ordered are listed, but only abnormal results are displayed) Labs Reviewed  COMPREHENSIVE METABOLIC PANEL WITH GFR - Abnormal; Notable for the following components:      Result Value   Creatinine, Ser 1.67 (*)    GFR, Estimated 45 (*)    All other components within normal limits  CBC WITH DIFFERENTIAL/PLATELET  ETHANOL  URINALYSIS, ROUTINE W REFLEX MICROSCOPIC  CBG MONITORING, ED  TROPONIN I (HIGH SENSITIVITY)  TROPONIN I (HIGH SENSITIVITY)    EKG: EKG Interpretation Date/Time:  Sunday September 26 2023 13:22:04 EDT Ventricular Rate:  85 PR Interval:  167 QRS Duration:  107 QT Interval:  395 QTC Calculation: 473 R Axis:   21  Text Interpretation: Sinus rhythm Artifact in lead(s) II III aVL aVF V3 V4 V5 V6 Nonspecific ST-T changes Confirmed by Ula Barter [phone]) on 09/26/2023 1:32:48 PM  Radiology: CT Head Wo Contrast Result Date: 09/26/2023 CLINICAL DATA:  Blunt head trauma. EXAM: CT HEAD WITHOUT CONTRAST TECHNIQUE:  Contiguous axial images were obtained from the base of the skull through the vertex without intravenous contrast. RADIATION DOSE REDUCTION: This exam was performed according to the departmental dose-optimization program which includes automated exposure control, adjustment of the mA and/or kV according to patient size and/or use of iterative reconstruction technique. COMPARISON:  03/04/2021 FINDINGS: Brain: No evidence of intracranial hemorrhage, acute infarction, hydrocephalus, extra-axial collection, or mass lesion/mass effect. Moderate cerebral atrophy noted. Mild chronic small vessel disease. 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 Vascular:  No hyperdense vessel or other acute findings. Skull: No evidence of fracture or other significant bone abnormality. Sinuses/Orbits:  No acute findings. Other: None. IMPRESSION: No acute intracranial abnormality. Moderate cerebral atrophy and mild chronic small vessel disease. Old bilateral basal ganglia and right thalamic  lacunar infarcts. Electronically Signed   By: Norleen DELENA Kil M.D.   On: 09/26/2023 14:19     Procedures   Medications Ordered in the ED - No data to display  Clinical Course as of 09/26/23 1606  Sun Sep 26, 2023  1534 Patient reports some difficulty starting urine stream. Bladder scan with . No signs of urinary retention [AF]  1559 Fall, ambulated here at baseline. Brother was here, pt baseline mentation hx of TBI. FU on trop. If neg dc home [BH]  1600 Patient ambulated with walker here without difficulty.  Reports he is still feeling well and at baseline. [AF]    Clinical Course User Index [AF] Veta Palma, PA-C [BH] Henderly, Rosebud DELENA, PA-C                                 Medical Decision Making Amount and/or Complexity of Data Reviewed Labs: ordered. Radiology: ordered.     Differential diagnosis includes but is not limited to mechanical fall, syncopal event, orthostatic hypotension, seizure,  ACS, DVT, fracture, dislocation  ED Course:  Upon initial evaluation, patient is well-appearing, no acute distress.  Stable vitals.  Does not have any tenderness palpation of the upper or lower extremities diffusely.  No cervical, thoracic, or lumbar spinal tenderness palpation.  Given unwitnessed fall, will obtain CT head and basic labs.    Labs Ordered: I Ordered, and personally interpreted labs.  The pertinent results include:   CMP with elevated creatinine 1.67, appears patient baseline is around 1.5.  No electrolyte abnormalities.  No elevation in LFTs CBC within normal limits Ethanol level undetectable CBG 89 upon arrival Pending troponin  Imaging Studies ordered: I ordered imaging studies including CT head I independently visualized the imaging with scope of interpretation limited to determining acute life threatening conditions related to emergency care. Imaging showed  No acute abnormality I agree with the radiologist interpretation   Cardiac Monitoring: / EKG: The patient was maintained on a cardiac monitor.  I personally viewed and interpreted the cardiac monitored which showed an underlying rhythm of: Normal sinus rhythm    Medications Given: None  Upon re-evaluation, patient remains well-appearing with stable vitals.  His brother was at bedside and I was able to talk to him.  Brother states patient is currently at baseline.  Reports patient has sometimes been unsteady on his feet previously.  Brother states he does have a history of pain in the left foot where patient is complaining of pain today. This does seem chronic in nature and does not need further evaluation at this time. No signs of acute fracture, dislocation as he does not have any bony TTP. No edema or calf tenderness to suggest PE. Good distal pulses bilaterally. No skin changes or wounds.  Mother is patient's caregiver and reports that patient has been taking his medications, including seizure medications, as  scheduled. Labs are reassuring.  No electrolyte abnormalities, no anemia.  Ethanol level undetectable.  Patient does not appear clinically intoxicated with any other substance. Feel fall was most likely mechanical due to brother reporting patient sometimes is unsteady on his feet.  No postictal state or seizure-like activity noted by EMS, low concern for seizure.  Still waiting troponins to ensure this is not cardiac in nature, but does not sound like it from the history.  Patient was able to ambulate with walker    Impression: Unwitnessed fall  Disposition:  Care of this  patient signed out to oncoming ED provider Britni Henderly, PA-C to follow-up on troponins.  If troponins are flat, feel patient would likely be appropriate for discharge home.  Disposition and treatment plan pending imaging results and clinical judgment of oncoming ED team.      Record Review: External records from outside source obtained and reviewed including neurology note on 09/08/2023 for seizures     This chart was dictated using voice recognition software, Dragon. Despite the best efforts of this provider to proofread and correct errors, errors may still occur which can change documentation meaning.       Final diagnoses:  Fall, initial encounter    ED Discharge Orders     None          Veta Palma, DEVONNA 09/26/23 1606    Ula Prentice SAUNDERS, MD 09/27/23 [phone]

## 2023-09-26 NOTE — ED Triage Notes (Signed)
 Patient BIB [facility] from the road he lives on where he was found by bystanders several feet from his wheelchair which was left in the middle of the road. They helped him in it and called 911. Patient said hospital and doctor to EMS and using the interpreter he complains of L foot pain and had dizziness but not anymore. He also reports that he does not know why he has a wheelchair. Hx of TBI and seizures.

## 2023-09-26 NOTE — ED Notes (Signed)
 Patient transported to CT

## 2023-09-26 NOTE — Discharge Instructions (Addendum)
 Your workup is reassuring today.  Your CT of the head did not show any signs of a skull fracture or brain bleed.  Your blood counts, electrolytes, kidney, and liver function were normal today.  Your creatinine which is a measure of your kidney function was slightly elevated today.  This may indicate you were dehydrated earlier.  Please increase your intake of water at home.  Please have your PCP repeat your kidney function lab (creatinine) within the next month.  Please follow-up with your PCP within the next month for further evaluation as to your unsteadiness when walking.  You may benefit from a physical therapy referral.  Please return to the ER for any severe headache, seizures, any other new or concerning symptoms

## 2023-09-26 NOTE — ED Notes (Signed)
 Family updated as to patient's status. Brother called using number in chart with patient's consent.

## 2023-09-26 NOTE — ED Provider Notes (Signed)
 Care assumed from previous provider.  See note for full HPI.  63 year old here for unwitnessed fall.  He has been chronic left leg pain.  Uses crutch or cane at baseline.  Patient denies any complaints currently.  Plan on follow-up on delta troponin.  If negative can discharge home Physical Exam  BP (!) 156/78   Pulse 72   Temp 98.2 F (36.8 C)   Resp 19   SpO2 100%   Physical Exam Vitals and nursing note reviewed.  Constitutional:      General: He is not in acute distress.    Appearance: He is well-developed. He is not ill-appearing or diaphoretic.  HENT:     Head: Atraumatic.  Eyes:     Pupils: Pupils are equal, round, and reactive to light.  Cardiovascular:     Rate and Rhythm: Normal rate and regular rhythm.  Pulmonary:     Effort: Pulmonary effort is normal. No respiratory distress.  Abdominal:     General: There is no distension.     Palpations: Abdomen is soft.  Musculoskeletal:        General: Normal range of motion.     Cervical back: Normal range of motion and neck supple.  Skin:    General: Skin is warm and dry.  Neurological:     General: No focal deficit present.     Mental Status: He is alert and oriented to person, place, and time.     Procedures  Procedures Labs Reviewed  COMPREHENSIVE METABOLIC PANEL WITH GFR - Abnormal; Notable for the following components:      Result Value   Creatinine, Ser 1.67 (*)    GFR, Estimated 45 (*)    All other components within normal limits  CBC WITH DIFFERENTIAL/PLATELET  ETHANOL  URINALYSIS, ROUTINE W REFLEX MICROSCOPIC  CBG MONITORING, ED  TROPONIN I (HIGH SENSITIVITY)  TROPONIN I (HIGH SENSITIVITY)   CT Head Wo Contrast Result Date: 09/26/2023 CLINICAL DATA:  Blunt head trauma. EXAM: CT HEAD WITHOUT CONTRAST TECHNIQUE: Contiguous axial images were obtained from the base of the skull through the vertex without intravenous contrast. RADIATION DOSE REDUCTION: This exam was performed according to the departmental  dose-optimization program which includes automated exposure control, adjustment of the mA and/or kV according to patient size and/or use of iterative reconstruction technique. COMPARISON:  03/04/2021 FINDINGS: Brain: No evidence of intracranial hemorrhage, acute infarction, hydrocephalus, extra-axial collection, or mass lesion/mass effect. Moderate cerebral atrophy noted. Mild chronic small vessel disease. 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 Vascular:  No hyperdense vessel or other acute findings. Skull: No evidence of fracture or other significant bone abnormality. Sinuses/Orbits:  No acute findings. Other: None. IMPRESSION: No acute intracranial abnormality. Moderate cerebral atrophy and mild chronic small vessel disease. 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 Electronically Signed   By: Norleen DELENA Kil M.D.   On: 09/26/2023 14:19    ED Course / MDM   Clinical Course as of 09/26/23 1721  Sun Sep 26, 2023  1534 Patient reports some difficulty starting urine stream. Bladder scan with . No signs of urinary retention [AF]  1559 Fall, ambulated here at baseline. Brother was here, pt baseline mentation hx of TBI. FU on trop. If neg dc home [BH]  1600 Patient ambulated with walker here without difficulty.  Reports he is still feeling well and at baseline. [AF]    Clinical Course User Index [AF] Veta Palma, PA-C [BH] Jacoya Bauman A, PA-C    Labs  and imaging personally viewed interpreted  Delta troponin flat.  Patient stable for DC home.  Ambulatory here.  Tolerating p.o. intake.  No focal deficits  Will have him follow-up outpatient.  Low suspicion for acute ACS, PE, dissection, CVA, seizure-like activity, sepsis, occult fracture, dislocation, bacterial infectious process, electrolyte abnormality, arrhythmia, rhabdomyolysis  The patient has been appropriately medically screened and/or stabilized in the ED. I have low suspicion for  any other emergent medical condition which would require further screening, evaluation or treatment in the ED or require inpatient management.  Patient is hemodynamically stable and in no acute distress.  Patient able to ambulate in department prior to ED.  Evaluation does not show acute pathology that would require ongoing or additional emergent interventions while in the emergency department or further inpatient treatment.  I have discussed the diagnosis with the patient and answered all questions.  Pain is been managed while in the emergency department and patient has no further complaints prior to discharge.  Patient is comfortable with plan discussed in room and is stable for discharge at this time.  I have discussed strict return precautions for returning to the emergency department.  Patient was encouraged to follow-up with PCP/specialist refer to at discharge.     Medical Decision Making Amount and/or Complexity of Data Reviewed External Data Reviewed: labs, radiology, ECG and notes. Labs: ordered. Decision-making details documented in ED Course. Radiology: ordered and independent interpretation performed. Decision-making details documented in ED Course. ECG/medicine tests: ordered and independent interpretation performed. Decision-making details documented in ED Course.  Risk OTC drugs. Prescription drug management. Decision regarding hospitalization. Diagnosis or treatment significantly limited by social determinants of health.          Edie Rosebud LABOR, PA-C 09/26/23 1722    Dean Clarity, MD 09/26/23 [phone]

## 2023-09-27 ENCOUNTER — Telehealth: Payer: Self-pay

## 2023-09-27 NOTE — Telephone Encounter (Signed)
 Scheduled ED follow-up appointment with PCP Dr.Edwin Avbuere on 10/06/2023 @ 3:15 pm per request of patient's brother Yphi Schlarb.  Also asked if patient is still having dental pain and he stated it comes and goes but is triggered by eating.  He has appointment scheduled for [facility] (located at [facility] on 10/02/2023 at 2:30 pm but doesn't know how to find it.  CN will assist with helping them find location. Elveria Rummer RN, Congregational nurse [phone]

## 2023-09-30 ENCOUNTER — Encounter (HOSPITAL_COMMUNITY): Payer: Self-pay

## 2023-09-30 ENCOUNTER — Emergency Department (HOSPITAL_COMMUNITY): Admission: EM | Admit: 2023-09-30 | Discharge: 2023-10-01 | Disposition: A | Discharge status: Home / Self Care

## 2023-09-30 ENCOUNTER — Emergency Department (HOSPITAL_COMMUNITY)

## 2023-09-30 DIAGNOSIS — R338 Other retention of urine: Secondary | ICD-10-CM

## 2023-09-30 DIAGNOSIS — L089 Local infection of the skin and subcutaneous tissue, unspecified: Secondary | ICD-10-CM | POA: Diagnosis not present

## 2023-09-30 DIAGNOSIS — R262 Difficulty in walking, not elsewhere classified: Secondary | ICD-10-CM | POA: Diagnosis not present

## 2023-09-30 DIAGNOSIS — R339 Retention of urine, unspecified: Secondary | ICD-10-CM | POA: Insufficient documentation

## 2023-09-30 DIAGNOSIS — R531 Weakness: Secondary | ICD-10-CM | POA: Diagnosis present

## 2023-09-30 LAB — COMPREHENSIVE METABOLIC PANEL WITH GFR
ALT: 11 U/L (ref 0–44)
AST: 17 U/L (ref 15–41)
Albumin: 3.6 g/dL (ref 3.5–5.0)
Alkaline Phosphatase: 82 U/L (ref 38–126)
Anion gap: 8 (ref 5–15)
BUN: 12 mg/dL (ref 8–23)
CO2: 25 mmol/L (ref 22–32)
Calcium: 9 mg/dL (ref 8.9–10.3)
Chloride: 104 mmol/L (ref 98–111)
Creatinine, Ser: 1.51 mg/dL — ABNORMAL HIGH (ref 0.61–1.24)
GFR, Estimated: 51 mL/min — ABNORMAL LOW (ref >60)
Glucose, Bld: 101 mg/dL — ABNORMAL HIGH (ref 70–99)
Potassium: 3.1 mmol/L — ABNORMAL LOW (ref 3.5–5.1)
Sodium: 137 mmol/L (ref 135–145)
Total Bilirubin: 0.8 mg/dL (ref 0.0–1.2)
Total Protein: 7.2 g/dL (ref 6.5–8.1)

## 2023-09-30 LAB — CBC WITH DIFFERENTIAL/PLATELET
Abs Immature Granulocytes: 0.01 K/uL (ref 0.00–0.07)
Basophils Absolute: 0 K/uL (ref 0.0–0.1)
Basophils Relative: 0 %
Eosinophils Absolute: 0.4 K/uL (ref 0.0–0.5)
Eosinophils Relative: 5 %
HCT: 39 % (ref 39.0–52.0)
Hemoglobin: 12.9 g/dL — ABNORMAL LOW (ref 13.0–17.0)
Immature Granulocytes: 0 %
Lymphocytes Relative: 20 %
Lymphs Abs: 1.5 K/uL (ref 0.7–4.0)
MCH: 30.2 pg (ref 26.0–34.0)
MCHC: 33.1 g/dL (ref 30.0–36.0)
MCV: 91.3 fL (ref 80.0–100.0)
Monocytes Absolute: 0.7 K/uL (ref 0.1–1.0)
Monocytes Relative: 10 %
Neutro Abs: 4.7 K/uL (ref 1.7–7.7)
Neutrophils Relative %: 65 %
Platelets: 276 K/uL (ref 150–400)
RBC: 4.27 MIL/uL (ref 4.22–5.81)
RDW: 14 % (ref 11.5–15.5)
WBC: 7.3 K/uL (ref 4.0–10.5)
nRBC: 0 % (ref 0.0–0.2)

## 2023-09-30 LAB — URINALYSIS, W/ REFLEX TO CULTURE (INFECTION SUSPECTED)
Bacteria, UA: NONE SEEN
Bilirubin Urine: NEGATIVE
Glucose, UA: NEGATIVE mg/dL
Ketones, ur: NEGATIVE mg/dL
Leukocytes,Ua: NEGATIVE
Nitrite: NEGATIVE
Protein, ur: NEGATIVE mg/dL
Specific Gravity, Urine: 1.018 (ref 1.005–1.030)
pH: 5 (ref 5.0–8.0)

## 2023-09-30 LAB — CBG MONITORING, ED: Glucose-Capillary: 112 mg/dL — ABNORMAL HIGH (ref 70–99)

## 2023-09-30 MED ORDER — CEPHALEXIN 250 MG PO CAPS
500.0000 mg | ORAL_CAPSULE | Freq: Once | ORAL | Status: AC
  Administered 2023-09-30: 500 mg via ORAL
  Filled 2023-09-30: qty 2

## 2023-09-30 MED ORDER — CEPHALEXIN 500 MG PO CAPS: 500.0000 mg | ORAL_CAPSULE | Freq: Four times a day (QID) | ORAL | 0 refills | Status: DC

## 2023-09-30 NOTE — Care Management (Signed)
 ED RNCM consulted by EDP for [facility] services. Met with patient and brother Huffaker,Ypai [phone] interpreter machine at bedside.   Discussed HH recommendations stated the patient had HH before and it helped.  No preference of HH.  Patient has DME at home r/w. Referral sent and accepted to [facility] Start of Care 8/1 as per [facility]

## 2023-09-30 NOTE — ED Notes (Signed)
 Interpreter called, they state they do not have a language called montagnard dega

## 2023-09-30 NOTE — ED Provider Triage Note (Signed)
 Emergency Medicine Provider Triage Evaluation Note  [name] , a 63 y.o. male  was evaluated in triage.  Pt complains of confusion, interpreter not being able to have him respond outside of repeating the phone number that he has written down.  Called phone number and has got no response.  Patient is able to follow instructions with able to mimic movements.  Able to tell me his name.  Does not provide any other further information.   Went to neighbors house this morning and had them call EMS.  EMS unable to differentiate a complaint from patient.  Was able to get in contact with brother who said that his last known normal was 2 days ago.  States that he was able to talk to the patient and patient said that he had some leg pain that was present since last time he was seen in the hospital.  Was told that he did not know how to pick up a phone and came into the emergency department.  Review of Systems  Positive: N/a Negative: N/a  Physical Exam  BP 115/70 (BP Location: Left Arm)   Pulse 74   Temp 98.8 F (37.1 C)   Resp (!) 23   SpO2 97%  Gen:   Awake, no distress   Resp:  Normal effort  MSK:   Moves extremities without difficulty  Other:  Normal grip strength bilaterally  Medical Decision Making  Medically screening exam initiated at 3:41 PM.  Appropriate orders placed.  [name] was informed that the remainder of the evaluation will be completed by another provider, this initial triage assessment does not replace that evaluation, and the importance of remaining in the ED until their evaluation is complete.     [name] RAMAN, [place] 09/30/23 1554

## 2023-09-30 NOTE — Discharge Instructions (Addendum)
 Take your antibiotics as prescribed. Keep your foley catheter in and follow up with urology. A referral was placed in the ER. Call them tomorrow to make a follow up appointment to have your catheter removed. Case management will have home health come out to the house for physical therapy and occupational therapy. If you have any worsening symptoms return to the ER.

## 2023-09-30 NOTE — ED Provider Notes (Signed)
  EMERGENCY DEPARTMENT AT [facility] Provider Note   CSN: [id number] Arrival date & time: 09/30/23  1447     Patient presents with: Weakness   [name] is a 63 y.o. male.   63 year old non-English-speaking male was brought in by EMS.  Per triage note patient had a neighbor called 911 but no one can figure out why he was here.  We were able to obtain an interpreter who spoke his home language and patient is here for left foot wound.  Interpreter knows the patient well and does state that he has a history of TBI and usually is with his brother.  Patient states that his brother left earlier today.  Patient has no other symptoms or concerns at this time.  Patient states he does not know when he obtained a foot wound or what he stepped on.   Weakness Associated symptoms: no abdominal pain, no arthralgias, no chest pain, no cough, no dysuria, no fever, no seizures, no shortness of breath and no vomiting        Prior to Admission medications   Medication Sig Start Date End Date Taking? Authorizing Provider  cephALEXin  (KEFLEX ) 500 MG capsule Take 1 capsule (500 mg total) by mouth 4 (four) times daily for 7 days. 09/30/23 10/07/23 Yes Perl Kerney L, DO  cloBAZam  (ONFI ) 10 MG tablet Take 1 tablet (10 mg total) by mouth at bedtime. 09/08/23   Gayland Lauraine PARAS, NP  cyclobenzaprine  (FLEXERIL ) 10 MG tablet Take 1 tablet (10 mg total) by mouth 2 (two) times daily as needed for muscle spasms. Patient not taking: Reported on 09/08/2023 02/13/23   Jerrol Agent, MD  ergocalciferol  (VITAMIN D2) 1.25 MG (50000 UT) capsule Take 50,000 Units by mouth once a week. Patient not taking: Reported on 09/08/2023    [provider]  FLUoxetine  (PROZAC ) 40 MG capsule Take 40 mg by mouth daily.    [provider]  gabapentin  (NEURONTIN ) 300 MG capsule Take 1 capsule (300 mg total) by mouth 3 (three) times daily as needed. 02/16/23   Onita Duos, MD  lamoTRIgine  (LAMICTAL ) 100 MG  tablet Take 3 tablets (300 mg total) by mouth 2 (two) times daily. 09/08/23   Gayland Lauraine PARAS, NP  levETIRAcetam  (KEPPRA ) 1000 MG tablet Take 2 tablets (2,000 mg total) by mouth 2 (two) times daily. 09/08/23   Gayland Lauraine PARAS, NP  lidocaine  (LIDODERM ) 5 % Place 1 patch onto the skin daily. Remove & Discard patch within 12 hours or as directed by MD Patient not taking: Reported on 09/08/2023 02/13/23   Jerrol Agent, MD  LINZESS 72 MCG capsule Take 72 mcg by mouth every morning. Patient not taking: Reported on 09/08/2023 01/19/23   [provider]  tamsulosin  (FLOMAX ) 0.4 MG CAPS capsule Take 0.4 mg by mouth.    [provider]    Allergies: Patient has no known allergies.    Review of Systems  Constitutional:  Negative for chills and fever.  HENT:  Negative for ear pain and sore throat.   Eyes:  Negative for pain and visual disturbance.  Respiratory:  Negative for cough and shortness of breath.   Cardiovascular:  Negative for chest pain and palpitations.  Gastrointestinal:  Negative for abdominal pain and vomiting.  Genitourinary:  Negative for dysuria and hematuria.  Musculoskeletal:  Negative for arthralgias and back pain.       Admits left foot wound  Skin:  Negative for color change and rash.  Neurological:  Negative for  seizures and syncope.  All other systems reviewed and are negative.   Updated Vital Signs BP (!) 146/77   Pulse 66   Temp 97.6 F (36.4 C) (Oral)   Resp 17   SpO2 100%   Physical Exam Vitals and nursing note reviewed.  Constitutional:      General: He is not in acute distress.    Appearance: Normal appearance. He is well-developed. He is not ill-appearing.  HENT:     Head: Normocephalic and atraumatic.  Eyes:     Conjunctiva/sclera: Conjunctivae normal.  Cardiovascular:     Rate and Rhythm: Normal rate and regular rhythm.     Heart sounds: No murmur heard. Pulmonary:     Effort: Pulmonary effort is normal. No respiratory distress.      Breath sounds: Normal breath sounds.  Abdominal:     Palpations: Abdomen is soft.     Tenderness: There is no abdominal tenderness.  Musculoskeletal:        General: No swelling.     Cervical back: Neck supple.     Comments: Left plantar surface wound in the center of the foot, appears to be a puncture wound, surrounding erythema and some scant purulent drainage   Skin:    General: Skin is warm and dry.     Capillary Refill: Capillary refill takes less than 2 seconds.  Neurological:     Mental Status: He is alert.  Psychiatric:        Mood and Affect: Mood normal.     (all labs ordered are listed, but only abnormal results are displayed) Labs Reviewed  CBC WITH DIFFERENTIAL/PLATELET - Abnormal; Notable for the following components:      Result Value   Hemoglobin 12.9 (*)    All other components within normal limits  COMPREHENSIVE METABOLIC PANEL WITH GFR - Abnormal; Notable for the following components:   Potassium 3.1 (*)    Glucose, Bld 101 (*)    Creatinine, Ser 1.51 (*)    GFR, Estimated 51 (*)    All other components within normal limits  URINALYSIS, W/ REFLEX TO CULTURE (INFECTION SUSPECTED) - Abnormal; Notable for the following components:   Hgb urine dipstick SMALL (*)    All other components within normal limits  CBG MONITORING, ED - Abnormal; Notable for the following components:   Glucose-Capillary 112 (*)    All other components within normal limits    EKG: None  Radiology: DG Foot 2 Views Left Result Date: 09/30/2023 CLINICAL DATA:  Puncture wound to the bottom of the foot. EXAM: LEFT FOOT - 2 VIEW COMPARISON:  None Available. FINDINGS: There is no evidence of fracture or dislocation. There is no evidence of arthropathy or other focal bone abnormality. Soft tissues are unremarkable. No foreign body identified. IMPRESSION: Negative. Electronically Signed   By: Greig Pique M.D.   On: 09/30/2023 20:24     Procedures   Medications Ordered in the ED   cephALEXin  (KEFLEX ) capsule 500 mg (500 mg Oral Given 09/30/23 2050)                                    Medical Decision Making Patient's brother arrived at bedside and states that patient has had difficulty peeing lately as well as become more weak and had difficulty ambulating.  He states that home health was out to the house about 6 months ago and patient did well with those therapies.  Case management was consulted and they will plan to restart home health PT and OT.  Patient had a bladder scan done here and had 400 cc of urine in his bladder.  We placed a Foley catheter and patient will be discharged home with a Foley catheter and urology referral.  Brother at bedside advised to call urology to make a follow-up appointment and return for any new or worsening symptoms.  I will start patient on Keflex  for his foot infection as well.  They advised to follow-up with primary care and otherwise return to the ER for new or worsening symptoms.  Brother feels comfortable being discharged home with the patient.  Problems Addressed: Acute urinary retention: acute illness or injury Difficulty walking: chronic illness or injury Foot infection: acute illness or injury  Amount and/or Complexity of Data Reviewed Independent Historian: caregiver    Details: Patient's brother at bedside helps provide history that patient has also had some acute urinary retention and picks at his foot quite frequently External Data Reviewed: notes.    Details: Prior outpatient records reviewed and patient has a history of TBI Labs: ordered. Decision-making details documented in ED Course.    Details: Ordered and reviewed and unremarkable, no leukocytosis and no evidence of UTI Radiology: ordered and independent interpretation performed. Decision-making details documented in ED Course.    Details: Ordered and interpreted by me independently of radiology Left foot x-ray: Shows no acute abnormality ECG/medicine tests:  ordered and independent interpretation performed. Decision-making details documented in ED Course.    Details: Ordered and interpreted by me in the absence of cardiology show sinus rhythm, no STEMI, normal intervals and no acute change when compared to prior EKG  Risk OTC drugs. Prescription drug management. Diagnosis or treatment significantly limited by social determinants of health. Risk Details: Social determinants of health: Patient is not English-speaking, lives with his brother who also English is not his first language and patient has poor follow-up.  Also has a history of TBI.  Was discharged with home health resources but high risk of return to the ER     Final diagnoses:  Foot infection  Acute urinary retention  Difficulty walking    ED Discharge Orders          Ordered    cephALEXin  (KEFLEX ) 500 MG capsule  4 times daily        09/30/23 2039               Gennaro Duwaine CROME, DO 09/30/23 2323

## 2023-09-30 NOTE — ED Triage Notes (Signed)
 [facility] unable to get accurate complaint from pt, pt went to neighbors house and had them call 911. Upon arrival interpretor used and unable to communicate with pt, pt just kept giving numbers on cards in his wallet, attempted to call numbers with no answers. Asked pt if he was hurt, does not answer. Pt not answering interpreters questions.

## 2023-09-30 NOTE — ED Notes (Signed)
 Interpreter contacted. Patient here due to left foot pain. Patient denies any trauma to foot.

## 2023-10-01 NOTE — Congregational Nurse Program (Signed)
 Home visit following phone call from patient's brother [name].  He stated patient left home yesterday while he was at work, fell and asked neighbor to call 911.  Patient was seen at ED and determined to have UTI and infected wound on bottom of left foot.  He was prescribed antibiotics and a foley catheter was inserted. They were told to call [facility] and schedule appointment ASAP with Dr. Nieves but unable to do so due to language barrier.  CN called and made appointment for 10/06/2023 @ 8:00 am.  They had not emptied catheter bag and misunderstood that someone was coming today to take it out.  Instructed brother on how to empty it and that it needs to be emptied multiple times a day.  He expressed understanding. Antibiotic had not been picked up from [facility].  CN went to pharmacy and took it to patient explaining instructions to take 1 pill 4 times a day with food until all gone.  Elveria Rummer RN, Congregational Nurse [phone]

## 2023-10-05 ENCOUNTER — Emergency Department (HOSPITAL_COMMUNITY)

## 2023-10-05 ENCOUNTER — Encounter (HOSPITAL_COMMUNITY): Payer: Self-pay | Admitting: Emergency Medicine

## 2023-10-05 ENCOUNTER — Inpatient Hospital Stay (HOSPITAL_COMMUNITY)
Admission: EM | Admit: 2023-10-05 | Discharge: 2023-10-11 | DRG: 698 | Discharge status: Home Health | Attending: Internal Medicine | Admitting: Internal Medicine

## 2023-10-05 ENCOUNTER — Other Ambulatory Visit: Payer: Self-pay

## 2023-10-05 DIAGNOSIS — Z87820 Personal history of traumatic brain injury: Secondary | ICD-10-CM

## 2023-10-05 DIAGNOSIS — K5900 Constipation, unspecified: Secondary | ICD-10-CM | POA: Diagnosis present

## 2023-10-05 DIAGNOSIS — E876 Hypokalemia: Secondary | ICD-10-CM | POA: Diagnosis present

## 2023-10-05 DIAGNOSIS — Z87891 Personal history of nicotine dependence: Secondary | ICD-10-CM

## 2023-10-05 DIAGNOSIS — G40909 Epilepsy, unspecified, not intractable, without status epilepticus: Secondary | ICD-10-CM | POA: Diagnosis present

## 2023-10-05 DIAGNOSIS — M5146 Schmorl's nodes, lumbar region: Secondary | ICD-10-CM | POA: Diagnosis not present

## 2023-10-05 DIAGNOSIS — Z9181 History of falling: Secondary | ICD-10-CM | POA: Diagnosis not present

## 2023-10-05 DIAGNOSIS — A4152 Sepsis due to Pseudomonas: Secondary | ICD-10-CM | POA: Diagnosis present

## 2023-10-05 DIAGNOSIS — M4856XA Collapsed vertebra, not elsewhere classified, lumbar region, initial encounter for fracture: Secondary | ICD-10-CM | POA: Diagnosis present

## 2023-10-05 DIAGNOSIS — N3001 Acute cystitis with hematuria: Principal | ICD-10-CM

## 2023-10-05 DIAGNOSIS — I129 Hypertensive chronic kidney disease with stage 1 through stage 4 chronic kidney disease, or unspecified chronic kidney disease: Secondary | ICD-10-CM | POA: Diagnosis present

## 2023-10-05 DIAGNOSIS — E872 Acidosis, unspecified: Secondary | ICD-10-CM | POA: Diagnosis not present

## 2023-10-05 DIAGNOSIS — N390 Urinary tract infection, site not specified: Secondary | ICD-10-CM | POA: Insufficient documentation

## 2023-10-05 DIAGNOSIS — A419 Sepsis, unspecified organism: Secondary | ICD-10-CM | POA: Diagnosis present

## 2023-10-05 DIAGNOSIS — Z79899 Other long term (current) drug therapy: Secondary | ICD-10-CM

## 2023-10-05 DIAGNOSIS — N1831 Chronic kidney disease, stage 3a: Secondary | ICD-10-CM | POA: Diagnosis present

## 2023-10-05 DIAGNOSIS — S32018A Other fracture of first lumbar vertebra, initial encounter for closed fracture: Secondary | ICD-10-CM | POA: Diagnosis not present

## 2023-10-05 DIAGNOSIS — Z6831 Body mass index (BMI) 31.0-31.9, adult: Secondary | ICD-10-CM

## 2023-10-05 DIAGNOSIS — F419 Anxiety disorder, unspecified: Secondary | ICD-10-CM | POA: Diagnosis present

## 2023-10-05 DIAGNOSIS — R531 Weakness: Secondary | ICD-10-CM | POA: Diagnosis not present

## 2023-10-05 DIAGNOSIS — T83511A Infection and inflammatory reaction due to indwelling urethral catheter, initial encounter: Secondary | ICD-10-CM | POA: Diagnosis present

## 2023-10-05 DIAGNOSIS — Z66 Do not resuscitate: Secondary | ICD-10-CM | POA: Diagnosis present

## 2023-10-05 DIAGNOSIS — R6520 Severe sepsis without septic shock: Secondary | ICD-10-CM | POA: Diagnosis present

## 2023-10-05 DIAGNOSIS — N400 Enlarged prostate without lower urinary tract symptoms: Secondary | ICD-10-CM | POA: Diagnosis present

## 2023-10-05 DIAGNOSIS — E66811 Obesity, class 1: Secondary | ICD-10-CM | POA: Diagnosis present

## 2023-10-05 DIAGNOSIS — F32A Depression, unspecified: Secondary | ICD-10-CM | POA: Diagnosis present

## 2023-10-05 DIAGNOSIS — Y738 Miscellaneous gastroenterology and urology devices associated with adverse incidents, not elsewhere classified: Secondary | ICD-10-CM | POA: Diagnosis present

## 2023-10-05 LAB — URINALYSIS, W/ REFLEX TO CULTURE (INFECTION SUSPECTED)
Bacteria, UA: NONE SEEN
Bilirubin Urine: NEGATIVE
Glucose, UA: NEGATIVE mg/dL
Ketones, ur: NEGATIVE mg/dL
Nitrite: POSITIVE — AB
Protein, ur: 100 mg/dL — AB
RBC / HPF: >50 RBC/hpf (ref 0–5)
Specific Gravity, Urine: 1.02 (ref 1.005–1.030)
WBC, UA: >50 WBC/hpf (ref 0–5)
pH: 5 (ref 5.0–8.0)

## 2023-10-05 LAB — I-STAT CG4 LACTIC ACID, ED
Lactic Acid, Venous: 2.1 mmol/L (ref 0.5–1.9)
Lactic Acid, Venous: 1 mmol/L (ref 0.5–1.9)

## 2023-10-05 LAB — COMPREHENSIVE METABOLIC PANEL WITH GFR
ALT: 10 U/L (ref 0–44)
AST: 22 U/L (ref 15–41)
Albumin: 3.1 g/dL — ABNORMAL LOW (ref 3.5–5.0)
Alkaline Phosphatase: 76 U/L (ref 38–126)
Anion gap: 9 (ref 5–15)
BUN: 17 mg/dL (ref 8–23)
CO2: 25 mmol/L (ref 22–32)
Calcium: 8 mg/dL — ABNORMAL LOW (ref 8.9–10.3)
Chloride: 103 mmol/L (ref 98–111)
Creatinine, Ser: 1.76 mg/dL — ABNORMAL HIGH (ref 0.61–1.24)
GFR, Estimated: 42 mL/min — ABNORMAL LOW (ref >60)
Glucose, Bld: 121 mg/dL — ABNORMAL HIGH (ref 70–99)
Potassium: 3.3 mmol/L — ABNORMAL LOW (ref 3.5–5.1)
Sodium: 137 mmol/L (ref 135–145)
Total Bilirubin: 0.7 mg/dL (ref 0.0–1.2)
Total Protein: 6.8 g/dL (ref 6.5–8.1)

## 2023-10-05 LAB — CBC WITH DIFFERENTIAL/PLATELET
Abs Immature Granulocytes: 0.28 K/uL — ABNORMAL HIGH (ref 0.00–0.07)
Basophils Absolute: 0.1 K/uL (ref 0.0–0.1)
Basophils Relative: 0 %
Eosinophils Absolute: 0 K/uL (ref 0.0–0.5)
Eosinophils Relative: 0 %
HCT: 40 % (ref 39.0–52.0)
Hemoglobin: 12.8 g/dL — ABNORMAL LOW (ref 13.0–17.0)
Immature Granulocytes: 1 %
Lymphocytes Relative: 4 %
Lymphs Abs: 1 K/uL (ref 0.7–4.0)
MCH: 29.4 pg (ref 26.0–34.0)
MCHC: 32 g/dL (ref 30.0–36.0)
MCV: 91.7 fL (ref 80.0–100.0)
Monocytes Absolute: 2 K/uL — ABNORMAL HIGH (ref 0.1–1.0)
Monocytes Relative: 9 %
Neutro Abs: 20.2 K/uL — ABNORMAL HIGH (ref 1.7–7.7)
Neutrophils Relative %: 86 %
Platelets: 273 K/uL (ref 150–400)
RBC: 4.36 MIL/uL (ref 4.22–5.81)
RDW: 14.2 % (ref 11.5–15.5)
WBC: 23.6 K/uL — ABNORMAL HIGH (ref 4.0–10.5)
nRBC: 0 % (ref 0.0–0.2)

## 2023-10-05 LAB — PROTIME-INR
INR: 1.1 (ref 0.8–1.2)
Prothrombin Time: 15.1 s (ref 11.4–15.2)

## 2023-10-05 LAB — I-STAT CHEM 8, ED
BUN: 16 mg/dL (ref 8–23)
Calcium, Ion: 1.04 mmol/L — ABNORMAL LOW (ref 1.15–1.40)
Chloride: 102 mmol/L (ref 98–111)
Creatinine, Ser: 1.7 mg/dL — ABNORMAL HIGH (ref 0.61–1.24)
Glucose, Bld: 121 mg/dL — ABNORMAL HIGH (ref 70–99)
HCT: 38 % — ABNORMAL LOW (ref 39.0–52.0)
Hemoglobin: 12.9 g/dL — ABNORMAL LOW (ref 13.0–17.0)
Potassium: 3.3 mmol/L — ABNORMAL LOW (ref 3.5–5.1)
Sodium: 138 mmol/L (ref 135–145)
TCO2: 25 mmol/L (ref 22–32)

## 2023-10-05 MED ORDER — ONDANSETRON HCL 4 MG/2ML IJ SOLN: 4.0000 mg | Freq: Four times a day (QID) | INTRAMUSCULAR | Status: DC | PRN

## 2023-10-05 MED ORDER — CHLORHEXIDINE GLUCONATE CLOTH 2 % EX PADS
6.0000 | MEDICATED_PAD | Freq: Every day | CUTANEOUS | Status: DC
  Administered 2023-10-06 – 2023-10-11 (×7): 6 via TOPICAL

## 2023-10-05 MED ORDER — GABAPENTIN 300 MG PO CAPS: 300.0000 mg | ORAL_CAPSULE | Freq: Three times a day (TID) | ORAL | Status: DC | PRN

## 2023-10-05 MED ORDER — TAMSULOSIN HCL 0.4 MG PO CAPS
0.4000 mg | ORAL_CAPSULE | Freq: Every day | ORAL | Status: DC
  Administered 2023-10-06 – 2023-10-10 (×5): 0.4 mg via ORAL
  Filled 2023-10-05 (×5): qty 1

## 2023-10-05 MED ORDER — SODIUM CHLORIDE 0.9 % IV SOLN
2.0000 g | Freq: Once | INTRAVENOUS | Status: AC
  Administered 2023-10-05: 2 g via INTRAVENOUS
  Filled 2023-10-05: qty 20

## 2023-10-05 MED ORDER — SODIUM CHLORIDE 0.9 % IV SOLN
2.0000 g | INTRAVENOUS | Status: DC
  Administered 2023-10-06: 2 g via INTRAVENOUS
  Filled 2023-10-05: qty 20

## 2023-10-05 MED ORDER — POLYETHYLENE GLYCOL 3350 17 G PO PACK: 17.0000 g | PACK | Freq: Every day | ORAL | Status: DC | PRN

## 2023-10-05 MED ORDER — ACETAMINOPHEN 500 MG PO TABS
1000.0000 mg | ORAL_TABLET | Freq: Once | ORAL | Status: AC
  Administered 2023-10-05: 1000 mg via ORAL
  Filled 2023-10-05: qty 2

## 2023-10-05 MED ORDER — SODIUM CHLORIDE 0.9% FLUSH
3.0000 mL | Freq: Two times a day (BID) | INTRAVENOUS | Status: DC
  Administered 2023-10-05 – 2023-10-11 (×11): 3 mL via INTRAVENOUS

## 2023-10-05 MED ORDER — ACETAMINOPHEN 500 MG PO TABS
1000.0000 mg | ORAL_TABLET | Freq: Four times a day (QID) | ORAL | Status: DC | PRN
  Administered 2023-10-06: 1000 mg via ORAL
  Filled 2023-10-05: qty 2

## 2023-10-05 MED ORDER — LEVETIRACETAM 500 MG PO TABS
2000.0000 mg | ORAL_TABLET | Freq: Two times a day (BID) | ORAL | Status: DC
  Administered 2023-10-05 – 2023-10-11 (×12): 2000 mg via ORAL
  Filled 2023-10-05 (×12): qty 4

## 2023-10-05 MED ORDER — LAMOTRIGINE 100 MG PO TABS
300.0000 mg | ORAL_TABLET | Freq: Two times a day (BID) | ORAL | Status: DC
  Administered 2023-10-05 – 2023-10-11 (×12): 300 mg via ORAL
  Filled 2023-10-05 (×11): qty 3
  Filled 2023-10-05: qty 12

## 2023-10-05 MED ORDER — POTASSIUM CHLORIDE CRYS ER 20 MEQ PO TBCR
40.0000 meq | EXTENDED_RELEASE_TABLET | Freq: Once | ORAL | Status: AC
  Administered 2023-10-05: 40 meq via ORAL
  Filled 2023-10-05: qty 2

## 2023-10-05 MED ORDER — IPRATROPIUM-ALBUTEROL 0.5-2.5 (3) MG/3ML IN SOLN
3.0000 mL | Freq: Four times a day (QID) | RESPIRATORY_TRACT | Status: DC
  Administered 2023-10-06: 3 mL via RESPIRATORY_TRACT
  Filled 2023-10-05: qty 3

## 2023-10-05 MED ORDER — FLUOXETINE HCL 20 MG PO CAPS
40.0000 mg | ORAL_CAPSULE | Freq: Every day | ORAL | Status: DC
  Administered 2023-10-06 – 2023-10-11 (×6): 40 mg via ORAL
  Filled 2023-10-05 (×6): qty 2

## 2023-10-05 MED ORDER — ALBUTEROL SULFATE (2.5 MG/3ML) 0.083% IN NEBU: 2.5000 mg | INHALATION_SOLUTION | RESPIRATORY_TRACT | Status: DC | PRN

## 2023-10-05 MED ORDER — MELATONIN 3 MG PO TABS
6.0000 mg | ORAL_TABLET | Freq: Every evening | ORAL | Status: DC | PRN
  Administered 2023-10-06 – 2023-10-09 (×4): 6 mg via ORAL
  Filled 2023-10-05 (×4): qty 2

## 2023-10-05 MED ORDER — SODIUM CHLORIDE 0.9 % IV BOLUS
1000.0000 mL | Freq: Once | INTRAVENOUS | Status: AC
  Administered 2023-10-05: 1000 mL via INTRAVENOUS

## 2023-10-05 NOTE — ED Provider Notes (Signed)
 [place] EMERGENCY DEPARTMENT AT [facility] Provider Note   CSN: [id number] Arrival date & time: 10/05/23  1636     Patient presents with: Altered Mental Status   [name] is a 63 y.o. male.   63 yo M with a chief complaints of altered mental status.  Patient was recently hospitalized for an infected foot.  Per EMS family did not know how to empty the Foley catheter bag and it had remained full for about a week.  Home health nursing aide come out and show them how to remove the urine.  Patient unfortunately is confused and unable to provide any history.  Level 5 caveat.   Altered Mental Status      Prior to Admission medications   Medication Sig Start Date End Date Taking? Authorizing Provider  cephALEXin  (KEFLEX ) 500 MG capsule Take 1 capsule (500 mg total) by mouth 4 (four) times daily for 7 days. 09/30/23 10/07/23  Kammerer, Megan L, DO  cloBAZam  (ONFI ) 10 MG tablet Take 1 tablet (10 mg total) by mouth at bedtime. 09/08/23   Gayland Lauraine PARAS, NP  cyclobenzaprine  (FLEXERIL ) 10 MG tablet Take 1 tablet (10 mg total) by mouth 2 (two) times daily as needed for muscle spasms. Patient not taking: Reported on 09/08/2023 02/13/23   Jerrol Agent, MD  ergocalciferol  (VITAMIN D2) 1.25 MG (50000 UT) capsule Take 50,000 Units by mouth once a week. Patient not taking: Reported on 09/08/2023    [provider]  FLUoxetine  (PROZAC ) 40 MG capsule Take 40 mg by mouth daily.    [provider]  gabapentin  (NEURONTIN ) 300 MG capsule Take 1 capsule (300 mg total) by mouth 3 (three) times daily as needed. 02/16/23   Onita Duos, MD  lamoTRIgine  (LAMICTAL ) 100 MG tablet Take 3 tablets (300 mg total) by mouth 2 (two) times daily. 09/08/23   Gayland Lauraine PARAS, NP  levETIRAcetam  (KEPPRA ) 1000 MG tablet Take 2 tablets (2,000 mg total) by mouth 2 (two) times daily. 09/08/23   Gayland Lauraine PARAS, NP  lidocaine  (LIDODERM ) 5 % Place 1 patch onto the skin daily. Remove & Discard patch within 12  hours or as directed by MD Patient not taking: Reported on 09/08/2023 02/13/23   Jerrol Agent, MD  LINZESS 72 MCG capsule Take 72 mcg by mouth every morning. Patient not taking: Reported on 09/08/2023 01/19/23   [provider]  tamsulosin  (FLOMAX ) 0.4 MG CAPS capsule Take 0.4 mg by mouth.    [provider]    Allergies: Patient has no known allergies.    Review of Systems  Updated Vital Signs BP 131/78   Pulse 83   Temp 99.7 F (37.6 C) (Oral)   Resp 20   Ht 5' 5 (1.651 m)   Wt 86.2 kg   SpO2 99%   BMI 31.62 kg/m   Physical Exam Vitals and nursing note reviewed.  Constitutional:      Appearance: He is well-developed.  HENT:     Head: Normocephalic and atraumatic.  Eyes:     Pupils: Pupils are equal, round, and reactive to light.  Neck:     Vascular: No JVD.  Cardiovascular:     Rate and Rhythm: Normal rate and regular rhythm.     Heart sounds: No murmur heard.    No friction rub. No gallop.  Pulmonary:     Effort: No respiratory distress.     Breath sounds: No wheezing.  Abdominal:     General: There is no distension.  Tenderness: There is no abdominal tenderness. There is no guarding or rebound.  Musculoskeletal:        General: Normal range of motion.     Cervical back: Normal range of motion and neck supple.     Comments: Wound to the plantar aspect of the left foot.  Scabbed over.  No surrounding erythema drainage or fluctuance.  Skin:    Coloration: Skin is not pale.     Findings: No rash.  Neurological:     Mental Status: He is alert and oriented to person, place, and time.  Psychiatric:        Behavior: Behavior normal.     (all labs ordered are listed, but only abnormal results are displayed) Labs Reviewed  CBC WITH DIFFERENTIAL/PLATELET - Abnormal; Notable for the following components:      Result Value   WBC 23.6 (*)    Hemoglobin 12.8 (*)    Neutro Abs 20.2 (*)    Monocytes Absolute 2.0 (*)    Abs Immature Granulocytes  0.28 (*)    All other components within normal limits  URINALYSIS, W/ REFLEX TO CULTURE (INFECTION SUSPECTED) - Abnormal; Notable for the following components:   APPearance CLOUDY (*)    Hgb urine dipstick MODERATE (*)    Protein, ur 100 (*)    Nitrite POSITIVE (*)    Leukocytes,Ua LARGE (*)    Non Squamous Epithelial 0-5 (*)    All other components within normal limits  COMPREHENSIVE METABOLIC PANEL WITH GFR - Abnormal; Notable for the following components:   Potassium 3.3 (*)    Glucose, Bld 121 (*)    Creatinine, Ser 1.76 (*)    Calcium 8.0 (*)    Albumin 3.1 (*)    GFR, Estimated 42 (*)    All other components within normal limits  I-STAT CG4 LACTIC ACID, ED - Abnormal; Notable for the following components:   Lactic Acid, Venous 2.1 (*)    All other components within normal limits  I-STAT CHEM 8, ED - Abnormal; Notable for the following components:   Potassium 3.3 (*)    Creatinine, Ser 1.70 (*)    Glucose, Bld 121 (*)    Calcium, Ion 1.04 (*)    Hemoglobin 12.9 (*)    HCT 38.0 (*)    All other components within normal limits  CULTURE, BLOOD (ROUTINE X 2)  CULTURE, BLOOD (ROUTINE X 2)  URINE CULTURE  PROTIME-INR  HIV ANTIBODY (ROUTINE TESTING W REFLEX)  BASIC METABOLIC PANEL WITH GFR  CBC  MAGNESIUM   PHOSPHORUS  TSH  I-STAT CG4 LACTIC ACID, ED    EKG: EKG Interpretation Date/Time:  Tuesday October 05 2023 17:28:54 EDT Ventricular Rate:  89 PR Interval:  38 QRS Duration:  104 QT Interval:  363 QTC Calculation: 442 R Axis:   56  Text Interpretation: Sinus rhythm Short PR interval Consider right atrial enlargement Consider right ventricular hypertrophy Borderline ST elevation, anterior leads Baseline wander TECHNICALLY DIFFICULT Otherwise no significant change Confirmed by Emil Share [phone]) on 10/05/2023 5:46:08 PM  Radiology: CT Head Wo Contrast Result Date: 10/05/2023 EXAM: CT HEAD WITHOUT CONTRAST 10/05/2023 06:55:07 PM TECHNIQUE: CT of the head was performed  without the administration of intravenous contrast. Automated exposure control, iterative reconstruction, and/or weight based adjustment of the mA/kV was utilized to reduce the radiation dose to as low as reasonably achievable. COMPARISON: Comparison with CT head 09/26/2023. CLINICAL HISTORY: Delirium. Pt coming from home w/ c/o AMS and possible sepsis. Pt was septic 1 week  ago and was dc from hospital w/ foley catheter. Family did not know how to empty foley bag so bag had not been emptied x 1 week. Pt more confused than normal. FINDINGS: BRAIN AND VENTRICLES: Chronic microvascular ischemia and generalized atrophy. 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 No evidence of acute infarct. No intracranial hemorrhage or mass effect. No extraaxial fluid collection. Stable ventricular caliber. ORBITS: No acute abnormality. SINUSES: Mucosal thickening in the ethmoid air cells similar to prior. SOFT TISSUES AND SKULL: No acute soft tissue abnormality. No skull fracture. IMPRESSION: 1. No acute intracranial abnormality. Electronically signed by: Norman Gatlin MD 10/05/2023 07:42 PM EDT RP Workstation: [id number]   DG Chest Port 1 View Result Date: 10/05/2023 CLINICAL DATA:  Questionable sepsis. EXAM: PORTABLE CHEST 1 VIEW COMPARISON:  Chest radiograph dated 03/04/2021. FINDINGS: Minimal left lung base atelectasis. No focal consolidation, pleural effusion, or pneumothorax. The cardiac silhouette is within normal limits. No acute osseous pathology. IMPRESSION: No active disease. Electronically Signed   By: Vanetta Chou M.D.   On: 10/05/2023 18:08     .Critical Care  Performed by: Emil Share, DO Authorized by: Emil Share, DO   Critical care provider statement:    Critical care time (minutes):  35   Critical care time was exclusive of:  Separately billable procedures and treating other patients   Critical care was time spent personally by me on the following activities:  Development of  treatment plan with patient or surrogate, discussions with consultants, evaluation of patient's response to treatment, examination of patient, ordering and review of laboratory studies, ordering and review of radiographic studies, ordering and performing treatments and interventions, pulse oximetry, re-evaluation of patient's condition and review of old charts   Care discussed with: admitting provider      Medications Ordered in the ED  cefTRIAXone  (ROCEPHIN ) 2 g in sodium chloride  0.9 % 100 mL IVPB (has no administration in time range)  FLUoxetine  (PROZAC ) capsule 40 mg (has no administration in time range)  tamsulosin  (FLOMAX ) capsule 0.4 mg (has no administration in time range)  gabapentin  (NEURONTIN ) capsule 300 mg (has no administration in time range)  lamoTRIgine  (LAMICTAL ) tablet 300 mg (has no administration in time range)  levETIRAcetam  (KEPPRA ) tablet 2,000 mg (has no administration in time range)  sodium chloride  flush (NS) 0.9 % injection 3 mL (has no administration in time range)  acetaminophen  (TYLENOL ) tablet 1,000 mg (has no administration in time range)  albuterol  (PROVENTIL ) (2.5 MG/3ML) 0.083% nebulizer solution 2.5 mg (has no administration in time range)  melatonin tablet 6 mg (has no administration in time range)  ondansetron  (ZOFRAN ) injection 4 mg (has no administration in time range)  polyethylene glycol (MIRALAX  / GLYCOLAX ) packet 17 g (has no administration in time range)  potassium chloride  SA (KLOR-CON  M) CR tablet 40 mEq (has no administration in time range)  sodium chloride  0.9 % bolus 1,000 mL (0 mLs Intravenous Stopped 10/05/23 2009)  acetaminophen  (TYLENOL ) tablet 1,000 mg (1,000 mg Oral Given 10/05/23 1753)  cefTRIAXone  (ROCEPHIN ) 2 g in sodium chloride  0.9 % 100 mL IVPB (0 g Intravenous Stopped 10/05/23 2159)                                    Medical Decision Making Amount and/or Complexity of Data Reviewed Labs: ordered. Radiology: ordered. ECG/medicine  tests: ordered.  Risk OTC drugs. Decision regarding hospitalization.   63 yo M with a  chief complaint of altered mental status.  Patient arrived via EMS.  Per their report there is some concern for infection.  Borderline febrile with them en route.  Was recently hospitalized for left foot infection.  Plan for labs, CT, cxr, ua.  Reasess.   I was contacted by the individual that had contacted 911.  One of the nurses working in our system.  Provided further history.  Tells me that he was found to be very tired and had a temperature of 102.8.  EMS was then called.  No obvious known confusion though it does not sound like they had spoken with him but had spoken to his brother.  Does not sound like any family member is planning on coming soon but might be here tomorrow.  I contacted the [facility]  interpreter on call.  UA concerning for urinary tract infection.  Will start on antibiotics.  Discussed with medicine for admission.  The patients results and plan were reviewed and discussed.   Any x-rays performed were independently reviewed by myself.   Differential diagnosis were considered with the presenting HPI.  Medications  cefTRIAXone  (ROCEPHIN ) 2 g in sodium chloride  0.9 % 100 mL IVPB (has no administration in time range)  FLUoxetine  (PROZAC ) capsule 40 mg (has no administration in time range)  tamsulosin  (FLOMAX ) capsule 0.4 mg (has no administration in time range)  gabapentin  (NEURONTIN ) capsule 300 mg (has no administration in time range)  lamoTRIgine  (LAMICTAL ) tablet 300 mg (has no administration in time range)  levETIRAcetam  (KEPPRA ) tablet 2,000 mg (has no administration in time range)  sodium chloride  flush (NS) 0.9 % injection 3 mL (has no administration in time range)  acetaminophen  (TYLENOL ) tablet 1,000 mg (has no administration in time range)  albuterol  (PROVENTIL ) (2.5 MG/3ML) 0.083% nebulizer solution 2.5 mg (has no administration in time range)  melatonin tablet 6 mg  (has no administration in time range)  ondansetron  (ZOFRAN ) injection 4 mg (has no administration in time range)  polyethylene glycol (MIRALAX  / GLYCOLAX ) packet 17 g (has no administration in time range)  potassium chloride  SA (KLOR-CON  M) CR tablet 40 mEq (has no administration in time range)  sodium chloride  0.9 % bolus 1,000 mL (0 mLs Intravenous Stopped 10/05/23 2009)  acetaminophen  (TYLENOL ) tablet 1,000 mg (1,000 mg Oral Given 10/05/23 1753)  cefTRIAXone  (ROCEPHIN ) 2 g in sodium chloride  0.9 % 100 mL IVPB (0 g Intravenous Stopped 10/05/23 2159)    Vitals:   10/05/23 1956 10/05/23 1958 10/05/23 2000 10/05/23 2030  BP:  127/69 127/69 131/78  Pulse:  80 82 83  Resp:  (!) 23 (!) 23 20  Temp: 99.7 F (37.6 C)     TempSrc: Oral     SpO2:  95% 96% 99%  Weight:      Height:        Final diagnoses:  Acute cystitis with hematuria    Admission/ observation were discussed with the admitting physician, patient and/or family and they are comfortable with the plan.       Final diagnoses:  Acute cystitis with hematuria    ED Discharge Orders     None          Emil Share, DO 10/05/23 2214

## 2023-10-05 NOTE — ED Notes (Signed)
 Attempted to use vietnamese interpreter w/ pt. Unable to understand each other via interpreter.

## 2023-10-05 NOTE — H&P (Addendum)
 History and Physical    [name] FMW:[id number] DOB: 1960-07-24 DOA: 10/05/2023  PCP: Shelda Atlas, MD   Patient coming from: Home   Chief Complaint:  Chief Complaint  Patient presents with   Altered Mental Status    HPI: [name] speaking  [name] is a 63 y.o. male, [name] speaking, with hx of TBI, seizure disorder, debility, HTN, BPH, CKD3, recent ED visit 7/27 with fall out of wheelchair, and on 7/31 for L foot wound discharged on Keflex  for cellulitis, also noted to have retention and urinary catheter place. Brought back in for weakness and fever.   Per RN note:  Home visit following phone call from brother y'Phi that he would not be able to get patient to appointment with [facility] tomorrow morning because he he unable to get up from floor where he has been sleeping since last ED visit.  When he tries to walk he become shakey and falls.  He is taking antibiotic (Keflex ) as ordered after last ED visit.  When CN arrived he was very lethargic but did respond.  Temp was 102.8 (temporal), pulse 84 and bounding, BP 120/74 and respirations 84 and labored.  Could not express pain except stated his legs were numb,  Brother has been emptying catheter bag since instructed how  on 10/01/2023 by CN.  We called 911 to have patient re-evaluated due to weakness, fever and labored breathing.  Elveria Rummer RN, Congregational Nurse [phone]   I attempted to call for [facility] translator service, number provided by ED [phone] and sent page but no return call received. I called the hospital tarnslator services and they do not support language [name]. I called and spoke with the patients brother who speaks limited english and is unable to translate. Brother confirms recent hx including foot wound, unable to ambuate due to weakness, and today more somnolent and HH RN concerned for infection. I asked if brother had any contacts that would be able to provide any translation  tonight and he does not.    Review of Systems:  ROS complete and negative except as marked above   No Known Allergies  Prior to Admission medications   Medication Sig Start Date End Date Taking? Authorizing Provider  cephALEXin  (KEFLEX ) 500 MG capsule Take 1 capsule (500 mg total) by mouth 4 (four) times daily for 7 days. 09/30/23 10/07/23  Kammerer, Megan L, DO  cloBAZam  (ONFI ) 10 MG tablet Take 1 tablet (10 mg total) by mouth at bedtime. 09/08/23   Gayland Lauraine PARAS, NP  cyclobenzaprine  (FLEXERIL ) 10 MG tablet Take 1 tablet (10 mg total) by mouth 2 (two) times daily as needed for muscle spasms. Patient not taking: Reported on 09/08/2023 02/13/23   Jerrol Agent, MD  ergocalciferol  (VITAMIN D2) 1.25 MG (50000 UT) capsule Take 50,000 Units by mouth once a week. Patient not taking: Reported on 09/08/2023    [provider]  FLUoxetine  (PROZAC ) 40 MG capsule Take 40 mg by mouth daily.    [provider]  gabapentin  (NEURONTIN ) 300 MG capsule Take 1 capsule (300 mg total) by mouth 3 (three) times daily as needed. 02/16/23   Onita Duos, MD  lamoTRIgine  (LAMICTAL ) 100 MG tablet Take 3 tablets (300 mg total) by mouth 2 (two) times daily. 09/08/23   Gayland Lauraine PARAS, NP  levETIRAcetam  (KEPPRA ) 1000 MG tablet Take 2 tablets (2,000 mg total) by mouth 2 (two) times daily. 09/08/23   Gayland Lauraine PARAS, NP  lidocaine  (LIDODERM ) 5 % Place 1 patch  onto the skin daily. Remove & Discard patch within 12 hours or as directed by MD Patient not taking: Reported on 09/08/2023 02/13/23   Jerrol Agent, MD  LINZESS 72 MCG capsule Take 72 mcg by mouth every morning. Patient not taking: Reported on 09/08/2023 01/19/23   [provider]  tamsulosin  (FLOMAX ) 0.4 MG CAPS capsule Take 0.4 mg by mouth.    [provider]    Past Medical History:  Diagnosis Date   Anxiety    Back pain    low back pain/ compressed disk in lower back.    CKD (chronic kidney disease), stage II    Depression     Hypertension    Language barrier 03-01-13   Speaks very little English,primary-Vietnamese [name] Dialect   Seizures (HCC)    due head trauma-seizures are less, but occurs from mild to more severe, which causes agitation to mental state.   TBI (traumatic [facility]) [facility])    CARE FOR BY BROTHER    Past Surgical History:  Procedure Laterality Date   ABDOMINAL SURGERY     COLONOSCOPY N/A 03/17/2013   Procedure: COLONOSCOPY;  Surgeon: Belvie JONETTA Just, MD;  Location: [facility];  Service: Endoscopy;  Laterality: N/A;   NO PAST SURGERIES     ? one surgery in [place]-? what, has abdominal scar.     reports that he quit smoking about 34 years ago. His smoking use included cigarettes. He has never used smokeless tobacco. He reports that he does not drink alcohol and does not use drugs.  Family History  Problem Relation Age of Onset   Seizures Mother        per his nonbiological brother     Physical Exam: Vitals:   10/05/23 1956 10/05/23 1958 10/05/23 2000 10/05/23 2030  BP:  127/69 127/69 131/78  Pulse:  80 82 83  Resp:  (!) 23 (!) 23 20  Temp: 99.7 F (37.6 C)     TempSrc: Oral     SpO2:  95% 96% 99%  Weight:      Height:        Gen: Awake, alert, NAD   CV: Regular, normal S1, S2, no murmurs  Resp: Normal WOB, diffuse mild wheezing  Abd: Flat, normoactive, mild diffuse tenderness  MSK: Symmetric, no edema  Skin: there is a dry appearing scabbed superficial ~1-1.5 cm ulcer in the Left mid plantar surface. There is a small abrasion anteriorly at the left shin. No surrounding changes of cellulitis. No rashes or lesions to exposed skin  Neuro: Alert and interactive. L eye exotropia. Motor is 5/5 in upper ext and at least antigravity in lower ext (difficulty having him follow commands due to language barrier)  Psych: euthymic, appropriate    Data review:   Labs reviewed, notable for:   K3.3 Creatinine 1.7 (baseline 1.4-1.5) Lactate 1 WBC 23 UA no bacteria but pyuria,  hematuria, leukocytes nitrites, proteinuria.  Micro:  Results for orders placed or performed during the hospital encounter of 09/18/20  Urine Culture     Status: Abnormal   Collection Time: 09/18/20  4:17 PM   Specimen: Urine, Clean Catch  Result Value Ref Range Status   Specimen Description URINE, CLEAN CATCH  Final   Special Requests NONE  Final   Culture (A)  Final    <10,000 COLONIES/mL INSIGNIFICANT GROWTH Performed at [facility], [address] [place], [place] [address]    Report Status 09/19/2020 FINAL  Final    Imaging reviewed:  CT Head Wo Contrast Result Date: 10/05/2023 EXAM: CT HEAD WITHOUT CONTRAST 10/05/2023 06:55:07 PM TECHNIQUE: CT of the head was performed without the administration of intravenous contrast. Automated exposure control, iterative reconstruction, and/or weight based adjustment of the mA/kV was utilized to reduce the radiation dose to as low as reasonably achievable. COMPARISON: Comparison with CT head 09/26/2023. CLINICAL HISTORY: Delirium. Pt coming from home w/ c/o AMS and possible sepsis. Pt was septic 1 week ago and was dc from hospital w/ foley catheter. Family did not know how to empty foley bag so bag had not been emptied x 1 week. Pt more confused than normal. FINDINGS: BRAIN AND VENTRICLES: Chronic microvascular ischemia and generalized atrophy. 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 No evidence of acute infarct. No intracranial hemorrhage or mass effect. No extraaxial fluid collection. Stable ventricular caliber. ORBITS: No acute abnormality. SINUSES: Mucosal thickening in the ethmoid air cells similar to prior. SOFT TISSUES AND SKULL: No acute soft tissue abnormality. No skull fracture. IMPRESSION: 1. No acute intracranial abnormality. Electronically signed by: Norman Gatlin MD 10/05/2023 07:42 PM EDT RP Workstation: [id number]   DG Chest Port 1 View Result Date: 10/05/2023 CLINICAL DATA:  Questionable sepsis. EXAM:  PORTABLE CHEST 1 VIEW COMPARISON:  Chest radiograph dated 03/04/2021. FINDINGS: Minimal left lung base atelectasis. No focal consolidation, pleural effusion, or pneumothorax. The cardiac silhouette is within normal limits. No acute osseous pathology. IMPRESSION: No active disease. Electronically Signed   By: Vanetta Chou M.D.   On: 10/05/2023 18:08   ED Course:  Unable to obtain interpreter speaking patient's dialect.  Treated with ceftriaxone , 1 L NS, Tylenol    Assessment/Plan:  63 y.o. male with hx [name] speaking, with hx of TBI, seizure disorder, debility, HTN, BPH, CKD3, recent ED visit 7/27 with fall out of wheelchair, and on 7/31 for L foot wound discharged on Keflex  for cellulitis, also noted to have retention and urinary catheter place. Brought back in for weakness and fever.   Fever, suspect urinary or skin source  Possible CAUTI, catheter present on admission.  Sepsis, secondary to above, present on admission, improving  Brought in after [facility] noted fever, weakness. At home per RN febrile to 102F, RR mid 20s. WBC 28. Lactate 2.1 -> 1 with IVF. UA no bacteria noted, but otherwise suspicious for infection. Recent treatment for cellulitis with Keflex .  -Continue ceftriaxone  2 g IV 24-hour -Follow-up urine culture, blood culture -S/p 1 L IV fluid, lactate within normal limit continue oral hydration  Generalized weakness -PT/OT evaluation  Hypokalemia Repleted  Language services  -- Difficulty obtaining translator service, ultimately unable at time of my admission. See additional notes in history.  Chronic medical problems: History of TBI: Noted Seizure disorder: Continue home lamotrigine  and Keppra  Hypertension: Not on antihypertensives at time of admission BPH: Continue home Flomax  CKD 3a: Baseline creatinine 1.4-1.5   Body mass index is 31.62 kg/m.  Obesity class I would benefit from weight loss outpatient  DVT prophylaxis:  SCDs Code Status:  Full Code Diet:   Diet Orders (From admission, onward)     Start     Ordered   10/05/23 1654  Diet NPO time specified  (Undifferentiated presentation (screening labs and basic nursing orders))  Diet effective now        10/05/23 1654           Family Communication:  Yes discussed with brother over the phone   Consults:  None   Admission status:   Inpatient, Telemetry bed  Severity of Illness: The appropriate patient status for this patient is INPATIENT. Inpatient status is judged to be reasonable and necessary in order to provide the required intensity of service to ensure the patient's safety. The patient's presenting symptoms, physical exam findings, and initial radiographic and laboratory data in the context of their chronic comorbidities is felt to place them at high risk for further clinical deterioration. Furthermore, it is not anticipated that the patient will be medically stable for discharge from the hospital within 2 midnights of admission.   * I certify that at the point of admission it is my clinical judgment that the patient will require inpatient hospital care spanning beyond 2 midnights from the point of admission due to high intensity of service, high risk for further deterioration and high frequency of surveillance required.*   Dorn Dawson, MD [facility]  How to contact the [facility] Attending or Consulting provider 7A - 7P or covering provider during after hours 7P -7A, for this patient.  Check the care team in [facility] and look for a) attending/consulting [facility] provider listed and b) the [facility] team listed Log into [email or web address] and use [facility] universal password to access. If you do not have the password, please contact the hospital operator. Locate the [facility] provider you are looking for under [facility] and page to a number that you can be directly reached. If you still have difficulty reaching the provider, please page the [facility] (Director on Call) for the Hospitalists listed  on amion for assistance.  10/05/2023, 9:34 PM

## 2023-10-05 NOTE — Congregational Nurse Program (Signed)
 Home visit following phone call from brother y'Phi that he would not be able to get patient to appointment with [facility] tomorrow morning because he he unable to get up from floor where he has been sleeping since last ED visit.  When he tries to walk he become shakey and falls.  He is taking antibiotic (Keflex ) as ordered after last ED visit.  When CN arrived he was very lethargic but did respond.  Temp was 102.8 (temporal), pulse 84 and bounding, BP 120/74 and respirations 84 and labored.  Could not express pain except stated his legs were numb,  Brother has been emptying catheter bag since instructed how  on 10/01/2023 by CN.  We called 911 to have patient re-evaluated due to weakness, fever and labored breathing.  Elveria Rummer RN, Congregational Nurse [phone]

## 2023-10-05 NOTE — ED Triage Notes (Signed)
 BIBA Per EMS: Pt coming from home w/ c/o AMS and possible sepsis. Pt was septic 1 week ago and was dc from hospital w/ foley catheter. Family did not know how to empty foley bag so bag had not been emptied x 1 week. Pt more confused than normal.  100.0 Temp  18G LAC  20G R hand 200cc given en route

## 2023-10-06 DIAGNOSIS — N3001 Acute cystitis with hematuria: Secondary | ICD-10-CM

## 2023-10-06 LAB — HIV ANTIBODY (ROUTINE TESTING W REFLEX): HIV Screen 4th Generation wRfx: NONREACTIVE

## 2023-10-06 LAB — BASIC METABOLIC PANEL WITH GFR
Anion gap: 13 (ref 5–15)
BUN: 18 mg/dL (ref 8–23)
CO2: 22 mmol/L (ref 22–32)
Calcium: 8.2 mg/dL — ABNORMAL LOW (ref 8.9–10.3)
Chloride: 98 mmol/L (ref 98–111)
Creatinine, Ser: 1.74 mg/dL — ABNORMAL HIGH (ref 0.61–1.24)
GFR, Estimated: 43 mL/min — ABNORMAL LOW (ref >60)
Glucose, Bld: 97 mg/dL (ref 70–99)
Potassium: 3.4 mmol/L — ABNORMAL LOW (ref 3.5–5.1)
Sodium: 133 mmol/L — ABNORMAL LOW (ref 135–145)

## 2023-10-06 LAB — CBC
HCT: 41.3 % (ref 39.0–52.0)
Hemoglobin: 13.1 g/dL (ref 13.0–17.0)
MCH: 29.8 pg (ref 26.0–34.0)
MCHC: 31.7 g/dL (ref 30.0–36.0)
MCV: 94.1 fL (ref 80.0–100.0)
Platelets: 216 K/uL (ref 150–400)
RBC: 4.39 MIL/uL (ref 4.22–5.81)
RDW: 14.5 % (ref 11.5–15.5)
WBC: 32.6 K/uL — ABNORMAL HIGH (ref 4.0–10.5)
nRBC: 0 % (ref 0.0–0.2)

## 2023-10-06 LAB — MAGNESIUM: Magnesium: 1.7 mg/dL (ref 1.7–2.4)

## 2023-10-06 LAB — RESP PANEL BY RT-PCR (RSV, FLU A&B, COVID)  RVPGX2
Influenza A by PCR: NEGATIVE
Influenza B by PCR: NEGATIVE
Resp Syncytial Virus by PCR: NEGATIVE
SARS Coronavirus 2 by RT PCR: NEGATIVE

## 2023-10-06 LAB — PHOSPHORUS: Phosphorus: 1.2 mg/dL — ABNORMAL LOW (ref 2.5–4.6)

## 2023-10-06 LAB — TSH: TSH: 0.955 u[IU]/mL (ref 0.350–4.500)

## 2023-10-06 MED ORDER — IPRATROPIUM-ALBUTEROL 0.5-2.5 (3) MG/3ML IN SOLN
3.0000 mL | Freq: Two times a day (BID) | RESPIRATORY_TRACT | Status: DC
  Administered 2023-10-06 – 2023-10-07 (×3): 3 mL via RESPIRATORY_TRACT
  Filled 2023-10-06 (×3): qty 3

## 2023-10-06 MED ORDER — POTASSIUM PHOSPHATES 15 MMOLE/5ML IV SOLN
30.0000 mmol | Freq: Once | INTRAVENOUS | Status: AC
  Administered 2023-10-06: 30 mmol via INTRAVENOUS
  Filled 2023-10-06: qty 10

## 2023-10-06 MED ORDER — POTASSIUM CHLORIDE CRYS ER 20 MEQ PO TBCR
40.0000 meq | EXTENDED_RELEASE_TABLET | Freq: Once | ORAL | Status: AC
  Administered 2023-10-06: 40 meq via ORAL
  Filled 2023-10-06: qty 2

## 2023-10-06 MED ORDER — OXYCODONE HCL 5 MG PO TABS
5.0000 mg | ORAL_TABLET | ORAL | Status: DC | PRN
  Administered 2023-10-07 – 2023-10-11 (×6): 5 mg via ORAL
  Filled 2023-10-06 (×4): qty 1

## 2023-10-06 MED ORDER — ACETAMINOPHEN 500 MG PO TABS
1000.0000 mg | ORAL_TABLET | Freq: Three times a day (TID) | ORAL | Status: DC
  Administered 2023-10-06 – 2023-10-11 (×15): 1000 mg via ORAL
  Filled 2023-10-06 (×16): qty 2

## 2023-10-06 MED ORDER — SODIUM CHLORIDE 0.9 % IV SOLN: INTRAVENOUS | Status: DC

## 2023-10-06 NOTE — Evaluation (Signed)
 Physical Therapy Evaluation Patient Details Name: [name] MRN: [id number] DOB: 1960-11-19 Today's Date: 10/06/2023  History of Present Illness  63 y.o. male, [name] speaking, with hx of TBI, seizure disorder, debility, HTN, BPH, CKD3, PTSD, recent ED visit 7/27 with fall out of wheelchair, and on 7/31 for L foot wound discharged on Keflex  for cellulitis, also noted to have retention and urinary catheter place. Brought back in for weakness and fever.  Clinical Impression  Pt admitted with above diagnosis.  Pt currently with functional limitations due to the deficits listed below (see PT Problem List). Pt will benefit from acute skilled PT to increase their independence and safety with mobility to allow discharge.      Interpreter utilized to communicate with patient. Patient resides with brother and 2 friends, reports has not been able to ambulate for 1 week due to pain- all over  and L foot  today Patient usually uses a cane. Does have a WC.   Patient noted with whole body shaking, indicates that he is cold.  Patient required 2 person assist for mobility, supine to sit to stand, took a few side steps.  Patient will benefit from continued inpatient follow up therapy, <3 hours/day  unless progresses to return home wth close supervision/assistance.      If plan is discharge home, recommend the following: A lot of help with walking and/or transfers;A lot of help with bathing/dressing/bathroom;Assistance with cooking/housework;Assist for transportation;Help with stairs or ramp for entrance   Can travel by private vehicle   No    Equipment Recommendations None recommended by PT  Recommendations for Other Services       Functional Status Assessment Patient has had a recent decline in their functional status and demonstrates the ability to make significant improvements in function in a reasonable and predictable amount of time.     Precautions / Restrictions Precautions Precautions:  Fall Precaution/Restrictions Comments: left foot pian Restrictions Weight Bearing Restrictions Per Provider Order: No      Mobility  Bed Mobility Overal bed mobility: Needs Assistance Bed Mobility: Rolling, Sidelying to Sit, Sit to Supine Rolling: Max assist, Used rails, +2 for physical assistance, Total assist Sidelying to sit: +2 for safety/equipment, +2 for physical assistance, Max assist, HOB elevated   Sit to supine: Mod assist, +2 for safety/equipment   General bed mobility comments: multimodal cuse to roll reach for rail, max of 2 to move legs anf trunk to sitting onto bed edge, mod to return legs to supine    Transfers Overall transfer level: Needs assistance Equipment used: Rolling walker (2 wheels) Transfers: Sit to/from Stand Sit to Stand: Mod assist, +2 physical assistance, +2 safety/equipment, From elevated surface           General transfer comment: mod assist to stand from bed x 1, indicating foot pain on both. Sat down for a rest, stood again at RW , able to side step slowly to left to [facility] with Rw and 2 person assist.    Ambulation/Gait                  Stairs            Wheelchair Mobility     Tilt Bed    Modified Rankin (Stroke Patients Only)       Balance Overall balance assessment: Needs assistance, History of Falls Sitting-balance support: Bilateral upper extremity supported, Feet supported Sitting balance-Leahy Scale: Fair     Standing balance support: During functional activity, Bilateral upper extremity  supported, Reliant on assistive device for balance Standing balance-Leahy Scale: Poor                               Pertinent Vitals/Pain Pain Assessment Pain Assessment: PAINAD Breathing: occasional labored breathing, short period of hyperventilation Negative Vocalization: occasional moan/groan, low speech, negative/disapproving quality Facial Expression: sad, frightened, frown Body Language: tense,  distressed pacing, fidgeting Consolability: distracted or reassured by voice/touch PAINAD Score: 5    Home Living Family/patient expects to be discharged to:: Private residence Living Arrangements: Other relatives Available Help at Discharge: Family;Available PRN/intermittently Type of Home: House Home Access: Stairs to enter   [employer] of Steps: 4   Home Layout: One level Home Equipment: Wheelchair - manual;Cane - single point Additional Comments: brother and 2 friends, AN is a friend at home, does not use Wc- ? broken, recent Ed notes pt. Had fallen from Wc, Wc was in the road.   Prior Function               Mobility Comments: cane , 1 week cannot walk,, has a Wc ADLs Comments: mod I/Ind, spponge bath, brother cooks     Extremity/Trunk Assessment        Lower Extremity Assessment Lower Extremity Assessment: RLE deficits/detail;LLE deficits/detail RLE Deficits / Details: weakness, tremors/shaking, tenses up when moving to supine LLE Deficits / Details: indicates pain when first standing, noted bruised area at heel( has H/O left foot pain per epic notes)    Cervical / Trunk Assessment Cervical / Trunk Assessment: Other exceptions Cervical / Trunk Exceptions: tenses in trunk, shaking indiocates that he is cold  Communication   Communication Factors Affecting Communication:  ([place] interpreter used)    Cognition Arousal: Alert Behavior During Therapy: WFL for tasks assessed/performed   PT - Cognitive impairments: No family/caregiver present to determine baseline                       PT - Cognition Comments: stated  in room, when asked, stated  yes to Hospital, oriented to [place], not date Following commands: Impaired Following commands impaired: Follows one step commands inconsistently     Cueing Cueing Techniques: Gestural cues     General Comments      Exercises     Assessment/Plan    PT Assessment Patient needs continued  PT services  PT Problem List Decreased strength;Decreased mobility;Decreased safety awareness;Decreased range of motion;Decreased knowledge of precautions;Decreased activity tolerance;Decreased balance;Decreased knowledge of use of DME;Pain       PT Treatment Interventions DME instruction;Therapeutic activities;Gait training;Therapeutic exercise;Patient/family education;Functional mobility training;Balance training    PT Goals (Current goals can be found in the Care Plan section)  Acute Rehab PT Goals PT Goal Formulation: Patient unable to participate in goal setting Time For Goal Achievement: 10/20/23 Potential to Achieve Goals: Fair    Frequency Min 2X/week     Co-evaluation PT/OT/SLP Co-Evaluation/Treatment: Yes Reason for Co-Treatment: Necessary to address cognition/behavior during functional activity;For patient/therapist safety;To address functional/ADL transfers PT goals addressed during session: Mobility/safety with mobility;Balance OT goals addressed during session: ADL's and self-care;Proper use of Adaptive equipment and DME       AM-PAC PT 6 Clicks Mobility  Outcome Measure Help needed turning from your back to your side while in a flat bed without using bedrails?: A Lot Help needed moving from lying on your back to sitting on the side of a flat bed without using bedrails?:  A Lot Help needed moving to and from a bed to a chair (including a wheelchair)?: A Lot Help needed standing up from a chair using your arms (e.g., wheelchair or bedside chair)?: A Lot Help needed to walk in hospital room?: Total Help needed climbing 3-5 steps with a railing? : Total 6 Click Score: 10    End of Session Equipment Utilized During Treatment: Gait belt Activity Tolerance: Patient limited by fatigue Patient left: in bed;with call bell/phone within reach;with nursing/sitter in room;with bed alarm set Nurse Communication: Mobility status PT Visit Diagnosis: Unsteadiness on feet  (R26.81);Difficulty in walking, not elsewhere classified (R26.2)    Time: 8567-8484 PT Time Calculation (min) (ACUTE ONLY): 43 min   Charges:   PT Evaluation $PT Eval Low Complexity: 1 Low   PT General Charges $$ ACUTE PT VISIT: 1 Visit         Darice Potters PT Acute Rehabilitation Services Office [phone]   Potters Darice Norris 10/06/2023, 3:47 PM

## 2023-10-06 NOTE — TOC Initial Note (Addendum)
 Transition of Care [facility]) - Initial/Assessment Note    Patient Details  Name: [name] MRN: [id number] Date of Birth: 05/01/1960  [facility]) CM/SW Contact:    Heather DELENA Saltness, LCSW Phone Number: 10/06/2023, 2:12 PM  Clinical Narrative:                  ADDENDUM  CSW received secure chat from [place] with Interpreting services regarding pt's language:   Interpreters available Mon-Fri 8:30am - 5pm by phone:   [facility] Ph # [phone]  [employer] Ph # [phone]   After-hours & weekend [name] services (Pager) [phone]   Onsite(face to face) sessions - please contact our office [facility] Interpreter Services Ph [phone] (M-F 8:30a-5p)   Pt admitted to hospital from home with brother. Pt speaks Publishing rights manager, a distinct language of the [place] community in the [address] of [place]. Pt does not speak [place]. CSW unable to use translation services due to not having translator who can translate Publishing rights manager. Pt's brother, Ypai Herrman, speaks some english. Per chart review, pt receives [facility] RN services through [facility] [employer] [facility]. PT/OT evaluation currently pending. TOC will continue to follow for discharge needs.    Expected Discharge Plan: Home w Home Health Services Barriers to Discharge: Continued Medical Work up   Patient Goals and CMS Choice Patient states their goals for this hospitalization and ongoing recovery are:: To return home          Expected Discharge Plan and Services In-house Referral: Clinical Social Work Discharge Planning Services: NA Post Acute Care Choice: NA Living arrangements for the past 2 months: Skilled Nursing Facility                 DME Arranged: N/A DME Agency: NA       HH Arranged: NA HH Agency: NA        Prior Living Arrangements/Services Living arrangements for the past 2 months: Skilled Nursing Facility Lives with:: Self Patient language and need for interpreter  reviewed:: Yes (No interpreter available to translate [name]) Do you feel safe going back to the place where you live?: Yes      Need for Family Participation in Patient Care: Yes (Comment) Care giver support system in place?: Yes (comment) Current home services: Home RN Criminal Activity/Legal Involvement Pertinent to Current Situation/Hospitalization: No - Comment as needed   Permission Sought/Granted Permission sought to share information with : Family Supports Permission granted to share information with : Yes, Verbal Permission Granted  Share Information with NAME: Ypai Mincer  Permission granted to share info w AGENCY: [facility]  Permission granted to share info w Relationship: Brother  Permission granted to share info w Contact Information: [phone]  Emotional Assessment Appearance::  (UTA) Attitude/Demeanor/Rapport: Unable to Assess Affect (typically observed): Unable to Assess Orientation: : Oriented to Self Alcohol / Substance Use: Not Applicable Psych Involvement: No (comment)  Admission diagnosis:  Acute cystitis with hematuria [N30.01] Sepsis (HCC) [A41.9] Patient Active Problem List   Diagnosis Date Noted   Urinary tract infection 10/05/2023   Traumatic brain injury with loss of consciousness (HCC) 01/09/2021   Partial symptomatic epilepsy with complex partial seizures, not intractable, without status epilepticus (HCC) 06/27/2020   TBI (traumatic brain injury) (HCC)    Fall 07/02/2019   CKD (chronic kidney disease) stage 3, GFR 30-59 ml/min (HCC) 07/02/2019   Cognitive impairment 01/10/2019   Prolonged seizure (HCC) 09/30/2018   Noncompliance with medication regimen 11/22/2017  Altered mental status 03/27/2016   Sepsis (HCC) 07/03/2015   CKD (chronic kidney disease), stage II    Abdominal pain    Seizures (HCC)    Encounter for therapeutic drug monitoring 01/03/2014   Partial epilepsy with impairment of consciousness,  intractable (HCC) 03/13/2013   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 there is no improvement in right 12/18/2011   Seizure (HCC) 12/17/2011   Altered mental state 12/17/2011   PCP:  Shelda Atlas, MD Pharmacy:   [facility] [address] [place] - [address] [place] [address] Phone: [phone] Fax: [phone]     Social Drivers of Health (SDOH) Social History: SDOH Screenings   Food Insecurity: Patient Unable To Answer (10/06/2023)  Housing: Unknown (10/06/2023)  Transportation Needs: Patient Unable To Answer (10/06/2023)  Utilities: Patient Unable To Answer (10/06/2023)  Social Connections: Patient Unable To Answer (10/06/2023)  Tobacco Use: Medium Risk (10/05/2023)   SDOH Interventions: None indicated     Readmission Risk Interventions    10/06/2023    2:08 PM  Readmission Risk Prevention Plan  Transportation Screening Complete  PCP or Specialist Appt within 5-7 Days Complete  Home Care Screening Complete  Medication Review (RN CM) Complete    Signed: Heather Saltness, MSW, LCSW Clinical Social Worker Inpatient Care Management 10/06/2023 2:18 PM

## 2023-10-06 NOTE — Plan of Care (Signed)

## 2023-10-06 NOTE — Evaluation (Signed)
 Occupational Therapy Evaluation Patient Details Name: [name] MRN: [id number] DOB: 26-Oct-1960 Today's Date: 10/06/2023   History of Present Illness   63 yr old male brought to the hospital for weakness and fever. Found to have possible CAUTI and sepsis. PMH: TBI, seizure disorder, debility, HTN, BPH, CKD3, PTSD, recent ED visit 7/27 with fall out of wheelchair     Clinical Impressions The pt is currently presenting with the below listed deficits (see OT problem list). During the session, he required max assist for supine to sit, total assist to donn his socks, and mod assist x2 to stand using a RW. He was noted to be with increased shakiness and instability in standing. Mod assist x2 with RW was needed for him to take lateral steps along the EOB. He reported feeling cold & weak, and he also indicated having both generalized pain, as well as pain in his feet with standing. At current, he is at high risk for falls and restricted ADL participation. Further OT services are therefore warranted. Patient will benefit from continued inpatient follow up therapy, <3 hours/day.      If plan is discharge home, recommend the following:   A lot of help with walking and/or transfers;A lot of help with bathing/dressing/bathroom;Assistance with cooking/housework;Help with stairs or ramp for entrance;Assist for transportation     Functional Status Assessment   Patient has had a recent decline in their functional status and demonstrates the ability to make significant improvements in function in a reasonable and predictable amount of time.     Equipment Recommendations   Other (comment) (to be determined pending functional progress)     Recommendations for Other Services         Precautions/Restrictions   Precautions Precautions: Fall Precaution/Restrictions Comments:  (L foot pain) Restrictions Weight Bearing Restrictions Per Provider Order: No     Mobility Bed Mobility Overal bed  mobility: Needs Assistance Bed Mobility: Rolling, Sidelying to Sit, Sit to Supine Rolling: Max assist, +2 for physical assistance Sidelying to sit: +2 for safety/equipment, +2 for physical assistance, Max assist, HOB elevated   Sit to supine: Mod assist, +2 for safety/equipment   General bed mobility comments: multimodal cuse to roll reach for rail, max of 2 to move legs and trunk to sitting onto bed edge, mod to return legs to supine    Transfers Overall transfer level: Needs assistance Equipment used: Rolling walker (2 wheels) Transfers: Sit to/from Stand Sit to Stand: Mod assist, +2 physical assistance, +2 safety/equipment, From elevated surface           General transfer comment: mod assist to stand from bed x 1, indicating foot pain bilaterally. Sat down for a rest. Stood again at [facility] , able to side step slowly to left to [facility]      Balance     Sitting balance-Leahy Scale: Fair       Standing balance-Leahy Scale: Poor         ADL either performed or assessed with clinical judgement   ADL Overall ADL's : Needs assistance/impaired Eating/Feeding: Set up;Bed level   Grooming: Set up;Supervision/safety;Bed level           Upper Body Dressing : Minimal assistance;Bed level   Lower Body Dressing: Maximal assistance;Sitting/lateral leans       Toileting- Clothing Manipulation and Hygiene: Maximal assistance Toileting - Clothing Manipulation Details (indicate cue type and reason): at bedside commode level, based on clinical judgement  Pertinent Vitals/Pain Pain Assessment Pain Assessment: Faces Pain Score: 5  Pain Location: Feet to touch and in standing Pain Intervention(s): Limited activity within patient's tolerance, Monitored during session     Extremity/Trunk Assessment Upper Extremity Assessment Upper Extremity Assessment: RUE deficits/detail;LUE deficits/detail RUE Deficits / Details: AROM WFL. FUnctional hand grasp LUE Deficits /  Details: AROM WFL. FUnctional hand grasp   Lower Extremity Assessment Lower Extremity Assessment: Generalized weakness RLE Deficits / Details: weakness, tremors/shaking, tenses up when moving to supine LLE Deficits / Details: indicates pain when first standing, noted bruised area at heel( has H/O left foot pain per epic notes)   Cervical / Trunk Assessment Cervical / Trunk Assessment: Other exceptions Cervical / Trunk Exceptions: tenses in trunk, shaking indiocates that he is cold   Communication Communication Factors Affecting Communication: Non - English speaking, interpreter not available (He speaks Publishing rights manager. Phone interpreter used. Interpreter name=[place], phone number [phone])   Cognition Arousal: Alert Behavior During Therapy: WFL for tasks assessed/performed Cognition: Difficult to assess, History of cognitive impairments             OT - Cognition Comments: Per his medical chart, he has a history of TBI. He was oriented to his name and to being in the hospital. Disoriented to time.                   Following commands impaired: Follows one step commands inconsistently     Cueing  General Comments   Cueing Techniques: Gestural cues              Home Living Family/patient expects to be discharged to:: Private residence Living Arrangements: Other relatives (Brother and 2 other friends) Available Help at Discharge: Family;Available PRN/intermittently Type of Home: House Home Access: Stairs to enter [employer] of Steps: 4   Home Layout: One level               Home Equipment: Wheelchair - manual;Cane - single point   Additional Comments:  (He reported having a wheelchair but doesn't use it due to it being broken.)      Prior Functioning/Environment Prior Level of Function : Independent/Modified Independent             Mobility Comments:  (He used a cane for household ambulation. He has been week over the past week,  making it difficult for him to walk.) ADLs Comments:  (He was modified independent to independent with ADLs, though he has had difficulty performing self-care tasks over the past week due to weakness. He brother cooked.)    OT Problem List: Decreased strength;Decreased activity tolerance;Impaired balance (sitting and/or standing);Decreased safety awareness;Decreased knowledge of use of DME or AE;Pain   OT Treatment/Interventions: Self-care/ADL training;Therapeutic exercise;Therapeutic activities;Energy conservation;Patient/family education;DME and/or AE instruction;Balance training      OT Goals(Current goals can be found in the care plan section)   Acute Rehab OT Goals OT Goal Formulation: With patient Time For Goal Achievement: 10/20/23 Potential to Achieve Goals: Good ADL Goals Pt Will Perform Lower Body Dressing: sit to/from stand;sitting/lateral leans;with contact guard assist Pt Will Transfer to Toilet: ambulating;with contact guard assist Pt Will Perform Toileting - Clothing Manipulation and hygiene: sit to/from stand;with contact guard assist   OT Frequency:  Min 2X/week    Co-evaluation PT/OT/SLP Co-Evaluation/Treatment: Yes Reason for Co-Treatment: Necessary to address cognition/behavior during functional activity;For patient/therapist safety;To address functional/ADL transfers PT goals addressed during session: Mobility/safety with mobility;Balance OT goals addressed during session: ADL's and self-care;Proper use of Adaptive  equipment and DME      AM-PAC OT 6 Clicks Daily Activity     Outcome Measure Help from another person eating meals?: None Help from another person taking care of personal grooming?: A Little Help from another person toileting, which includes using toliet, bedpan, or urinal?: A Lot Help from another person bathing (including washing, rinsing, drying)?: A Lot Help from another person to put on and taking off regular upper body clothing?: A  Little Help from another person to put on and taking off regular lower body clothing?: A Lot 6 Click Score: 16   End of Session Equipment Utilized During Treatment: Rolling walker (2 wheels);Gait belt Nurse Communication: Mobility status  Activity Tolerance: Other (comment) (fair tolerance) Patient left: in bed;with call bell/phone within reach;with bed alarm set  OT Visit Diagnosis: Unsteadiness on feet (R26.81);Other abnormalities of gait and mobility (R26.89);Muscle weakness (generalized) (M62.81);Pain Pain - part of body:  (feet)                Time: 1432-1510 OT Time Calculation (min): 38 min Charges:  OT General Charges $OT Visit: 1 Visit OT Evaluation $OT Eval Moderate Complexity: 1 Mod    Demeka Sutter L Rorey Hodges, OTR/L 10/06/2023, 5:36 PM

## 2023-10-06 NOTE — Progress Notes (Signed)
 PROGRESS NOTE    [name]  FMW:[id number] DOB: 04-15-1960 DOA: 10/05/2023 PCP: Shelda Atlas, MD   Brief Narrative:  63 y.o. male, [name] speaking, with hx of TBI, seizure disorder, debility, HTN, BPH, CKD3, recent ED visit 7/27 with fall out of wheelchair, and on 7/31 for L foot wound discharged on Keflex  for cellulitis, also noted to have retention and urinary catheter place. Brought back in for weakness and fever.   Per RN note:  Home visit following phone call from brother y'Phi that he would not be able to get patient to appointment with [facility] tomorrow morning because he he unable to get up from floor where he has been sleeping since last ED visit.  When he tries to walk he become shakey and falls.  He is taking antibiotic (Keflex ) as ordered after last ED visit.  When CN arrived he was very lethargic but did respond.  Temp was 102.8 (temporal), pulse 84 and bounding, BP 120/74 and respirations 84 and labored.  Could not express pain except stated his legs were numb,  Brother has been emptying catheter bag since instructed how  on 10/01/2023 by CN.  We called 911 to have patient re-evaluated due to weakness, fever and labored breathing.  Elveria Rummer RN, Congregational Nurse [phone]   Per ED nurse family did not know how to empty Foley bag so bag had not been emptied for the weekend he was admitted with confusion and fever.  Assessment & Plan:   Principal Problem:   Sepsis (HCC) Active Problems:   Urinary tract infection   Fever, suspect urinary or skin source  Possible CAUTI, catheter present on admission.  Sepsis, secondary to above, present on admission-patient was brought in after the home health nurse noted him to be confused weak with a fever of 102 tachypneic at 20s.  Source of infection likely urinary tract infection.  Chest x-ray negative.  CT head no acute findings. White count on admission 28, lactate 2.1 down to 1 after IV fluids. Continue  ceftriaxone .  Urine culture in progress. Continue IV fluids soft bp and creatinine trending up Oral intake poor Will get a CT abdomen and pelvis once renal function improves.   Generalized weakness -PT/OT evaluation   Hypokalemia/hypophosphatemia Replete   Chronic medical problems: History of TBI: Noted Seizure disorder: Continue home lamotrigine  and Keppra  Hypertension: Not on antihypertensives at time of admission BPH: Continue home Flomax  CKD 3a: Baseline creatinine 1.4-1.5   Estimated body mass index is 31.62 kg/m as calculated from the following:   Height as of this encounter: 5' 5 (1.651 m).   Weight as of this encounter: 86.2 kg.  DVT prophylaxis: scd Code Status:full Family Communication: dw brother  Disposition Plan:  Status is: Inpatient Remains inpatient appropriate because: acute illness   Consultants: none  Procedures: none Antimicrobials: Anti-infectives (From admission, onward)    Start     Dose/Rate Route Frequency Ordered Stop   10/06/23 2130  cefTRIAXone  (ROCEPHIN ) 2 g in sodium chloride  0.9 % 100 mL IVPB        2 g 200 mL/hr over 30 Minutes Intravenous Every 24 hours 10/05/23 2147     10/05/23 2130  cefTRIAXone  (ROCEPHIN ) 2 g in sodium chloride  0.9 % 100 mL IVPB        2 g 200 mL/hr over 30 Minutes Intravenous  Once 10/05/23 2119 10/05/23 2159        Subjective: In some distress due to pain, discussed with brother who speaks some [place]  Objective: Vitals:   10/05/23 2300 10/06/23 0000 10/06/23 0111 10/06/23 0441  BP: (!) 156/74 (!) 149/74  (!) 120/53  Pulse: 82 80  87  Resp: 18 18  15   Temp:  98.1 F (36.7 C)  98.9 F (37.2 C)  TempSrc:      SpO2: 99% 96% 99% 95%  Weight:      Height:        Intake/Output Summary (Last 24 hours) at 10/06/2023 9191 Last data filed at 10/06/2023 0014 Gross per 24 hour  Intake --  Output 800 ml  Net -800 ml   Filed Weights   10/05/23 1951  Weight: 86.2 kg    Examination:  General exam:  Appears in distress due to abdominal pain Respiratory system: Clear to auscultation. Respiratory effort normal. Cardiovascular system: Regular Gastrointestinal system: Soft generalized tenderness no rebound Central nervous system: Awake. Extremities: No edema    Data Reviewed: I have personally reviewed following labs and imaging studies  CBC: Recent Labs  Lab 09/30/23 1652 10/05/23 1701 10/05/23 2011  WBC 7.3 23.6*  --   NEUTROABS 4.7 20.2*  --   HGB 12.9* 12.8* 12.9*  HCT 39.0 40.0 38.0*  MCV 91.3 91.7  --   PLT 276 273  --    Basic Metabolic Panel: Recent Labs  Lab 09/30/23 1652 10/05/23 1952 10/05/23 2011  NA 137 137 138  K 3.1* 3.3* 3.3*  CL 104 103 102  CO2 25 25  --   GLUCOSE 101* 121* 121*  BUN 12 17 16   CREATININE 1.51* 1.76* 1.70*  CALCIUM 9.0 8.0*  --    GFR: Estimated Creatinine Clearance: 43.8 mL/min (A) (by C-G formula based on SCr of 1.7 mg/dL (H)). Liver Function Tests: Recent Labs  Lab 09/30/23 1652 10/05/23 1952  AST 17 22  ALT 11 10  ALKPHOS 82 76  BILITOT 0.8 0.7  PROT 7.2 6.8  ALBUMIN 3.6 3.1*   No results for input(s): LIPASE, AMYLASE in the last 168 hours. No results for input(s): AMMONIA in the last 168 hours. Coagulation Profile: Recent Labs  Lab 10/05/23 1952  INR 1.1   Cardiac Enzymes: No results for input(s): CKTOTAL, CKMB, CKMBINDEX, TROPONINI in the last 168 hours. BNP (last 3 results) No results for input(s): PROBNP in the last 8760 hours. HbA1C: No results for input(s): HGBA1C in the last 72 hours. CBG: Recent Labs  Lab 09/30/23 1516  GLUCAP 112*   Lipid Profile: No results for input(s): CHOL, HDL, LDLCALC, TRIG, CHOLHDL, LDLDIRECT in the last 72 hours. Thyroid  Function Tests: No results for input(s): TSH, T4TOTAL, FREET4, T3FREE, THYROIDAB in the last 72 hours. Anemia Panel: No results for input(s): VITAMINB12, FOLATE, FERRITIN, TIBC, IRON, RETICCTPCT in  the last 72 hours. Sepsis Labs: Recent Labs  Lab 10/05/23 1710 10/05/23 2012  LATICACIDVEN 2.1* 1.0    Recent Results (from the past 240 hours)  Blood Culture (routine x 2)     Status: None (Preliminary result)   Collection Time: 10/05/23  5:01 PM   Specimen: BLOOD  Result Value Ref Range Status   Specimen Description   Final    BLOOD LEFT ANTECUBITAL Performed at [facility], [address] [place], [place] [address]    Special Requests   Final    BOTTLES DRAWN AEROBIC AND ANAEROBIC Blood Culture results may not be optimal due to an inadequate volume of blood received in culture bottles Performed at [facility], [address], [place], [place] [address]  Culture   Final    NO GROWTH < 12 HOURS Performed at [facility], [address] [place], [place] [address]    Report Status PENDING  Incomplete  Resp panel by RT-PCR (RSV, Flu A&B, Covid) Anterior Nasal Swab     Status: None   Collection Time: 10/05/23 11:33 PM   Specimen: Anterior Nasal Swab  Result Value Ref Range Status   SARS Coronavirus 2 by RT PCR NEGATIVE NEGATIVE Final    Comment: (NOTE) SARS-CoV-2 target nucleic acids are NOT DETECTED.  The SARS-CoV-2 RNA is generally detectable in upper respiratory specimens during the acute phase of infection. The lowest concentration of SARS-CoV-2 viral copies this assay can detect is 138 copies/mL. A negative result does not preclude SARS-Cov-2 infection and should not be used as the sole basis for treatment or other patient management decisions. A negative result may occur with  improper specimen collection/handling, submission of specimen other than nasopharyngeal swab, presence of viral mutation(s) within the areas targeted by this assay, and inadequate number of viral copies(<138 copies/mL). A negative result must be combined with clinical observations, patient history, and epidemiological information. The expected  result is Negative.  Fact Sheet for Patients:  BloggerCourse.com  Fact Sheet for Healthcare Providers:  SeriousBroker.it  This test is no t yet approved or cleared by the [place]  FDA and  has been authorized for detection and/or diagnosis of SARS-CoV-2 by FDA under an Emergency Use Authorization (EUA). This EUA will remain  in effect (meaning this test can be used) for the duration of the COVID-19 declaration under Section 564(b)(1) of the Act, 21 U.S.C.section 360bbb-3(b)(1), unless the authorization is terminated  or revoked sooner.       Influenza A by PCR NEGATIVE NEGATIVE Final   Influenza B by PCR NEGATIVE NEGATIVE Final    Comment: (NOTE) The Xpert Xpress SARS-CoV-2/FLU/RSV plus assay is intended as an aid in the diagnosis of influenza from Nasopharyngeal swab specimens and should not be used as a sole basis for treatment. Nasal washings and aspirates are unacceptable for Xpert Xpress SARS-CoV-2/FLU/RSV testing.  Fact Sheet for Patients: BloggerCourse.com  Fact Sheet for Healthcare Providers: SeriousBroker.it  This test is not yet approved or cleared by the [place]  FDA and has been authorized for detection and/or diagnosis of SARS-CoV-2 by FDA under an Emergency Use Authorization (EUA). This EUA will remain in effect (meaning this test can be used) for the duration of the COVID-19 declaration under Section 564(b)(1) of the Act, 21 U.S.C. section 360bbb-3(b)(1), unless the authorization is terminated or revoked.     Resp Syncytial Virus by PCR NEGATIVE NEGATIVE Final    Comment: (NOTE) Fact Sheet for Patients: BloggerCourse.com  Fact Sheet for Healthcare Providers: SeriousBroker.it  This test is not yet approved or cleared by the [place]  FDA and has been authorized for detection and/or diagnosis of  SARS-CoV-2 by FDA under an Emergency Use Authorization (EUA). This EUA will remain in effect (meaning this test can be used) for the duration of the COVID-19 declaration under Section 564(b)(1) of the Act, 21 U.S.C. section 360bbb-3(b)(1), unless the authorization is terminated or revoked.  Performed at [facility], [address], [place], [place] [address]          Radiology Studies: CT Head Wo Contrast Result Date: 10/05/2023 EXAM: CT HEAD WITHOUT CONTRAST 10/05/2023 06:55:07 PM TECHNIQUE: CT of the head was performed without the administration of intravenous contrast. Automated exposure control, iterative reconstruction, and/or weight based adjustment  of the mA/kV was utilized to reduce the radiation dose to as low as reasonably achievable. COMPARISON: Comparison with CT head 09/26/2023. CLINICAL HISTORY: Delirium. Pt coming from home w/ c/o AMS and possible sepsis. Pt was septic 1 week ago and was dc from hospital w/ foley catheter. Family did not know how to empty foley bag so bag had not been emptied x 1 week. Pt more confused than normal. FINDINGS: BRAIN AND VENTRICLES: Chronic microvascular ischemia and generalized atrophy. 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 No evidence of acute infarct. No intracranial hemorrhage or mass effect. No extraaxial fluid collection. Stable ventricular caliber. ORBITS: No acute abnormality. SINUSES: Mucosal thickening in the ethmoid air cells similar to prior. SOFT TISSUES AND SKULL: No acute soft tissue abnormality. No skull fracture. IMPRESSION: 1. No acute intracranial abnormality. Electronically signed by: Norman Gatlin MD 10/05/2023 07:42 PM EDT RP Workstation: [id number]   DG Chest Port 1 View Result Date: 10/05/2023 CLINICAL DATA:  Questionable sepsis. EXAM: PORTABLE CHEST 1 VIEW COMPARISON:  Chest radiograph dated 03/04/2021. FINDINGS: Minimal left lung base atelectasis. No focal consolidation, pleural  effusion, or pneumothorax. The cardiac silhouette is within normal limits. No acute osseous pathology. IMPRESSION: No active disease. Electronically Signed   By: Vanetta Chou M.D.   On: 10/05/2023 18:08     Scheduled Meds:  acetaminophen   1,000 mg Oral TID   Chlorhexidine  Gluconate Cloth  6 each Topical Daily   FLUoxetine   40 mg Oral Daily   ipratropium-albuterol   3 mL Nebulization BID   lamoTRIgine   300 mg Oral BID   levETIRAcetam   2,000 mg Oral BID   sodium chloride  flush  3 mL Intravenous Q12H   tamsulosin   0.4 mg Oral QPC supper   Continuous Infusions:  cefTRIAXone  (ROCEPHIN )  IV       LOS: 1 day    Time spent: 40 min  Almarie KANDICE Hoots, MD 10/06/2023, 8:08 AM

## 2023-10-07 ENCOUNTER — Inpatient Hospital Stay (HOSPITAL_COMMUNITY)

## 2023-10-07 DIAGNOSIS — N3001 Acute cystitis with hematuria: Secondary | ICD-10-CM | POA: Diagnosis not present

## 2023-10-07 LAB — COMPREHENSIVE METABOLIC PANEL WITH GFR
ALT: 12 U/L (ref 0–44)
AST: 34 U/L (ref 15–41)
Albumin: 3.1 g/dL — ABNORMAL LOW (ref 3.5–5.0)
Alkaline Phosphatase: 86 U/L (ref 38–126)
Anion gap: 10 (ref 5–15)
BUN: 17 mg/dL (ref 8–23)
CO2: 23 mmol/L (ref 22–32)
Calcium: 8.4 mg/dL — ABNORMAL LOW (ref 8.9–10.3)
Chloride: 106 mmol/L (ref 98–111)
Creatinine, Ser: 1.54 mg/dL — ABNORMAL HIGH (ref 0.61–1.24)
GFR, Estimated: 50 mL/min — ABNORMAL LOW (ref >60)
Glucose, Bld: 96 mg/dL (ref 70–99)
Potassium: 3.9 mmol/L (ref 3.5–5.1)
Sodium: 139 mmol/L (ref 135–145)
Total Bilirubin: 0.4 mg/dL (ref 0.0–1.2)
Total Protein: 6.6 g/dL (ref 6.5–8.1)

## 2023-10-07 LAB — URINE CULTURE: Culture: >=100000 — AB

## 2023-10-07 LAB — CBC
HCT: 37.8 % — ABNORMAL LOW (ref 39.0–52.0)
Hemoglobin: 12.2 g/dL — ABNORMAL LOW (ref 13.0–17.0)
MCH: 30.3 pg (ref 26.0–34.0)
MCHC: 32.3 g/dL (ref 30.0–36.0)
MCV: 94 fL (ref 80.0–100.0)
Platelets: 184 K/uL (ref 150–400)
RBC: 4.02 MIL/uL — ABNORMAL LOW (ref 4.22–5.81)
RDW: 14.4 % (ref 11.5–15.5)
WBC: 22.8 K/uL — ABNORMAL HIGH (ref 4.0–10.5)
nRBC: 0 % (ref 0.0–0.2)

## 2023-10-07 LAB — MAGNESIUM: Magnesium: 1.8 mg/dL (ref 1.7–2.4)

## 2023-10-07 LAB — PHOSPHORUS: Phosphorus: 2.8 mg/dL (ref 2.5–4.6)

## 2023-10-07 MED ORDER — SODIUM CHLORIDE 0.9 % IV SOLN
2.0000 g | Freq: Two times a day (BID) | INTRAVENOUS | Status: DC
  Administered 2023-10-07 – 2023-10-11 (×11): 2 g via INTRAVENOUS
  Filled 2023-10-07 (×9): qty 12.5

## 2023-10-07 MED ORDER — IPRATROPIUM-ALBUTEROL 0.5-2.5 (3) MG/3ML IN SOLN: 3.0000 mL | Freq: Four times a day (QID) | RESPIRATORY_TRACT | Status: DC | PRN

## 2023-10-07 MED ORDER — MIDODRINE HCL 5 MG PO TABS
5.0000 mg | ORAL_TABLET | Freq: Two times a day (BID) | ORAL | Status: DC
  Administered 2023-10-07 – 2023-10-11 (×6): 5 mg via ORAL
  Filled 2023-10-07 (×6): qty 1

## 2023-10-07 NOTE — Plan of Care (Signed)
Problem: Health Behavior/Discharge Planning: Goal: Ability to manage health-related needs will improve Outcome: Progressing   Problem: Clinical Measurements: Goal: Ability to maintain clinical measurements within normal limits will improve Outcome: Progressing Goal: Respiratory complications will improve Outcome: Progressing   Problem: Elimination: Goal: Will not experience complications related to urinary retention Outcome: Progressing

## 2023-10-07 NOTE — Progress Notes (Addendum)
 PROGRESS NOTE    [name]  FMW:[id number] DOB: [birth year] DOA: 10/05/2023 PCP: Shelda Atlas, MD   Brief Narrative:  63 y.o. male, [name] speaking, with hx of TBI, seizure disorder, debility, HTN, BPH, CKD3, recent ED visit 7/27 with fall out of wheelchair, and on 7/31 for L foot wound discharged on Keflex  for cellulitis, also noted to have retention and urinary catheter place. Brought back in for weakness and fever.   Per RN note:  Home visit following phone call from brother [name] that he would not be able to get patient to appointment with [facility] tomorrow morning because he he unable to get up from floor where he has been sleeping since last ED visit.  When he tries to walk he become shakey and falls.  He is taking antibiotic (Keflex ) as ordered after last ED visit.  When CN arrived he was very lethargic but did respond.  Temp was 102.8 (temporal), pulse 84 and bounding, BP 120/74 and respirations 84 and labored.  Could not express pain except stated his legs were numb,  Brother has been emptying catheter bag since instructed how  on 10/01/2023 by CN.  We called 911 to have patient re-evaluated due to weakness, fever and labored breathing.  Elveria Rummer RN, Congregational Nurse [phone]   Per ED nurse family did not know how to empty Foley bag so bag had not been emptied for the weekend he was admitted with confusion and fever.  Assessment & Plan:   Principal Problem:   Sepsis (HCC) Active Problems:   Urinary tract infection   Fever, suspect urinary or skin source   CAUTI, catheter present on admission.  Sepsis, secondary to above, present on admission-patient was brought in after the home health nurse noted him to be confused weak with a fever of 102 tachypneic at 20s.  Source of infection  urinary tract infection.  Urine culture shows 100,000 colonies of Pseudomonas change antibiotics to cefepime  follow-up final culture.  Chest x-ray negative.  CT head no acute  findings. White count on admission 28, lactate 2.1 down to 1 after IV fluids. Continue IV fluids soft bp and creatinine trending up Oral intake poor Wbc down to 22 from 32 Will get a CT abdomen and pelvis once renal function improves.   Generalized weakness -PT/OT evaluation   Hypokalemia/hypophosphatemia Replete   Chronic medical problems: History of TBI: Noted Seizure disorder: Continue home lamotrigine  and Keppra  Hypertension: Not on antihypertensives at time of admission BPH: Continue home Flomax  CKD 3a: Baseline creatinine 1.4-1.5   Estimated body mass index is 31.62 kg/m as calculated from the following:   Height as of this encounter: 5' 5 (1.651 m).   Weight as of this encounter: 86.2 kg.  DVT prophylaxis: scd Code Status:full Family Communication: dw brother  Disposition Plan:  Status is: Inpatient Remains inpatient appropriate because: acute illness   Consultants: none  Procedures: none Antimicrobials: Anti-infectives (From admission, onward)    Start     Dose/Rate Route Frequency Ordered Stop   10/07/23 1100  ceFEPIme  (MAXIPIME ) 2 g in sodium chloride  0.9 % 100 mL IVPB        2 g 200 mL/hr over 30 Minutes Intravenous Every 12 hours 10/07/23 1007     10/06/23 2130  cefTRIAXone  (ROCEPHIN ) 2 g in sodium chloride  0.9 % 100 mL IVPB  Status:  Discontinued        2 g 200 mL/hr over 30 Minutes Intravenous Every 24 hours 10/05/23 2147 10/07/23 1007   10/05/23  2130  cefTRIAXone  (ROCEPHIN ) 2 g in sodium chloride  0.9 % 100 mL IVPB        2 g 200 mL/hr over 30 Minutes Intravenous  Once 10/05/23 2119 10/05/23 2159        Subjective: No overnight events  Coughing with drinking water St consulted  Objective: Vitals:   10/06/23 1948 10/06/23 2032 10/07/23 0450 10/07/23 0843  BP: (!) 107/50  (!) 104/58   Pulse: 81  76   Resp: (!) 21  20   Temp: 99 F (37.2 C)  97.8 F (36.6 C)   TempSrc: Oral  Oral   SpO2: 95% 94% 95% 94%  Weight:      Height:         Intake/Output Summary (Last 24 hours) at 10/07/2023 1026 Last data filed at 10/07/2023 0645 Gross per 24 hour  Intake 1366.75 ml  Output 1950 ml  Net -583.25 ml   Filed Weights   10/05/23 1951  Weight: 86.2 kg    Examination:  General exam: Appears in nad Respiratory system: Clear to auscultation. Respiratory effort normal. Cardiovascular system: Regular Gastrointestinal system: Soft generalized tenderness no rebound Central nervous system: Awake. Extremities: No edema    Data Reviewed: I have personally reviewed following labs and imaging studies  CBC: Recent Labs  Lab 09/30/23 1652 10/05/23 1701 10/05/23 2011 10/06/23 1428 10/07/23 0533  WBC 7.3 23.6*  --  32.6* 22.8*  NEUTROABS 4.7 20.2*  --   --   --   HGB 12.9* 12.8* 12.9* 13.1 12.2*  HCT 39.0 40.0 38.0* 41.3 37.8*  MCV 91.3 91.7  --  94.1 94.0  PLT 276 273  --  216 184   Basic Metabolic Panel: Recent Labs  Lab 09/30/23 1652 10/05/23 1952 10/05/23 2011 10/06/23 0742 10/07/23 0533  NA 137 137 138 133* 139  K 3.1* 3.3* 3.3* 3.4* 3.9  CL 104 103 102 98 106  CO2 25 25  --  22 23  GLUCOSE 101* 121* 121* 97 96  BUN 12 17 16 18 17   CREATININE 1.51* 1.76* 1.70* 1.74* 1.54*  CALCIUM 9.0 8.0*  --  8.2* 8.4*  MG  --   --   --  1.7 1.8  PHOS  --   --   --  1.2* 2.8   GFR: Estimated Creatinine Clearance: 48.3 mL/min (A) (by C-G formula based on SCr of 1.54 mg/dL (H)). Liver Function Tests: Recent Labs  Lab 09/30/23 1652 10/05/23 1952 10/07/23 0533  AST 17 22 34  ALT 11 10 12   ALKPHOS 82 76 86  BILITOT 0.8 0.7 0.4  PROT 7.2 6.8 6.6  ALBUMIN 3.6 3.1* 3.1*   No results for input(s): LIPASE, AMYLASE in the last 168 hours. No results for input(s): AMMONIA in the last 168 hours. Coagulation Profile: Recent Labs  Lab 10/05/23 1952  INR 1.1   Cardiac Enzymes: No results for input(s): CKTOTAL, CKMB, CKMBINDEX, TROPONINI in the last 168 hours. BNP (last 3 results) No results for  input(s): PROBNP in the last 8760 hours. HbA1C: No results for input(s): HGBA1C in the last 72 hours. CBG: Recent Labs  Lab 09/30/23 1516  GLUCAP 112*   Lipid Profile: No results for input(s): CHOL, HDL, LDLCALC, TRIG, CHOLHDL, LDLDIRECT in the last 72 hours. Thyroid  Function Tests: Recent Labs    10/06/23 0742  TSH 0.955   Anemia Panel: No results for input(s): VITAMINB12, FOLATE, FERRITIN, TIBC, IRON, RETICCTPCT in the last 72 hours. Sepsis Labs: Recent Labs  Lab 10/05/23  1710 10/05/23 2012  LATICACIDVEN 2.1* 1.0    Recent Results (from the past 240 hours)  Blood Culture (routine x 2)     Status: None (Preliminary result)   Collection Time: 10/05/23  5:01 PM   Specimen: BLOOD  Result Value Ref Range Status   Specimen Description   Final    BLOOD LEFT ANTECUBITAL Performed at [facility], [address] [name], [name]    Special Requests   Final    BOTTLES DRAWN AEROBIC AND ANAEROBIC Blood Culture results may not be optimal due to an inadequate volume of blood received in culture bottles Performed at [facility], [address], [place], [name]    Culture   Final    NO GROWTH < 12 HOURS Performed at [facility], [address], [place], [place] [address]    Report Status PENDING  Incomplete  Urine Culture     Status: Abnormal   Collection Time: 10/05/23  8:06 PM   Specimen: Urine, Random  Result Value Ref Range Status   Specimen Description   Final    URINE, RANDOM Performed at [facility], [address] [place], [name]    Special Requests   Final    NONE Reflexed from [phone] Performed at [facility], [address], [place], [name]    Culture >=100,000 COLONIES/mL PSEUDOMONAS AERUGINOSA (A)  Final   Report Status 10/07/2023 FINAL  Final   Organism ID, Bacteria PSEUDOMONAS AERUGINOSA (A)  Final       Susceptibility   Pseudomonas aeruginosa - MIC*    CEFTAZIDIME 4 SENSITIVE Sensitive     CIPROFLOXACIN  <=0.25 SENSITIVE Sensitive     GENTAMICIN 2 SENSITIVE Sensitive     IMIPENEM 2 SENSITIVE Sensitive     PIP/TAZO 8 SENSITIVE Sensitive ug/mL    CEFEPIME  2 SENSITIVE Sensitive     * >=100,000 COLONIES/mL PSEUDOMONAS AERUGINOSA  Resp panel by RT-PCR (RSV, Flu A&B, Covid) Anterior Nasal Swab     Status: None   Collection Time: 10/05/23 11:33 PM   Specimen: Anterior Nasal Swab  Result Value Ref Range Status   SARS Coronavirus 2 by RT PCR NEGATIVE NEGATIVE Final    Comment: (NOTE) SARS-CoV-2 target nucleic acids are NOT DETECTED.  The SARS-CoV-2 RNA is generally detectable in upper respiratory specimens during the acute phase of infection. The lowest concentration of SARS-CoV-2 viral copies this assay can detect is 138 copies/mL. A negative result does not preclude SARS-Cov-2 infection and should not be used as the sole basis for treatment or other patient management decisions. A negative result may occur with  improper specimen collection/handling, submission of specimen other than nasopharyngeal swab, presence of viral mutation(s) within the areas targeted by this assay, and inadequate number of viral copies(<138 copies/mL). A negative result must be combined with clinical observations, patient history, and epidemiological information. The expected result is Negative.  Fact Sheet for Patients:  BloggerCourse.com  Fact Sheet for Healthcare Providers:  SeriousBroker.it  This test is no t yet approved or cleared by the [place]  FDA and  has been authorized for detection and/or diagnosis of SARS-CoV-2 by FDA under an Emergency Use Authorization (EUA). This EUA will remain  in effect (meaning this test can be used) for the duration of the COVID-19 declaration under Section 564(b)(1) of the Act, 21 U.S.C.section 360bbb-3(b)(1),  unless the authorization is terminated  or revoked sooner.  Influenza A by PCR NEGATIVE NEGATIVE Final   Influenza B by PCR NEGATIVE NEGATIVE Final    Comment: (NOTE) The Xpert Xpress SARS-CoV-2/FLU/RSV plus assay is intended as an aid in the diagnosis of influenza from Nasopharyngeal swab specimens and should not be used as a sole basis for treatment. Nasal washings and aspirates are unacceptable for Xpert Xpress SARS-CoV-2/FLU/RSV testing.  Fact Sheet for Patients: BloggerCourse.com  Fact Sheet for Healthcare Providers: SeriousBroker.it  This test is not yet approved or cleared by the [place]  FDA and has been authorized for detection and/or diagnosis of SARS-CoV-2 by FDA under an Emergency Use Authorization (EUA). This EUA will remain in effect (meaning this test can be used) for the duration of the COVID-19 declaration under Section 564(b)(1) of the Act, 21 U.S.C. section 360bbb-3(b)(1), unless the authorization is terminated or revoked.     Resp Syncytial Virus by PCR NEGATIVE NEGATIVE Final    Comment: (NOTE) Fact Sheet for Patients: BloggerCourse.com  Fact Sheet for Healthcare Providers: SeriousBroker.it  This test is not yet approved or cleared by the [place]  FDA and has been authorized for detection and/or diagnosis of SARS-CoV-2 by FDA under an Emergency Use Authorization (EUA). This EUA will remain in effect (meaning this test can be used) for the duration of the COVID-19 declaration under Section 564(b)(1) of the Act, 21 U.S.C. section 360bbb-3(b)(1), unless the authorization is terminated or revoked.  Performed at [facility], [address], [place], [name]   Remove and replace urinary cath (placed > 5 days) then obtain urine culture from new indwelling urinary catheter.     Status: Abnormal (Preliminary result)    Collection Time: 10/06/23  3:36 AM   Specimen: Urine, Catheterized  Result Value Ref Range Status   Specimen Description   Final    URINE, CATHETERIZED Performed at [facility], [address], [name], [name]    Special Requests   Final    NONE Performed at [facility], [address] [place], [name]    Culture (A)  Final    >=100,000 COLONIES/mL PSEUDOMONAS AERUGINOSA SUSCEPTIBILITIES TO FOLLOW Performed at [facility], [address], [place], [place] [address]    Report Status PENDING  Incomplete         Radiology Studies: CT Head Wo Contrast Result Date: 10/05/2023 EXAM: CT HEAD WITHOUT CONTRAST 10/05/2023 06:55:07 PM TECHNIQUE: CT of the head was performed without the administration of intravenous contrast. Automated exposure control, iterative reconstruction, and/or weight based adjustment of the mA/kV was utilized to reduce the radiation dose to as low as reasonably achievable. COMPARISON: Comparison with CT head 09/26/2023. CLINICAL HISTORY: Delirium. Pt coming from home w/ c/o AMS and possible sepsis. Pt was septic 1 week ago and was dc from hospital w/ foley catheter. Family did not know how to empty foley bag so bag had not been emptied x 1 week. Pt more confused than normal. FINDINGS: BRAIN AND VENTRICLES: Chronic microvascular ischemia and generalized atrophy. 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 No evidence of acute infarct. No intracranial hemorrhage or mass effect. No extraaxial fluid collection. Stable ventricular caliber. ORBITS: No acute abnormality. SINUSES: Mucosal thickening in the ethmoid air cells similar to prior. SOFT TISSUES AND SKULL: No acute soft tissue abnormality. No skull fracture. IMPRESSION: 1. No acute intracranial abnormality. Electronically signed by: Norman Gatlin MD 10/05/2023 07:42 PM EDT RP Workstation: [id number]   DG Chest Port 1 View Result  Date:  10/05/2023 CLINICAL DATA:  Questionable sepsis. EXAM: PORTABLE CHEST 1 VIEW COMPARISON:  Chest radiograph dated 03/04/2021. FINDINGS: Minimal left lung base atelectasis. No focal consolidation, pleural effusion, or pneumothorax. The cardiac silhouette is within normal limits. No acute osseous pathology. IMPRESSION: No active disease. Electronically Signed   By: Vanetta Chou M.D.   On: 10/05/2023 18:08     Scheduled Meds:  acetaminophen   1,000 mg Oral TID   Chlorhexidine  Gluconate Cloth  6 each Topical Daily   FLUoxetine   40 mg Oral Daily   ipratropium-albuterol   3 mL Nebulization BID   lamoTRIgine   300 mg Oral BID   levETIRAcetam   2,000 mg Oral BID   sodium chloride  flush  3 mL Intravenous Q12H   tamsulosin   0.4 mg Oral QPC supper   Continuous Infusions:  sodium chloride  100 mL/hr at 10/07/23 9391   ceFEPime  (MAXIPIME ) IV       LOS: 2 days   Almarie KANDICE Hoots, MD 10/07/2023, 10:26 AM

## 2023-10-07 NOTE — Plan of Care (Signed)

## 2023-10-07 NOTE — Plan of Care (Signed)
   Problem: Education: Goal: Knowledge of General Education information will improve Description Including pain rating scale, medication(s)/side effects and non-pharmacologic comfort measures Outcome: Progressing

## 2023-10-07 NOTE — Evaluation (Signed)
 Clinical/Bedside Swallow Evaluation Patient Details  Name: [name] MRN: [id number] Date of Birth: Feb 09, 1961  Today's Date: 10/07/2023 Time: SLP Start Time (ACUTE ONLY): 1515 SLP Stop Time (ACUTE ONLY): 1556 SLP Time Calculation (min) (ACUTE ONLY): 41 min  Past Medical History:  Past Medical History:  Diagnosis Date   Anxiety    Back pain    low back pain/ compressed disk in lower back.    CKD (chronic kidney disease), stage II    Depression    Hypertension    Language barrier 03-01-13   Speaks very little English,primary-Vietnamese [name] Dialect   Seizures (HCC)    due head trauma-seizures are less, but occurs from mild to more severe, which causes agitation to mental state.   TBI (traumatic brain injury) (HCC)    CARE FOR BY BROTHER   Past Surgical History:  Past Surgical History:  Procedure Laterality Date   ABDOMINAL SURGERY     COLONOSCOPY N/A 03/17/2013   Procedure: COLONOSCOPY;  Surgeon: Belvie JONETTA Just, MD;  Location: [facility];  Service: Endoscopy;  Laterality: N/A;   NO PAST SURGERIES     ? one surgery in [place]-? what, has abdominal scar.   HPI:  pt is a 63 yo male adm to [facility] on 10/06/2023 with  63 y.o. male, [name] speaking, with hx of TBI, seizure disorder, debility, HTN, BPH, CKD3, recent ED visit 7/27 with fall out of wheelchair, and on 7/31 for L foot wound discharged on Keflex  for cellulitis, also noted to have retention and urinary catheter place. Brought back in for weakness and fever.     Swallow eval ordered.    Assessment / Plan / Recommendation  Clinical Impression  SLP used interpreter listed on board [name]- however he instructed SlP at end of session that he had [facility] and needed to hang up - Provided SlP with information in how to arrange interpreter and wrote contact numbers for nurse.  Pt's dentition brushed by SlP - no oral pain per pt despite dentition appearing decayed *mostly right lower right. He was willing  to consume po intake including peaches and tea - No s/s of aspiration nor s/s of dysphagia - He does report he cannot feed himself thus he needs to be bed.  SLP questions if he could feed himself finger foods.  Recommend continue diet as tolerated - BRUSH TEETH every NIGHT please.   Pt reports he can't eat - but does not expand on this information - He does not have dysphagia from this SLPs evaluation.  Note Abd image negative.  Thanks for this order.  RN reports pt did much better swallowing his pills this afternoon than this morning, she stated he coughed with trying to swallowing pills.  Tonight tolerated well without any issues.  Suspect this was due to mentation. SLP Visit Diagnosis: Dysphagia, unspecified (R13.10)    Aspiration Risk  Mild aspiration risk;Other (comment) (due to needing to be fed)    Diet Recommendation Regular;Thin liquid    Liquid Administration via: Cup;Straw Medication Administration: Whole meds with liquid Supervision: Staff to assist with self feeding Compensations: Slow rate;Small sips/bites Postural Changes: Seated upright at 90 degrees    Other  Recommendations Oral Care Recommendations: Oral care BID     Assistance Recommended at Discharge  N/a  Functional Status Assessment Patient has not had a recent decline in their functional status  Frequency and Duration     N/a       Prognosis  N/a    Swallow Study   General Date of Onset: 10/07/23 HPI: pt is a 63 yo male adm to [facility] on 10/06/2023 with  63 y.o. male, [name] speaking, with hx of TBI, seizure disorder, debility, HTN, BPH, CKD3, recent ED visit 7/27 with fall out of wheelchair, and on 7/31 for L foot wound discharged on Keflex  for cellulitis, also noted to have retention and urinary catheter place. Brought back in for weakness and fever.     Swallow eval ordered. Type of Study: Bedside Swallow Evaluation Diet Prior to this Study: Regular;Thin liquids (Level 0) Temperature Spikes Noted:  No Respiratory Status: Room air History of Recent Intubation: No Behavior/Cognition: Alert;Cooperative;Other (Comment) (at times pt does not answer questions correctly even with interpreter) Oral Cavity Assessment: Dry;Other (comment) (brushed teeth extensively) Oral Cavity - Dentition: Adequate natural dentition;Other (Comment) (decayed dentition, right lower and posterior, ? oral tori) Vision: Impaired for self-feeding Self-Feeding Abilities: Other (Comment) (pt said he could not feed himself) Patient Positioning: Upright in bed Baseline Vocal Quality: Normal Volitional Cough: Weak Volitional Swallow: Unable to elicit    Oral/Motor/Sensory Function Overall Oral Motor/Sensory Function: Generalized oral weakness   Ice Chips Ice chips: Not tested   Thin Liquid Thin Liquid: Within functional limits Presentation: Cup;Spoon    Nectar Thick Nectar Thick Liquid: Not tested   Honey Thick Honey Thick Liquid: Not tested   Puree Puree: Not tested   Solid     Solid: Within functional limits Other Comments: peaches - adhered to right lower tooth -  removed with liquids      [name] 10/07/2023,4:18 PM  Damon POUR, MS [facility] SLP Acute Rehab Services Office [phone]

## 2023-10-08 ENCOUNTER — Inpatient Hospital Stay (HOSPITAL_COMMUNITY)

## 2023-10-08 DIAGNOSIS — M5146 Schmorl's nodes, lumbar region: Secondary | ICD-10-CM

## 2023-10-08 DIAGNOSIS — S32018A Other fracture of first lumbar vertebra, initial encounter for closed fracture: Secondary | ICD-10-CM | POA: Diagnosis not present

## 2023-10-08 DIAGNOSIS — N3001 Acute cystitis with hematuria: Secondary | ICD-10-CM | POA: Diagnosis not present

## 2023-10-08 LAB — COMPREHENSIVE METABOLIC PANEL WITH GFR
ALT: 12 U/L (ref 0–44)
AST: 21 U/L (ref 15–41)
Albumin: 2.7 g/dL — ABNORMAL LOW (ref 3.5–5.0)
Alkaline Phosphatase: 103 U/L (ref 38–126)
Anion gap: 10 (ref 5–15)
BUN: 19 mg/dL (ref 8–23)
CO2: 21 mmol/L — ABNORMAL LOW (ref 22–32)
Calcium: 8.3 mg/dL — ABNORMAL LOW (ref 8.9–10.3)
Chloride: 108 mmol/L (ref 98–111)
Creatinine, Ser: 1.35 mg/dL — ABNORMAL HIGH (ref 0.61–1.24)
GFR, Estimated: 58 mL/min — ABNORMAL LOW (ref >60)
Glucose, Bld: 118 mg/dL — ABNORMAL HIGH (ref 70–99)
Potassium: 3.6 mmol/L (ref 3.5–5.1)
Sodium: 139 mmol/L (ref 135–145)
Total Bilirubin: 0.4 mg/dL (ref 0.0–1.2)
Total Protein: 6.4 g/dL — ABNORMAL LOW (ref 6.5–8.1)

## 2023-10-08 LAB — CBC
HCT: 35.7 % — ABNORMAL LOW (ref 39.0–52.0)
Hemoglobin: 11.4 g/dL — ABNORMAL LOW (ref 13.0–17.0)
MCH: 30.1 pg (ref 26.0–34.0)
MCHC: 31.9 g/dL (ref 30.0–36.0)
MCV: 94.2 fL (ref 80.0–100.0)
Platelets: 236 K/uL (ref 150–400)
RBC: 3.79 MIL/uL — ABNORMAL LOW (ref 4.22–5.81)
RDW: 14.5 % (ref 11.5–15.5)
WBC: 15.2 K/uL — ABNORMAL HIGH (ref 4.0–10.5)
nRBC: 0 % (ref 0.0–0.2)

## 2023-10-08 LAB — URINE CULTURE: Culture: >=100000 — AB

## 2023-10-08 LAB — PHOSPHORUS: Phosphorus: 2.9 mg/dL (ref 2.5–4.6)

## 2023-10-08 LAB — MAGNESIUM: Magnesium: 2 mg/dL (ref 1.7–2.4)

## 2023-10-08 MED ORDER — LACTULOSE 10 GM/15ML PO SOLN
20.0000 g | Freq: Three times a day (TID) | ORAL | Status: AC
  Administered 2023-10-08 (×2): 20 g via ORAL
  Filled 2023-10-08 (×3): qty 30

## 2023-10-08 MED ORDER — LIDOCAINE 5 % EX PTCH
1.0000 | MEDICATED_PATCH | Freq: Every day | CUTANEOUS | Status: DC
  Administered 2023-10-08 – 2023-10-10 (×3): 1 via TRANSDERMAL
  Filled 2023-10-08 (×3): qty 1

## 2023-10-08 MED ORDER — GABAPENTIN 100 MG PO CAPS
100.0000 mg | ORAL_CAPSULE | Freq: Two times a day (BID) | ORAL | Status: DC
  Administered 2023-10-08 – 2023-10-09 (×2): 100 mg via ORAL
  Filled 2023-10-08 (×3): qty 1

## 2023-10-08 MED ORDER — SENNOSIDES-DOCUSATE SODIUM 8.6-50 MG PO TABS
3.0000 | ORAL_TABLET | Freq: Every day | ORAL | Status: DC
  Administered 2023-10-08 – 2023-10-09 (×2): 3 via ORAL
  Filled 2023-10-08 (×2): qty 3

## 2023-10-08 NOTE — Progress Notes (Signed)
 PT Cancellation Note  Patient Details Name: [name] MRN: [id number] DOB: 1960/08/28   Cancelled Treatment:    Reason Eval/Treat Not Completed: Other (comment). In person interpreter present in room, attempted therapy session but pt states many times he does not want physical therapy due to pain all over, I can't move my arms, I want to go up and pointed to sky. Notified RN, MD and TOC of session and pt desires. Will continue to follow for acute PT as appropriate and will sign off if pt wishes to terminate therapy services.   Tori Torrie Lafavor PT, DPT 10/08/23, 10:03 AM

## 2023-10-08 NOTE — Progress Notes (Signed)
 PROGRESS NOTE    [name]  FMW:[id number] DOB: 1960/09/16 DOA: 10/05/2023 PCP: Damon Atlas, MD   Brief Narrative:  63 y.o. male, [name] speaking, with hx of TBI, seizure disorder, debility, HTN, BPH, CKD3, recent ED visit 7/27 with fall out of wheelchair, and on 7/31 for L foot wound discharged on Keflex  for cellulitis, also noted to have retention and urinary catheter place. Brought back in for weakness and fever.   Per RN note:  Home visit following phone call from brother [name] that he would not be able to get patient to appointment with [facility] tomorrow morning because he he unable to get up from floor where he has been sleeping since last ED visit.  When he tries to walk he become shakey and falls.  He is taking antibiotic (Keflex ) as ordered after last ED visit.  When CN arrived he was very lethargic but did respond.  Temp was 102.8 (temporal), pulse 84 and bounding, BP 120/74 and respirations 84 and labored.  Could not express pain except stated his legs were numb,  Brother has been emptying catheter bag since instructed how  on 10/01/2023 by CN.  We called 911 to have patient re-evaluated due to weakness, fever and labored breathing.  Damon Rummer RN, Congregational Nurse [phone]   Per ED nurse family did not know how to empty Foley bag so bag had not been emptied for the weekend he was admitted with confusion and fever.  Assessment & Plan:   Principal Problem:   Sepsis (HCC) Active Problems:   Urinary tract infection   Sepsis present on admission secondary to Pseudomonas UTI.  He was admitted with a fever of 102 tachypnea and leukocytosis.  Urine culture shows 100,000 colonies of Pseudomonas sensitive to cefepime .  Change antibiotics to cefepime  follow-up final culture.  Chest x-ray negative.  CT head no acute findings. Leukocytosis improved to 15 from 32 on admission. Lactic acidosis resolved with fluids and antibiotics.   Back pain CT of the lumbar spine  shows compression fractures of lumbar spine which was also present in December 2024.  Patient has had falls at home prior to admission to hospital. Will start gabapentin , lidocaine  patch, Flexeril .  Constipation he told the translator that he has not had a bowel movement in 3 weeks and has complaints of abdominal distention discomfort and pain and poor appetite.  KUB shows gaseous bowels otherwise no acute findings. Will start him on a bowel regimen with Dulcolax MiraLAX  lactulose .  Generalized weakness -PT/OT evaluation he refused PT evaluation today.   Hypokalemia/hypophosphatemia Resolved.   Chronic medical problems: History of TBI: Noted Seizure disorder: Continue home lamotrigine  and Keppra  Hypertension: Not on antihypertensives at time of admission BPH: Continue home Flomax  CKD 3a: Baseline creatinine 1.4-1.5   Estimated body mass index is 31.62 kg/m as calculated from the following:   Height as of this encounter: 5' 5 (1.651 m).   Weight as of this encounter: 86.2 kg.  DVT prophylaxis: scd Code Status:full Family Communication: dw brother  Disposition Plan:  Status is: Inpatient Remains inpatient appropriate because: acute illness   Consultants: none  Procedures: none Antimicrobials: Anti-infectives (From admission, onward)    Start     Dose/Rate Route Frequency Ordered Stop   10/07/23 1100  ceFEPIme  (MAXIPIME ) 2 g in sodium chloride  0.9 % 100 mL IVPB        2 g 200 mL/hr over 30 Minutes Intravenous Every 12 hours 10/07/23 1007     10/06/23 2130  cefTRIAXone  (ROCEPHIN )  2 g in sodium chloride  0.9 % 100 mL IVPB  Status:  Discontinued        2 g 200 mL/hr over 30 Minutes Intravenous Every 24 hours 10/05/23 2147 10/07/23 1007   10/05/23 2130  cefTRIAXone  (ROCEPHIN ) 2 g in sodium chloride  0.9 % 100 mL IVPB        2 g 200 mL/hr over 30 Minutes Intravenous  Once 10/05/23 2119 10/05/23 2159        Subjective: Seen the patient with translator in the room also  chaplain in the room.  Patient lives at home with his brother who takes care of him.  He has not had a bowel movement for 3 weeks.  Complains of abdominal distention pain back pain.  Cannot walk due to back pain.  Refused therapy today.  CODE STATUS discussed with the patient he wants to be DNR/DNI.  X-rays of the hip and the pelvis shows no acute findings.  CT of the lumbar spine shows 2 compression fractures in the lumbar spine.  Objective: Vitals:   10/07/23 2006 10/08/23 0159 10/08/23 0627 10/08/23 1409  BP: (!) 92/57 107/61 130/72 139/75  Pulse: 76 69 71 74  Resp: 20 20 20 20   Temp: 97.6 F (36.4 C) 97.6 F (36.4 C) 98.5 F (36.9 C) 98.1 F (36.7 C)  TempSrc:  Axillary Oral   SpO2: 98% 98%  99%  Weight:      Height:        Intake/Output Summary (Last 24 hours) at 10/08/2023 1412 Last data filed at 10/08/2023 0624 Gross per 24 hour  Intake 360 ml  Output 975 ml  Net -615 ml   Filed Weights   10/05/23 1951  Weight: 86.2 kg    Examination:  General exam: Appears chronically ill Respiratory system: Clear to auscultation. Respiratory effort normal. Cardiovascular system: Regular Gastrointestinal system: Distended soft generalized tenderness no rebound Central nervous system: Awake. Extremities: No edema    Data Reviewed: I have personally reviewed following labs and imaging studies  CBC: Recent Labs  Lab 10/05/23 1701 10/05/23 2011 10/06/23 1428 10/07/23 0533 10/08/23 0451  WBC 23.6*  --  32.6* 22.8* 15.2*  NEUTROABS 20.2*  --   --   --   --   HGB 12.8* 12.9* 13.1 12.2* 11.4*  HCT 40.0 38.0* 41.3 37.8* 35.7*  MCV 91.7  --  94.1 94.0 94.2  PLT 273  --  216 184 236   Basic Metabolic Panel: Recent Labs  Lab 10/05/23 1952 10/05/23 2011 10/06/23 0742 10/07/23 0533 10/08/23 0451  NA 137 138 133* 139 139  K 3.3* 3.3* 3.4* 3.9 3.6  CL 103 102 98 106 108  CO2 25  --  22 23 21*  GLUCOSE 121* 121* 97 96 118*  BUN 17 16 18 17 19   CREATININE 1.76* 1.70* 1.74*  1.54* 1.35*  CALCIUM 8.0*  --  8.2* 8.4* 8.3*  MG  --   --  1.7 1.8 2.0  PHOS  --   --  1.2* 2.8 2.9   GFR: Estimated Creatinine Clearance: 55.1 mL/min (A) (by C-G formula based on SCr of 1.35 mg/dL (H)). Liver Function Tests: Recent Labs  Lab 10/05/23 1952 10/07/23 0533 10/08/23 0451  AST 22 34 21  ALT 10 12 12   ALKPHOS 76 86 103  BILITOT 0.7 0.4 0.4  PROT 6.8 6.6 6.4*  ALBUMIN 3.1* 3.1* 2.7*   No results for input(s): LIPASE, AMYLASE in the last 168 hours. No results for input(s): AMMONIA  in the last 168 hours. Coagulation Profile: Recent Labs  Lab 10/05/23 1952  INR 1.1   Cardiac Enzymes: No results for input(s): CKTOTAL, CKMB, CKMBINDEX, TROPONINI in the last 168 hours. BNP (last 3 results) No results for input(s): PROBNP in the last 8760 hours. HbA1C: No results for input(s): HGBA1C in the last 72 hours. CBG: No results for input(s): GLUCAP in the last 168 hours.  Lipid Profile: No results for input(s): CHOL, HDL, LDLCALC, TRIG, CHOLHDL, LDLDIRECT in the last 72 hours. Thyroid  Function Tests: Recent Labs    10/06/23 0742  TSH 0.955   Anemia Panel: No results for input(s): VITAMINB12, FOLATE, FERRITIN, TIBC, IRON, RETICCTPCT in the last 72 hours. Sepsis Labs: Recent Labs  Lab 10/05/23 1710 10/05/23 2012  LATICACIDVEN 2.1* 1.0    Recent Results (from the past 240 hours)  Blood Culture (routine x 2)     Status: None (Preliminary result)   Collection Time: 10/05/23  5:01 PM   Specimen: BLOOD  Result Value Ref Range Status   Specimen Description   Final    BLOOD LEFT ANTECUBITAL Performed at [facility], [address] [place], [place] [address]    Special Requests   Final    BOTTLES DRAWN AEROBIC AND ANAEROBIC Blood Culture results may not be optimal due to an inadequate volume of blood received in culture bottles Performed at [facility], [address]  [place], [place] [address]    Culture   Final    NO GROWTH 3 DAYS Performed at [facility], [address] [place], [place] [address]    Report Status PENDING  Incomplete  Urine Culture     Status: Abnormal   Collection Time: 10/05/23  8:06 PM   Specimen: Urine, Random  Result Value Ref Range Status   Specimen Description   Final    URINE, RANDOM Performed at [facility], [address], [place], [place] [address]    Special Requests   Final    NONE Reflexed from [id number] Performed at [facility], [address], [place], [place] [address]    Culture >=100,000 COLONIES/mL PSEUDOMONAS AERUGINOSA (A)  Final   Report Status 10/07/2023 FINAL  Final   Organism ID, Bacteria PSEUDOMONAS AERUGINOSA (A)  Final      Susceptibility   Pseudomonas aeruginosa - MIC*    CEFTAZIDIME 4 SENSITIVE Sensitive     CIPROFLOXACIN <=0.25 SENSITIVE Sensitive     GENTAMICIN 2 SENSITIVE Sensitive     IMIPENEM 2 SENSITIVE Sensitive     PIP/TAZO 8 SENSITIVE Sensitive ug/mL    CEFEPIME  2 SENSITIVE Sensitive     * >=100,000 COLONIES/mL PSEUDOMONAS AERUGINOSA  Resp panel by RT-PCR (RSV, Flu A&B, Covid) Anterior Nasal Swab     Status: None   Collection Time: 10/05/23 11:33 PM   Specimen: Anterior Nasal Swab  Result Value Ref Range Status   SARS Coronavirus 2 by RT PCR NEGATIVE NEGATIVE Final    Comment: (NOTE) SARS-CoV-2 target nucleic acids are NOT DETECTED.  The SARS-CoV-2 RNA is generally detectable in upper respiratory specimens during the acute phase of infection. The lowest concentration of SARS-CoV-2 viral copies this assay can detect is 138 copies/mL. A negative result does not preclude SARS-Cov-2 infection and should not be used as the sole basis for treatment or other patient management decisions. A negative result may occur with  improper specimen collection/handling, submission of specimen other than nasopharyngeal swab, presence of viral mutation(s)  within  the areas targeted by this assay, and inadequate number of viral copies(<138 copies/mL). A negative result must be combined with clinical observations, patient history, and epidemiological information. The expected result is Negative.  Fact Sheet for Patients:  BloggerCourse.com  Fact Sheet for Healthcare Providers:  SeriousBroker.it  This test is no t yet approved or cleared by the [place]  FDA and  has been authorized for detection and/or diagnosis of SARS-CoV-2 by FDA under an Emergency Use Authorization (EUA). This EUA will remain  in effect (meaning this test can be used) for the duration of the COVID-19 declaration under Section 564(b)(1) of the Act, 21 U.S.C.section 360bbb-3(b)(1), unless the authorization is terminated  or revoked sooner.       Influenza A by PCR NEGATIVE NEGATIVE Final   Influenza B by PCR NEGATIVE NEGATIVE Final    Comment: (NOTE) The Xpert Xpress SARS-CoV-2/FLU/RSV plus assay is intended as an aid in the diagnosis of influenza from Nasopharyngeal swab specimens and should not be used as a sole basis for treatment. Nasal washings and aspirates are unacceptable for Xpert Xpress SARS-CoV-2/FLU/RSV testing.  Fact Sheet for Patients: BloggerCourse.com  Fact Sheet for Healthcare Providers: SeriousBroker.it  This test is not yet approved or cleared by the [place]  FDA and has been authorized for detection and/or diagnosis of SARS-CoV-2 by FDA under an Emergency Use Authorization (EUA). This EUA will remain in effect (meaning this test can be used) for the duration of the COVID-19 declaration under Section 564(b)(1) of the Act, 21 U.S.C. section 360bbb-3(b)(1), unless the authorization is terminated or revoked.     Resp Syncytial Virus by PCR NEGATIVE NEGATIVE Final    Comment: (NOTE) Fact Sheet for  Patients: BloggerCourse.com  Fact Sheet for Healthcare Providers: SeriousBroker.it  This test is not yet approved or cleared by the [place]  FDA and has been authorized for detection and/or diagnosis of SARS-CoV-2 by FDA under an Emergency Use Authorization (EUA). This EUA will remain in effect (meaning this test can be used) for the duration of the COVID-19 declaration under Section 564(b)(1) of the Act, 21 U.S.C. section 360bbb-3(b)(1), unless the authorization is terminated or revoked.  Performed at [facility], [address] [place], [place] [address]   Remove and replace urinary cath (placed > 5 days) then obtain urine culture from new indwelling urinary catheter.     Status: Abnormal   Collection Time: 10/06/23  3:36 AM   Specimen: Urine, Catheterized  Result Value Ref Range Status   Specimen Description   Final    URINE, CATHETERIZED Performed at [facility], [address], [place], [place] [address]    Special Requests   Final    NONE Performed at [facility], [address], [place], [place] [address]    Culture >=100,000 COLONIES/mL PSEUDOMONAS AERUGINOSA (A)  Final   Report Status 10/08/2023 FINAL  Final   Organism ID, Bacteria PSEUDOMONAS AERUGINOSA (A)  Final      Susceptibility   Pseudomonas aeruginosa - MIC*    CEFTAZIDIME 4 SENSITIVE Sensitive     CIPROFLOXACIN <=0.25 SENSITIVE Sensitive     GENTAMICIN 2 SENSITIVE Sensitive     IMIPENEM 2 SENSITIVE Sensitive     PIP/TAZO 8 SENSITIVE Sensitive ug/mL    CEFEPIME  2 SENSITIVE Sensitive     * >=100,000 COLONIES/mL PSEUDOMONAS AERUGINOSA         Radiology Studies: DG HIPS BILAT WITH PELVIS 2V Result Date: 10/08/2023 CLINICAL DATA:  Pain. EXAM: DG HIP (WITH  OR WITHOUT PELVIS) 2V BILAT COMPARISON:  02/13/2023. FINDINGS: There is no evidence of hip fracture or dislocation. Minimal joint space  narrowing of the bilateral hips. Sacroiliac joints and pubic symphysis are anatomically aligned. IMPRESSION: 1. No acute osseous abnormality. 2. Minimal joint space narrowing of the bilateral hips. Electronically Signed   By: Harrietta Sherry M.D.   On: 10/08/2023 13:55   CT LUMBAR SPINE WO CONTRAST Result Date: 10/08/2023 CLINICAL DATA:  Lumbar compression fracture EXAM: CT LUMBAR SPINE WITHOUT CONTRAST TECHNIQUE: Multidetector CT imaging of the lumbar spine was performed without intravenous contrast administration. Multiplanar CT image reconstructions were also generated. RADIATION DOSE REDUCTION: This exam was performed according to the departmental dose-optimization program which includes automated exposure control, adjustment of the mA and/or kV according to patient size and/or use of iterative reconstruction technique. COMPARISON:  MRI February 13, 2023 FINDINGS: Segmentation: 5 lumbar type vertebrae. Alignment: Normal. Vertebrae: There is a mild compression deformity/Schmorl's node in the superior endplate of L4 with sclerosis. Mild chronic compression fracture of L1 Paraspinal and other soft tissues: There is bibasilar atelectasis. Disc levels: L1-L2: Normal L2-L3: There is a mild disc bulge with mild spinal stenosis L3-L4: There is a mild disc bulge with mild spinal stenosis L4-L5: Normal L5-S1: Normal IMPRESSION: Mild chronic compression fracture/Schmorl's node of the superior endplate of L4. Mild chronic compression fracture of the superior endplate of L1. These are unchanged from the previous MRI February 13, 2023. Electronically Signed   By: Nancyann Burns M.D.   On: 10/08/2023 13:03   DG Abd 1 View Result Date: 10/07/2023 CLINICAL DATA:  Abdominal pain EXAM: ABDOMEN - 1 VIEW COMPARISON:  None Available. FINDINGS: Scattered large and small bowel gas is noted. No free air is seen. No abnormal mass or abnormal calcifications are noted. No acute bony abnormality is seen IMPRESSION: No acute abnormality  noted. Electronically Signed   By: Oneil Devonshire M.D.   On: 10/07/2023 11:51     Scheduled Meds:  acetaminophen   1,000 mg Oral TID   Chlorhexidine  Gluconate Cloth  6 each Topical Daily   FLUoxetine   40 mg Oral Daily   lamoTRIgine   300 mg Oral BID   levETIRAcetam   2,000 mg Oral BID   midodrine   5 mg Oral BID WC   sodium chloride  flush  3 mL Intravenous Q12H   tamsulosin   0.4 mg Oral QPC supper   Continuous Infusions:  sodium chloride  100 mL/hr at 10/08/23 1249   ceFEPime  (MAXIPIME ) IV 2 g (10/08/23 1250)     LOS: 3 days   Almarie KANDICE Hoots, MD 10/08/2023, 2:12 PM

## 2023-10-08 NOTE — Progress Notes (Signed)
 Called [employer] at [phone], spoke with Ris[place] at [employer] for interpretation. Patient refused all medications and food. Stated he [place] back to [place] and just die. Patient does not want to be in the hospital anymore due to pain.

## 2023-10-08 NOTE — Plan of Care (Signed)

## 2023-10-08 NOTE — Progress Notes (Signed)
   10/08/23 1050  Spiritual Encounters  Type of Visit Initial  Care provided to: Patient  [place] partners present during encounter Other (comment);Physician (interpreter)  Referral source Patient request  Reason for visit Routine spiritual support  OnCall Visit No   I responded to a page to offer support and prayer to Mr. Brendon Niday. At time of visit Mr. Brink was in bed, experiencing some discomfort. At time of visit patient was being evaluated by Dr. Will. Also at bedside was [facility] interpreter, Mr. Y'Hin. Notably, Mr. Y'Hin is also well known to patient having been his minister and knowing patient since 2.  I provided non-anxious, compassionate presence. I sought to understand Mr. Kuznicki's distress. Due to presence of UTI and extreme physical discomfort due to inability to move bowels over past 3wks, this was causing Mr. Acevedo to experience some emotional and psychic distress and possible confusion. I offered hospitality, assisting Mr. Hanf with water as his mouth was obviously dry. I offered words of encouragement and relational support along with Mr. Y'Hin. I offered prayer with patient consent. I included NT in conversation to help make needs better known.  Xzaria Teo L. Fredrica, M.Div [phone]

## 2023-10-09 DIAGNOSIS — N3001 Acute cystitis with hematuria: Secondary | ICD-10-CM | POA: Diagnosis not present

## 2023-10-09 LAB — CBC
HCT: 38.8 % — ABNORMAL LOW (ref 39.0–52.0)
Hemoglobin: 12.5 g/dL — ABNORMAL LOW (ref 13.0–17.0)
MCH: 29.2 pg (ref 26.0–34.0)
MCHC: 32.2 g/dL (ref 30.0–36.0)
MCV: 90.7 fL (ref 80.0–100.0)
Platelets: 242 K/uL (ref 150–400)
RBC: 4.28 MIL/uL (ref 4.22–5.81)
RDW: 14.2 % (ref 11.5–15.5)
WBC: 11.1 K/uL — ABNORMAL HIGH (ref 4.0–10.5)
nRBC: 0 % (ref 0.0–0.2)

## 2023-10-09 LAB — COMPREHENSIVE METABOLIC PANEL WITH GFR
ALT: 14 U/L (ref 0–44)
AST: 25 U/L (ref 15–41)
Albumin: 2.7 g/dL — ABNORMAL LOW (ref 3.5–5.0)
Alkaline Phosphatase: 128 U/L — ABNORMAL HIGH (ref 38–126)
Anion gap: 12 (ref 5–15)
BUN: 13 mg/dL (ref 8–23)
CO2: 21 mmol/L — ABNORMAL LOW (ref 22–32)
Calcium: 8.3 mg/dL — ABNORMAL LOW (ref 8.9–10.3)
Chloride: 106 mmol/L (ref 98–111)
Creatinine, Ser: 1.23 mg/dL (ref 0.61–1.24)
GFR, Estimated: >60 mL/min (ref >60)
Glucose, Bld: 97 mg/dL (ref 70–99)
Potassium: 3.9 mmol/L (ref 3.5–5.1)
Sodium: 139 mmol/L (ref 135–145)
Total Bilirubin: 0.5 mg/dL (ref 0.0–1.2)
Total Protein: 6.4 g/dL — ABNORMAL LOW (ref 6.5–8.1)

## 2023-10-09 LAB — MAGNESIUM: Magnesium: 2 mg/dL (ref 1.7–2.4)

## 2023-10-09 LAB — PHOSPHORUS: Phosphorus: 3 mg/dL (ref 2.5–4.6)

## 2023-10-09 MED ORDER — ENSURE PLUS HIGH PROTEIN PO LIQD
237.0000 mL | Freq: Two times a day (BID) | ORAL | Status: DC
  Administered 2023-10-09 – 2023-10-11 (×4): 237 mL via ORAL

## 2023-10-09 MED ORDER — GABAPENTIN 100 MG PO CAPS
100.0000 mg | ORAL_CAPSULE | Freq: Three times a day (TID) | ORAL | Status: DC
  Administered 2023-10-09 – 2023-10-11 (×7): 100 mg via ORAL
  Filled 2023-10-09 (×6): qty 1

## 2023-10-09 NOTE — Progress Notes (Signed)
 PROGRESS NOTE    [name]  FMW:[id number] DOB: Apr 30, 1960 DOA: 10/05/2023 PCP: Shelda Atlas, MD   Brief Narrative:  63 y.o. male, [name] speaking, with hx of TBI, seizure disorder, debility, HTN, BPH, CKD3, recent ED visit 7/27 with fall out of wheelchair, and on 7/31 for L foot wound discharged on Keflex  for cellulitis, also noted to have retention and urinary catheter place. Brought back in for weakness and fever.   Per RN note:  Home visit following phone call from brother y'Phi that he would not be able to get patient to appointment with [facility] tomorrow morning because he he unable to get up from floor where he has been sleeping since last ED visit.  When he tries to walk he become shakey and falls.  He is taking antibiotic (Keflex ) as ordered after last ED visit.  When CN arrived he was very lethargic but did respond.  Temp was 102.8 (temporal), pulse 84 and bounding, BP 120/74 and respirations 84 and labored.  Could not express pain except stated his legs were numb,  Brother has been emptying catheter bag since instructed how  on 10/01/2023 by CN.  We called 911 to have patient re-evaluated due to weakness, fever and labored breathing.  Elveria Rummer RN, Congregational Nurse [phone]   Per ED nurse family did not know how to empty Foley bag so bag had not been emptied for the weekend he was admitted with confusion and fever.  Assessment & Plan:   Principal Problem:   Sepsis (HCC) Active Problems:   Urinary tract infection   Sepsis present on admission secondary to Pseudomonas UTI.  He was admitted with a fever of 102 tachypnea and leukocytosis.  Urine culture shows 100,000 colonies of Pseudomonas sensitive to cefepime .  Change antibiotics to cefepime  follow-up final culture.  Chest x-ray negative.  CT head no acute findings. Leukocytosis improved to 15 from 32 on admission. Lactic acidosis resolved with fluids and antibiotics.   Back pain CT of the lumbar spine  shows compression fractures of lumbar spine which was also present in December 2024.  Patient has had falls at home prior to admission to hospital. Will start gabapentin , lidocaine  patch, Flexeril .  Constipation he told the translator that he has not had a bowel movement in 3 weeks and has complaints of abdominal distention discomfort and pain and poor appetite.  KUB shows gaseous bowels otherwise no acute findings. Will start him on a bowel regimen with Dulcolax MiraLAX  lactulose .  Generalized weakness -PT/OT evaluation he refused PT evaluation today.   Hypokalemia/hypophosphatemia Resolved.   Chronic medical problems: History of TBI: Noted Seizure disorder: Continue home lamotrigine  and Keppra  Hypertension: Not on antihypertensives at time of admission BPH: Continue home Flomax  CKD 3a: Baseline creatinine 1.4-1.5   Estimated body mass index is 31.62 kg/m as calculated from the following:   Height as of this encounter: 5' 5 (1.651 m).   Weight as of this encounter: 86.2 kg.  DVT prophylaxis: scd Code Status:full Family Communication: dw brother  Disposition Plan:  Status is: Inpatient Remains inpatient appropriate because: acute illness   Consultants: none  Procedures: none Antimicrobials: Anti-infectives (From admission, onward)    Start     Dose/Rate Route Frequency Ordered Stop   10/07/23 1100  ceFEPIme  (MAXIPIME ) 2 g in sodium chloride  0.9 % 100 mL IVPB        2 g 200 mL/hr over 30 Minutes Intravenous Every 12 hours 10/07/23 1007     10/06/23 2130  cefTRIAXone  (ROCEPHIN )  2 g in sodium chloride  0.9 % 100 mL IVPB  Status:  Discontinued        2 g 200 mL/hr over 30 Minutes Intravenous Every 24 hours 10/05/23 2147 10/07/23 1007   10/05/23 2130  cefTRIAXone  (ROCEPHIN ) 2 g in sodium chloride  0.9 % 100 mL IVPB        2 g 200 mL/hr over 30 Minutes Intravenous  Once 10/05/23 2119 10/05/23 2159        Subjective: Seen the patient with translator in the room also  chaplain in the room.  Patient lives at home with his brother who takes care of him.  He has not had a bowel movement for 3 weeks.  Complains of abdominal distention pain back pain.  Cannot walk due to back pain.  Refused therapy today.  CODE STATUS discussed with the patient he wants to be DNR/DNI.  X-rays of the hip and the pelvis shows no acute findings.  CT of the lumbar spine shows 2 compression fractures in the lumbar spine.  Objective: Vitals:   10/08/23 1409 10/08/23 2029 10/09/23 0444 10/09/23 0837  BP: 139/75 123/74 (!) 153/90 (!) 151/82  Pulse: 74 72 73 68  Resp: 20 20 20 20   Temp: 98.1 F (36.7 C) 98.3 F (36.8 C) 98.1 F (36.7 C) 97.8 F (36.6 C)  TempSrc:  Oral Oral Oral  SpO2: 99% 97% 98%   Weight:      Height:        Intake/Output Summary (Last 24 hours) at 10/09/2023 1504 Last data filed at 10/09/2023 1032 Gross per 24 hour  Intake 600 ml  Output 2750 ml  Net -2150 ml   Filed Weights   10/05/23 1951  Weight: 86.2 kg    Examination:  General exam: Appears chronically ill Respiratory system: Clear to auscultation. Respiratory effort normal. Cardiovascular system: Regular Gastrointestinal system: Distended soft generalized tenderness no rebound Central nervous system: Awake. Extremities: No edema    Data Reviewed: I have personally reviewed following labs and imaging studies  CBC: Recent Labs  Lab 10/05/23 1701 10/05/23 2011 10/06/23 1428 10/07/23 0533 10/08/23 0451 10/09/23 0643  WBC 23.6*  --  32.6* 22.8* 15.2* 11.1*  NEUTROABS 20.2*  --   --   --   --   --   HGB 12.8* 12.9* 13.1 12.2* 11.4* 12.5*  HCT 40.0 38.0* 41.3 37.8* 35.7* 38.8*  MCV 91.7  --  94.1 94.0 94.2 90.7  PLT 273  --  216 184 236 242   Basic Metabolic Panel: Recent Labs  Lab 10/05/23 1952 10/05/23 2011 10/06/23 0742 10/07/23 0533 10/08/23 0451 10/09/23 0643  NA 137 138 133* 139 139 139  K 3.3* 3.3* 3.4* 3.9 3.6 3.9  CL 103 102 98 106 108 106  CO2 25  --  22 23 21* 21*   GLUCOSE 121* 121* 97 96 118* 97  BUN 17 16 18 17 19 13   CREATININE 1.76* 1.70* 1.74* 1.54* 1.35* 1.23  CALCIUM 8.0*  --  8.2* 8.4* 8.3* 8.3*  MG  --   --  1.7 1.8 2.0 2.0  PHOS  --   --  1.2* 2.8 2.9 3.0   GFR: Estimated Creatinine Clearance: 60.5 mL/min (by C-G formula based on SCr of 1.23 mg/dL). Liver Function Tests: Recent Labs  Lab 10/05/23 1952 10/07/23 0533 10/08/23 0451 10/09/23 0643  AST 22 34 21 25  ALT 10 12 12 14   ALKPHOS 76 86 103 128*  BILITOT 0.7 0.4 0.4 0.5  PROT 6.8 6.6 6.4* 6.4*  ALBUMIN 3.1* 3.1* 2.7* 2.7*   No results for input(s): LIPASE, AMYLASE in the last 168 hours. No results for input(s): AMMONIA in the last 168 hours. Coagulation Profile: Recent Labs  Lab 10/05/23 1952  INR 1.1   Cardiac Enzymes: No results for input(s): CKTOTAL, CKMB, CKMBINDEX, TROPONINI in the last 168 hours. BNP (last 3 results) No results for input(s): PROBNP in the last 8760 hours. HbA1C: No results for input(s): HGBA1C in the last 72 hours. CBG: No results for input(s): GLUCAP in the last 168 hours.  Lipid Profile: No results for input(s): CHOL, HDL, LDLCALC, TRIG, CHOLHDL, LDLDIRECT in the last 72 hours. Thyroid  Function Tests: No results for input(s): TSH, T4TOTAL, FREET4, T3FREE, THYROIDAB in the last 72 hours.  Anemia Panel: No results for input(s): VITAMINB12, FOLATE, FERRITIN, TIBC, IRON, RETICCTPCT in the last 72 hours. Sepsis Labs: Recent Labs  Lab 10/05/23 1710 10/05/23 2012  LATICACIDVEN 2.1* 1.0    Recent Results (from the past 240 hours)  Blood Culture (routine x 2)     Status: None (Preliminary result)   Collection Time: 10/05/23  5:01 PM   Specimen: BLOOD  Result Value Ref Range Status   Specimen Description   Final    BLOOD LEFT ANTECUBITAL Performed at [facility], [address] [place], [place] [address]    Special Requests   Final    BOTTLES DRAWN AEROBIC  AND ANAEROBIC Blood Culture results may not be optimal due to an inadequate volume of blood received in culture bottles Performed at [facility], [address], [place], [place] [address]    Culture   Final    NO GROWTH 4 DAYS Performed at [facility], [address], [place], [place] [address]    Report Status PENDING  Incomplete  Urine Culture     Status: Abnormal   Collection Time: 10/05/23  8:06 PM   Specimen: Urine, Random  Result Value Ref Range Status   Specimen Description   Final    URINE, RANDOM Performed at [facility], [address], [place], [place] [address]    Special Requests   Final    NONE Reflexed from [id number] Performed at [facility], [address] [place], [place] [address]    Culture >=100,000 COLONIES/mL PSEUDOMONAS AERUGINOSA (A)  Final   Report Status 10/07/2023 FINAL  Final   Organism ID, Bacteria PSEUDOMONAS AERUGINOSA (A)  Final      Susceptibility   Pseudomonas aeruginosa - MIC*    CEFTAZIDIME 4 SENSITIVE Sensitive     CIPROFLOXACIN  <=0.25 SENSITIVE Sensitive     GENTAMICIN 2 SENSITIVE Sensitive     IMIPENEM 2 SENSITIVE Sensitive     PIP/TAZO 8 SENSITIVE Sensitive ug/mL    CEFEPIME  2 SENSITIVE Sensitive     * >=100,000 COLONIES/mL PSEUDOMONAS AERUGINOSA  Resp panel by RT-PCR (RSV, Flu A&B, Covid) Anterior Nasal Swab     Status: None   Collection Time: 10/05/23 11:33 PM   Specimen: Anterior Nasal Swab  Result Value Ref Range Status   SARS Coronavirus 2 by RT PCR NEGATIVE NEGATIVE Final    Comment: (NOTE) SARS-CoV-2 target nucleic acids are NOT DETECTED.  The SARS-CoV-2 RNA is generally detectable in upper respiratory specimens during the acute phase of infection. The lowest concentration of SARS-CoV-2 viral copies this assay can detect is 138 copies/mL. A negative result does not preclude SARS-Cov-2 infection and should not be used as the  sole basis for treatment or other  patient management decisions. A negative result may occur with  improper specimen collection/handling, submission of specimen other than nasopharyngeal swab, presence of viral mutation(s) within the areas targeted by this assay, and inadequate number of viral copies(<138 copies/mL). A negative result must be combined with clinical observations, patient history, and epidemiological information. The expected result is Negative.  Fact Sheet for Patients:  BloggerCourse.com  Fact Sheet for Healthcare Providers:  SeriousBroker.it  This test is no t yet approved or cleared by the [place]  FDA and  has been authorized for detection and/or diagnosis of SARS-CoV-2 by FDA under an Emergency Use Authorization (EUA). This EUA will remain  in effect (meaning this test can be used) for the duration of the COVID-19 declaration under Section 564(b)(1) of the Act, 21 U.S.C.section 360bbb-3(b)(1), unless the authorization is terminated  or revoked sooner.       Influenza A by PCR NEGATIVE NEGATIVE Final   Influenza B by PCR NEGATIVE NEGATIVE Final    Comment: (NOTE) The Xpert Xpress SARS-CoV-2/FLU/RSV plus assay is intended as an aid in the diagnosis of influenza from Nasopharyngeal swab specimens and should not be used as a sole basis for treatment. Nasal washings and aspirates are unacceptable for Xpert Xpress SARS-CoV-2/FLU/RSV testing.  Fact Sheet for Patients: BloggerCourse.com  Fact Sheet for Healthcare Providers: SeriousBroker.it  This test is not yet approved or cleared by the [place]  FDA and has been authorized for detection and/or diagnosis of SARS-CoV-2 by FDA under an Emergency Use Authorization (EUA). This EUA will remain in effect (meaning this test can be used) for the duration of the COVID-19 declaration under Section 564(b)(1) of the Act, 21 U.S.C. section  360bbb-3(b)(1), unless the authorization is terminated or revoked.     Resp Syncytial Virus by PCR NEGATIVE NEGATIVE Final    Comment: (NOTE) Fact Sheet for Patients: BloggerCourse.com  Fact Sheet for Healthcare Providers: SeriousBroker.it  This test is not yet approved or cleared by the [place]  FDA and has been authorized for detection and/or diagnosis of SARS-CoV-2 by FDA under an Emergency Use Authorization (EUA). This EUA will remain in effect (meaning this test can be used) for the duration of the COVID-19 declaration under Section 564(b)(1) of the Act, 21 U.S.C. section 360bbb-3(b)(1), unless the authorization is terminated or revoked.  Performed at [facility], [address] [place], [place] [address]   Remove and replace urinary cath (placed > 5 days) then obtain urine culture from new indwelling urinary catheter.     Status: Abnormal   Collection Time: 10/06/23  3:36 AM   Specimen: Urine, Catheterized  Result Value Ref Range Status   Specimen Description   Final    URINE, CATHETERIZED Performed at [facility], [address] [place], [place] [address]    Special Requests   Final    NONE Performed at [facility], [address], [place], [place] [address]    Culture >=100,000 COLONIES/mL PSEUDOMONAS AERUGINOSA (A)  Final   Report Status 10/08/2023 FINAL  Final   Organism ID, Bacteria PSEUDOMONAS AERUGINOSA (A)  Final      Susceptibility   Pseudomonas aeruginosa - MIC*    CEFTAZIDIME 4 SENSITIVE Sensitive     CIPROFLOXACIN  <=0.25 SENSITIVE Sensitive     GENTAMICIN 2 SENSITIVE Sensitive     IMIPENEM 2 SENSITIVE Sensitive     PIP/TAZO 8 SENSITIVE Sensitive ug/mL    CEFEPIME  2 SENSITIVE Sensitive     * >=  100,000 COLONIES/mL PSEUDOMONAS AERUGINOSA         Radiology Studies: DG HIPS BILAT WITH PELVIS 2V Result Date: 10/08/2023 CLINICAL DATA:   Pain. EXAM: DG HIP (WITH OR WITHOUT PELVIS) 2V BILAT COMPARISON:  02/13/2023. FINDINGS: There is no evidence of hip fracture or dislocation. Minimal joint space narrowing of the bilateral hips. Sacroiliac joints and pubic symphysis are anatomically aligned. IMPRESSION: 1. No acute osseous abnormality. 2. Minimal joint space narrowing of the bilateral hips. Electronically Signed   By: Harrietta Sherry M.D.   On: 10/08/2023 13:55   CT LUMBAR SPINE WO CONTRAST Result Date: 10/08/2023 CLINICAL DATA:  Lumbar compression fracture EXAM: CT LUMBAR SPINE WITHOUT CONTRAST TECHNIQUE: Multidetector CT imaging of the lumbar spine was performed without intravenous contrast administration. Multiplanar CT image reconstructions were also generated. RADIATION DOSE REDUCTION: This exam was performed according to the departmental dose-optimization program which includes automated exposure control, adjustment of the mA and/or kV according to patient size and/or use of iterative reconstruction technique. COMPARISON:  MRI February 13, 2023 FINDINGS: Segmentation: 5 lumbar type vertebrae. Alignment: Normal. Vertebrae: There is a mild compression deformity/Schmorl's node in the superior endplate of L4 with sclerosis. Mild chronic compression fracture of L1 Paraspinal and other soft tissues: There is bibasilar atelectasis. Disc levels: L1-L2: Normal L2-L3: There is a mild disc bulge with mild spinal stenosis L3-L4: There is a mild disc bulge with mild spinal stenosis L4-L5: Normal L5-S1: Normal IMPRESSION: Mild chronic compression fracture/Schmorl's node of the superior endplate of L4. Mild chronic compression fracture of the superior endplate of L1. These are unchanged from the previous MRI February 13, 2023. Electronically Signed   By: Nancyann Burns M.D.   On: 10/08/2023 13:03     Scheduled Meds:  acetaminophen   1,000 mg Oral TID   Chlorhexidine  Gluconate Cloth  6 each Topical Daily   feeding supplement  237 mL Oral BID BM    FLUoxetine   40 mg Oral Daily   gabapentin   100 mg Oral BID   lactulose   20 g Oral TID   lamoTRIgine   300 mg Oral BID   levETIRAcetam   2,000 mg Oral BID   lidocaine   1 patch Transdermal Daily   midodrine   5 mg Oral BID WC   senna-docusate  3 tablet Oral QHS   sodium chloride  flush  3 mL Intravenous Q12H   tamsulosin   0.4 mg Oral QPC supper   Continuous Infusions:  sodium chloride  100 mL/hr at 10/08/23 2316   ceFEPime  (MAXIPIME ) IV 2 g (10/09/23 1017)     LOS: 4 days   Almarie KANDICE Hoots, MD 10/09/2023, 3:04 PM

## 2023-10-09 NOTE — Plan of Care (Signed)
  Problem: Education: Goal: Knowledge of General Education information will improve Description: Including pain rating scale, medication(s)/side effects and non-pharmacologic comfort measures Outcome: Progressing   Problem: Health Behavior/Discharge Planning: Goal: Ability to manage health-related needs will improve Outcome: Progressing   Problem: Clinical Measurements: Goal: Ability to maintain clinical measurements within normal limits will improve Outcome: Progressing Goal: Will remain free from infection Outcome: Progressing Goal: Diagnostic test results will improve Outcome: Progressing Goal: Respiratory complications will improve Outcome: Progressing Goal: Cardiovascular complication will be avoided Outcome: Progressing   Problem: Nutrition: Goal: Adequate nutrition will be maintained Outcome: Progressing   Problem: Coping: Goal: Level of anxiety will decrease Outcome: Progressing   Problem: Pain Managment: Goal: General experience of comfort will improve and/or be controlled Outcome: Progressing

## 2023-10-09 NOTE — Plan of Care (Signed)
   Problem: Education: Goal: Knowledge of General Education information will improve Description Including pain rating scale, medication(s)/side effects and non-pharmacologic comfort measures Outcome: Progressing

## 2023-10-09 NOTE — Plan of Care (Signed)
   Problem: Education: Goal: Knowledge of General Education information will improve Description Including pain rating scale, medication(s)/side effects and non-pharmacologic comfort measures Outcome: Progressing   Problem: Health Behavior/Discharge Planning: Goal: Ability to manage health-related needs will improve Outcome: Progressing

## 2023-10-10 DIAGNOSIS — R531 Weakness: Secondary | ICD-10-CM

## 2023-10-10 LAB — COMPREHENSIVE METABOLIC PANEL WITH GFR
ALT: 27 U/L (ref 0–44)
AST: 45 U/L — ABNORMAL HIGH (ref 15–41)
Albumin: 2.5 g/dL — ABNORMAL LOW (ref 3.5–5.0)
Alkaline Phosphatase: 128 U/L — ABNORMAL HIGH (ref 38–126)
Anion gap: 10 (ref 5–15)
BUN: 13 mg/dL (ref 8–23)
CO2: 22 mmol/L (ref 22–32)
Calcium: 8.5 mg/dL — ABNORMAL LOW (ref 8.9–10.3)
Chloride: 109 mmol/L (ref 98–111)
Creatinine, Ser: 1.3 mg/dL — ABNORMAL HIGH (ref 0.61–1.24)
GFR, Estimated: >60 mL/min (ref >60)
Glucose, Bld: 93 mg/dL (ref 70–99)
Potassium: 3.6 mmol/L (ref 3.5–5.1)
Sodium: 141 mmol/L (ref 135–145)
Total Bilirubin: 0.5 mg/dL (ref 0.0–1.2)
Total Protein: 6.4 g/dL — ABNORMAL LOW (ref 6.5–8.1)

## 2023-10-10 LAB — CBC
HCT: 36.7 % — ABNORMAL LOW (ref 39.0–52.0)
Hemoglobin: 11.5 g/dL — ABNORMAL LOW (ref 13.0–17.0)
MCH: 29.9 pg (ref 26.0–34.0)
MCHC: 31.3 g/dL (ref 30.0–36.0)
MCV: 95.3 fL (ref 80.0–100.0)
Platelets: 318 K/uL (ref 150–400)
RBC: 3.85 MIL/uL — ABNORMAL LOW (ref 4.22–5.81)
RDW: 14.3 % (ref 11.5–15.5)
WBC: 8.6 K/uL (ref 4.0–10.5)
nRBC: 0 % (ref 0.0–0.2)

## 2023-10-10 LAB — PHOSPHORUS: Phosphorus: 3.3 mg/dL (ref 2.5–4.6)

## 2023-10-10 LAB — MAGNESIUM: Magnesium: 1.9 mg/dL (ref 1.7–2.4)

## 2023-10-10 LAB — CULTURE, BLOOD (ROUTINE X 2): Culture: NO GROWTH

## 2023-10-10 NOTE — Progress Notes (Signed)
 PROGRESS NOTE    [name]  FMW:[id number] DOB: 02-Jun-1960 DOA: 10/05/2023 PCP: Shelda Atlas, MD   Brief Narrative:  63 y.o. male, [name] speaking, with hx of TBI, seizure disorder, debility, HTN, BPH, CKD3, recent ED visit 7/27 with fall out of wheelchair, and on 7/31 for L foot wound discharged on Keflex  for cellulitis, also noted to have retention and urinary catheter place. Brought back in for weakness and fever.   Per RN note:  Home visit following phone call from brother y'Phi that he would not be able to get patient to appointment with [facility] tomorrow morning because he he unable to get up from floor where he has been sleeping since last ED visit.  When he tries to walk he become shakey and falls.  He is taking antibiotic (Keflex ) as ordered after last ED visit.  When CN arrived he was very lethargic but did respond.  Temp was 102.8 (temporal), pulse 84 and bounding, BP 120/74 and respirations 84 and labored.  Could not express pain except stated his legs were numb,  Brother has been emptying catheter bag since instructed how  on 10/01/2023 by CN.  We called 911 to have patient re-evaluated due to weakness, fever and labored breathing.  Elveria Rummer RN, Congregational Nurse [phone]   Per ED nurse family did not know how to empty Foley bag so bag had not been emptied for the weekend he was admitted with confusion and fever.  Assessment & Plan:   Principal Problem:   Sepsis (HCC) Active Problems:   Urinary tract infection   Sepsis present on admission secondary to Pseudomonas UTI.  He was admitted with a fever of 102 tachypnea and leukocytosis.  Urine culture shows 100,000 colonies of Pseudomonas sensitive to cefepime .  Change antibiotics to cefepime  follow-up final culture.  Chest x-ray negative.  CT head no acute findings. Leukocytosis improved to 15 from 32 on admission. Lactic acidosis resolved with fluids and antibiotics.   Back pain CT of the lumbar spine  shows compression fractures of lumbar spine which was also present in December 2024.  Patient has had falls at home prior to admission to hospital. Will start gabapentin , lidocaine  patch, Flexeril .  Constipation he told the translator that he has not had a bowel movement in 3 weeks and has complaints of abdominal distention discomfort and pain and poor appetite.  KUB shows gaseous bowels otherwise no acute findings. Will start him on a bowel regimen with Dulcolax MiraLAX  lactulose .  Generalized weakness -PT/OT evaluation he refused PT evaluation today.   Hypokalemia/hypophosphatemia Resolved.   Chronic medical problems: History of TBI: Noted Seizure disorder: Continue home lamotrigine  and Keppra  Hypertension: Not on antihypertensives at time of admission BPH: Continue home Flomax  CKD 3a: Baseline creatinine 1.4-1.5   Estimated body mass index is 31.62 kg/m as calculated from the following:   Height as of this encounter: 5' 5 (1.651 m).   Weight as of this encounter: 86.2 kg.  DVT prophylaxis: scd Code Status:full Family Communication: dw brother  Disposition Plan:  Status is: Inpatient Remains inpatient appropriate because: acute illness   Consultants: none  Procedures: none Antimicrobials: Anti-infectives (From admission, onward)    Start     Dose/Rate Route Frequency Ordered Stop   10/07/23 1100  ceFEPIme  (MAXIPIME ) 2 g in sodium chloride  0.9 % 100 mL IVPB        2 g 200 mL/hr over 30 Minutes Intravenous Every 12 hours 10/07/23 1007     10/06/23 2130  cefTRIAXone  (ROCEPHIN )  2 g in sodium chloride  0.9 % 100 mL IVPB  Status:  Discontinued        2 g 200 mL/hr over 30 Minutes Intravenous Every 24 hours 10/05/23 2147 10/07/23 1007   10/05/23 2130  cefTRIAXone  (ROCEPHIN ) 2 g in sodium chloride  0.9 % 100 mL IVPB        2 g 200 mL/hr over 30 Minutes Intravenous  Once 10/05/23 2119 10/05/23 2159        Subjective:  Resting in bed in no acute distress he had 2 bowel  movements today  Objective: Vitals:   10/09/23 1620 10/09/23 2030 10/10/23 0412 10/10/23 1341  BP: (!) 142/79 (!) 142/78 (!) 144/83 (!) 147/85  Pulse: 73 80 65 72  Resp: 20 20 20 18   Temp: (!) 97.5 F (36.4 C) 99.1 F (37.3 C) 97.6 F (36.4 C) 98.3 F (36.8 C)  TempSrc: Oral Oral Oral Oral  SpO2: 98% 96% 97% 96%  Weight:      Height:        Intake/Output Summary (Last 24 hours) at 10/10/2023 1401 Last data filed at 10/10/2023 0800 Gross per 24 hour  Intake 7589.99 ml  Output 2050 ml  Net 5539.99 ml   Filed Weights   10/05/23 1951  Weight: 86.2 kg    Examination:  General exam: Appears chronically ill Respiratory system: Clear to auscultation. Respiratory effort normal. Cardiovascular system: Regular Gastrointestinal system: Distended soft generalized tenderness no rebound Central nervous system: Awake. Extremities: No edema    Data Reviewed: I have personally reviewed following labs and imaging studies  CBC: Recent Labs  Lab 10/05/23 1701 10/05/23 2011 10/06/23 1428 10/07/23 0533 10/08/23 0451 10/09/23 0643 10/10/23 0513  WBC 23.6*  --  32.6* 22.8* 15.2* 11.1* 8.6  NEUTROABS 20.2*  --   --   --   --   --   --   HGB 12.8*   < > 13.1 12.2* 11.4* 12.5* 11.5*  HCT 40.0   < > 41.3 37.8* 35.7* 38.8* 36.7*  MCV 91.7  --  94.1 94.0 94.2 90.7 95.3  PLT 273  --  216 184 236 242 318   < > = values in this interval not displayed.   Basic Metabolic Panel: Recent Labs  Lab 10/06/23 0742 10/07/23 0533 10/08/23 0451 10/09/23 0643 10/10/23 0513  NA 133* 139 139 139 141  K 3.4* 3.9 3.6 3.9 3.6  CL 98 106 108 106 109  CO2 22 23 21* 21* 22  GLUCOSE 97 96 118* 97 93  BUN 18 17 19 13 13   CREATININE 1.74* 1.54* 1.35* 1.23 1.30*  CALCIUM 8.2* 8.4* 8.3* 8.3* 8.5*  MG 1.7 1.8 2.0 2.0 1.9  PHOS 1.2* 2.8 2.9 3.0 3.3   GFR: Estimated Creatinine Clearance: 57.2 mL/min (A) (by C-G formula based on SCr of 1.3 mg/dL (H)). Liver Function Tests: Recent Labs  Lab  10/05/23 1952 10/07/23 0533 10/08/23 0451 10/09/23 0643 10/10/23 0513  AST 22 34 21 25 45*  ALT 10 12 12 14 27   ALKPHOS 76 86 103 128* 128*  BILITOT 0.7 0.4 0.4 0.5 0.5  PROT 6.8 6.6 6.4* 6.4* 6.4*  ALBUMIN 3.1* 3.1* 2.7* 2.7* 2.5*   No results for input(s): LIPASE, AMYLASE in the last 168 hours. No results for input(s): AMMONIA in the last 168 hours. Coagulation Profile: Recent Labs  Lab 10/05/23 1952  INR 1.1   Cardiac Enzymes: No results for input(s): CKTOTAL, CKMB, CKMBINDEX, TROPONINI in the last 168 hours. BNP (last  3 results) No results for input(s): PROBNP in the last 8760 hours. HbA1C: No results for input(s): HGBA1C in the last 72 hours. CBG: No results for input(s): GLUCAP in the last 168 hours.  Lipid Profile: No results for input(s): CHOL, HDL, LDLCALC, TRIG, CHOLHDL, LDLDIRECT in the last 72 hours. Thyroid  Function Tests: No results for input(s): TSH, T4TOTAL, FREET4, T3FREE, THYROIDAB in the last 72 hours.  Anemia Panel: No results for input(s): VITAMINB12, FOLATE, FERRITIN, TIBC, IRON, RETICCTPCT in the last 72 hours. Sepsis Labs: Recent Labs  Lab 10/05/23 1710 10/05/23 2012  LATICACIDVEN 2.1* 1.0    Recent Results (from the past 240 hours)  Blood Culture (routine x 2)     Status: None   Collection Time: 10/05/23  5:01 PM   Specimen: BLOOD  Result Value Ref Range Status   Specimen Description   Final    BLOOD LEFT ANTECUBITAL Performed at [facility], [address] [place], [place] [address]    Special Requests   Final    BOTTLES DRAWN AEROBIC AND ANAEROBIC Blood Culture results may not be optimal due to an inadequate volume of blood received in culture bottles Performed at [facility], [address], [place], [place] [address]    Culture   Final    NO GROWTH 5 DAYS Performed at [facility], [address], [place], [place] [address]     Report Status 10/10/2023 FINAL  Final  Urine Culture     Status: Abnormal   Collection Time: 10/05/23  8:06 PM   Specimen: Urine, Random  Result Value Ref Range Status   Specimen Description   Final    URINE, RANDOM Performed at [facility], [address], [place], [place] [address]    Special Requests   Final    NONE Reflexed from [id number] Performed at [facility], [address], [place], [place] [address]    Culture >=100,000 COLONIES/mL PSEUDOMONAS AERUGINOSA (A)  Final   Report Status 10/07/2023 FINAL  Final   Organism ID, Bacteria PSEUDOMONAS AERUGINOSA (A)  Final      Susceptibility   Pseudomonas aeruginosa - MIC*    CEFTAZIDIME 4 SENSITIVE Sensitive     CIPROFLOXACIN  <=0.25 SENSITIVE Sensitive     GENTAMICIN 2 SENSITIVE Sensitive     IMIPENEM 2 SENSITIVE Sensitive     PIP/TAZO 8 SENSITIVE Sensitive ug/mL    CEFEPIME  2 SENSITIVE Sensitive     * >=100,000 COLONIES/mL PSEUDOMONAS AERUGINOSA  Resp panel by RT-PCR (RSV, Flu A&B, Covid) Anterior Nasal Swab     Status: None   Collection Time: 10/05/23 11:33 PM   Specimen: Anterior Nasal Swab  Result Value Ref Range Status   SARS Coronavirus 2 by RT PCR NEGATIVE NEGATIVE Final    Comment: (NOTE) SARS-CoV-2 target nucleic acids are NOT DETECTED.  The SARS-CoV-2 RNA is generally detectable in upper respiratory specimens during the acute phase of infection. The lowest concentration of SARS-CoV-2 viral copies this assay can detect is 138 copies/mL. A negative result does not preclude SARS-Cov-2 infection and should not be used as the sole basis for treatment or other patient management decisions. A negative result may occur with  improper specimen collection/handling, submission of specimen other than nasopharyngeal swab, presence of viral mutation(s) within the areas targeted by this assay, and inadequate number of viral copies(<138 copies/mL). A negative result must be combined  with clinical observations, patient history, and epidemiological information. The expected result is Negative.  Fact Sheet for Patients:  BloggerCourse.com  Fact Sheet for Healthcare Providers:  SeriousBroker.it  This test is no t yet approved or cleared by the [place]  FDA and  has been authorized for detection and/or diagnosis of SARS-CoV-2 by FDA under an Emergency Use Authorization (EUA). This EUA will remain  in effect (meaning this test can be used) for the duration of the COVID-19 declaration under Section 564(b)(1) of the Act, 21 U.S.C.section 360bbb-3(b)(1), unless the authorization is terminated  or revoked sooner.       Influenza A by PCR NEGATIVE NEGATIVE Final   Influenza B by PCR NEGATIVE NEGATIVE Final    Comment: (NOTE) The Xpert Xpress SARS-CoV-2/FLU/RSV plus assay is intended as an aid in the diagnosis of influenza from Nasopharyngeal swab specimens and should not be used as a sole basis for treatment. Nasal washings and aspirates are unacceptable for Xpert Xpress SARS-CoV-2/FLU/RSV testing.  Fact Sheet for Patients: BloggerCourse.com  Fact Sheet for Healthcare Providers: SeriousBroker.it  This test is not yet approved or cleared by the [place]  FDA and has been authorized for detection and/or diagnosis of SARS-CoV-2 by FDA under an Emergency Use Authorization (EUA). This EUA will remain in effect (meaning this test can be used) for the duration of the COVID-19 declaration under Section 564(b)(1) of the Act, 21 U.S.C. section 360bbb-3(b)(1), unless the authorization is terminated or revoked.     Resp Syncytial Virus by PCR NEGATIVE NEGATIVE Final    Comment: (NOTE) Fact Sheet for Patients: BloggerCourse.com  Fact Sheet for Healthcare Providers: SeriousBroker.it  This test is not yet approved  or cleared by the [place]  FDA and has been authorized for detection and/or diagnosis of SARS-CoV-2 by FDA under an Emergency Use Authorization (EUA). This EUA will remain in effect (meaning this test can be used) for the duration of the COVID-19 declaration under Section 564(b)(1) of the Act, 21 U.S.C. section 360bbb-3(b)(1), unless the authorization is terminated or revoked.  Performed at [facility], [address], [place], [place] [address]   Remove and replace urinary cath (placed > 5 days) then obtain urine culture from new indwelling urinary catheter.     Status: Abnormal   Collection Time: 10/06/23  3:36 AM   Specimen: Urine, Catheterized  Result Value Ref Range Status   Specimen Description   Final    URINE, CATHETERIZED Performed at [facility], [address], [place], [place] [address]    Special Requests   Final    NONE Performed at [facility], [address] [place], [place] [address]    Culture >=100,000 COLONIES/mL PSEUDOMONAS AERUGINOSA (A)  Final   Report Status 10/08/2023 FINAL  Final   Organism ID, Bacteria PSEUDOMONAS AERUGINOSA (A)  Final      Susceptibility   Pseudomonas aeruginosa - MIC*    CEFTAZIDIME 4 SENSITIVE Sensitive     CIPROFLOXACIN  <=0.25 SENSITIVE Sensitive     GENTAMICIN 2 SENSITIVE Sensitive     IMIPENEM 2 SENSITIVE Sensitive     PIP/TAZO 8 SENSITIVE Sensitive ug/mL    CEFEPIME  2 SENSITIVE Sensitive     * >=100,000 COLONIES/mL PSEUDOMONAS AERUGINOSA         Radiology Studies: No results found.    Scheduled Meds:  acetaminophen   1,000 mg Oral TID   Chlorhexidine  Gluconate Cloth  6 each Topical Daily   feeding supplement  237 mL Oral BID BM   FLUoxetine   40 mg Oral Daily   gabapentin   100 mg Oral  TID   lamoTRIgine   300 mg Oral BID   levETIRAcetam   2,000 mg Oral BID   lidocaine   1 patch Transdermal Daily   midodrine   5 mg Oral BID WC   senna-docusate  3 tablet  Oral QHS   sodium chloride  flush  3 mL Intravenous Q12H   tamsulosin   0.4 mg Oral QPC supper   Continuous Infusions:  sodium chloride  100 mL/hr at 10/10/23 0529   ceFEPime  (MAXIPIME ) IV 2 g (10/10/23 1142)     LOS: 5 days   Almarie KANDICE Hoots, MD 10/10/2023, 2:01 PM

## 2023-10-11 ENCOUNTER — Other Ambulatory Visit (HOSPITAL_COMMUNITY): Payer: Self-pay

## 2023-10-11 ENCOUNTER — Telehealth: Payer: Self-pay

## 2023-10-11 DIAGNOSIS — N3001 Acute cystitis with hematuria: Secondary | ICD-10-CM | POA: Diagnosis not present

## 2023-10-11 MED ORDER — CIPROFLOXACIN HCL 500 MG PO TABS
500.0000 mg | ORAL_TABLET | Freq: Two times a day (BID) | ORAL | 0 refills | Status: AC
  Filled 2023-10-11: qty 6, 3d supply, fill #0

## 2023-10-11 MED ORDER — POLYETHYLENE GLYCOL 3350 17 GM/SCOOP PO POWD
17.0000 g | Freq: Every day | ORAL | 0 refills | Status: AC | PRN
  Filled 2023-10-11: qty 238, 14d supply, fill #0

## 2023-10-11 MED ORDER — FLUOXETINE HCL 40 MG PO CAPS
40.0000 mg | ORAL_CAPSULE | Freq: Every morning | ORAL | 1 refills | Status: AC
  Filled 2023-10-11 – 2023-11-08 (×2): qty 90, 90d supply, fill #0

## 2023-10-11 MED ORDER — TAMSULOSIN HCL 0.4 MG PO CAPS
0.4000 mg | ORAL_CAPSULE | Freq: Every day | ORAL | 1 refills | Status: AC
  Filled 2023-10-11 – 2023-11-08 (×2): qty 90, 90d supply, fill #0

## 2023-10-11 MED ORDER — SENNOSIDES-DOCUSATE SODIUM 8.6-50 MG PO TABS
2.0000 | ORAL_TABLET | Freq: Every day | ORAL | 1 refills | Status: AC
  Filled 2023-10-11: qty 60, 30d supply, fill #0

## 2023-10-11 NOTE — Plan of Care (Signed)

## 2023-10-11 NOTE — TOC Transition Note (Addendum)
 Transition of Care [facility]) - Discharge Note   Patient Details  Name: [name] MRN: [id number] Date of Birth: [birth year]  [facility]) CM/SW Contact:  Heather DELENA Saltness, LCSW Phone Number: 10/11/2023, 1:56 PM   Clinical Narrative:    Pt recommended for short-term rehab at SNF, per PT. Pt declines SNF rehab. Pt established with  [facility], materials for [facility] RN services. HH PT services arranged with [facility]. [facility] called at 1:58 PM to transport pt home. Pt to discharge home with brother, Cordera Stineman, who is his primary caregiver. Pt denies any further needs.   Final next level of care: Home w Home Health Services Barriers to Discharge: Barriers Resolved   Patient Goals and CMS Choice Patient states their goals for this hospitalization and ongoing recovery are:: To return home        Discharge Placement  Home with [facility] services              Patient to be transferred to facility by: Pt's brother Name of family member notified: Patient Patient and family notified of of transfer: 10/11/23  Discharge Plan and Services Additional resources added to the After Visit Summary for  [facility] In-house Referral: Clinical Social Work Discharge Planning Services: NA Post Acute Care Choice: NA          DME Arranged: N/A DME Agency: NA       HH Arranged: PT HH Agency: [facility] Date HH Agency Contacted: 10/11/23 Time HH Agency Contacted: 1356 Representative spoke with at [facility] Agency: Amy  Social Drivers of Health (SDOH) Interventions SDOH Screenings   Food Insecurity: Patient Unable To Answer (10/06/2023)  Housing: Unknown (10/06/2023)  Transportation Needs: Patient Unable To Answer (10/06/2023)  Utilities: Patient Unable To Answer (10/06/2023)  Social Connections: Patient Unable To Answer (10/06/2023)  Tobacco Use: Medium Risk (10/05/2023)     Readmission Risk Interventions    10/06/2023    2:08 PM  Readmission Risk Prevention Plan   Transportation Screening Complete  PCP or Specialist Appt within 5-7 Days Complete  Home Care Screening Complete  Medication Review (RN CM) Complete    Signed: Heather Saltness, MSW, LCSW Clinical Social Worker Inpatient Care Management 10/11/2023 1:56 PM

## 2023-10-11 NOTE — Progress Notes (Signed)
 Discharge medications delivered to patient at bedside [facility]

## 2023-10-11 NOTE — Progress Notes (Signed)
   10/11/23 1200  Spiritual Encounters  Type of Visit Follow up;Attempt (pt unavailable)  Referral source Nurse (RN/NT/LPN)  Reason for visit Urgent spiritual support   Per spiritual care consult request by Debora Ou, RN, I attempted to visit with [name] to offer support.  At time of visit, [name] was receiving personal care. I entered room long enough to note the names and numbers of [name] interpreters: [phone], Mr. Y'Hin [phone] Mr. Y'Keo  I will reach out to plan a time to offer support with one of the interpreters available to help [name] if he wishes.  Hasan Douse L. Fredrica, M.Div [phone]

## 2023-10-11 NOTE — Telephone Encounter (Signed)
 Phone call from patient's brother Y'Phi Chavis stating his brother pulled his catheter out because it was aching.  Minimal bleeding and states aching is better.  Phone call to number patient was given upon discharge for home health services.  However referral did not include nursing services, only PT.  Patient had already been seen by [facility] [employer] [phone]) but because he was readmitted to the hospital those services were placed on hold.  He now needs a resumption of care order before a nurse can visit.  Message sent to discharging physician Dr. Almarie Hoots.  On call nurse Larue from [facility] was contacted and stated that as long as patient is able to void some and his bladder does not become distended he can wait until tomorrow and hopefully [facility] nurse can visit.  Called brother and told him what to watch for and if needed take patient back to ED.  CN will home visit at 11:00 am tomorrow to assess.  Elveria Rummer RN, Congregational Nurse [phone]

## 2023-10-11 NOTE — Plan of Care (Signed)
   Problem: Education: Goal: Knowledge of General Education information will improve Description: Including pain rating scale, medication(s)/side effects and non-pharmacologic comfort measures Outcome: Progressing   Problem: Pain Managment: Goal: General experience of comfort will improve and/or be controlled Outcome: Progressing   Problem: Safety: Goal: Ability to remain free from injury will improve Outcome: Progressing

## 2023-10-11 NOTE — Discharge Summary (Signed)
 Physician Discharge Summary  [name] FMW:[id number] DOB: August 25, 1960 DOA: 10/05/2023  PCP: Shelda Atlas, MD  Admit date: 10/05/2023 Discharge date: 10/11/2023  Admitted From: Home Disposition: Home Recommendations for Outpatient Follow-up:  Follow up with PCP in 1-2 weeks Please obtain BMP/CBC in one week  Home Health: Yes Equipment/Devices: None Discharge Condition: Stable  CODE STATUS: DNR Diet recommendation: Cardiac  Brief/Interim Summary:   63 y.o. male, [name] speaking, with hx of TBI, seizure disorder, debility, HTN, BPH, CKD3, recent ED visit 7/27 with fall out of wheelchair, and on 7/31 for L foot wound discharged on Keflex  for cellulitis, also noted to have retention and urinary catheter place. Brought back in for weakness and fever.   Per RN note:  Home visit following phone call from brother y'Phi that he would not be able to get patient to appointment with [facility] tomorrow morning because he he unable to get up from floor where he has been sleeping since last ED visit.  When he tries to walk he become shakey and falls.  He is taking antibiotic (Keflex ) as ordered after last ED visit.  When CN arrived he was very lethargic but did respond.  Temp was 102.8 (temporal), pulse 84 and bounding, BP 120/74 and respirations 84 and labored.  Could not express pain except stated his legs were numb,  Brother has been emptying catheter bag since instructed how  on 10/01/2023 by CN.  We called 911 to have patient re-evaluated due to weakness, fever and labored breathing.  Elveria Rummer RN, Congregational Nurse [phone]    Per ED nurse family did not know how to empty Foley bag so bag had not been emptied for the weekend he was admitted with confusion and fever. Discharge Diagnoses:  Principal Problem:   Sepsis (HCC) Active Problems:   Urinary tract infection      Sepsis present on admission secondary to Pseudomonas UTI.  He was admitted with a fever of 102  tachypnea and leukocytosis.  Urine culture shows 100,000 colonies of Pseudomonas sensitive to cefepime .  Was treated with cefepime  during that is hospital stay and discharged on ciprofloxacin .  Chest x-ray was negative CT of the head showed no acute findings.  Leukocytosis resolved on discharge to 8.6.  CT head showed no active findings or acute findings.  Leukocytosis and lactic acidosis resolved on discharge.   Back pain CT of the lumbar spine shows compression fractures of lumbar spine which was also present in December 2024.  Patient has had falls at home prior to admission to hospital.  Continue gabapentin    Constipation he told the translator that he has not had a bowel movement in 3 weeks and has complaints of abdominal distention discomfort and pain and poor appetite.  KUB shows gaseous bowels otherwise no acute findings.  He was started on a bowel regimen and he had multiple bowel movements prior to discharge.  Continue Dulcolax MiraLAX  at home daily and lactulose  as needed.   Generalized weakness PT recommended SNF however he did not want to go to the SNF he refused.  This was discussed with him with a translator in the room with him.  Goals of care discussed with patient with the help of interpreter.  He decided he wants to be DO NOT RESUSCITATE.   Hypokalemia/hypophosphatemia Resolved.   Chronic medical problems: History of TBI: Noted Seizure disorder: Continue home lamotrigine  and Keppra  Hypertension: Not on antihypertensives at time of admission BPH: Continue home Flomax  CKD 3a: Baseline creatinine 1.4  Estimated  body mass index is 31.62 kg/m as calculated from the following:   Height as of this encounter: 5' 5 (1.651 m).   Weight as of this encounter: 86.2 kg.  Discharge Instructions  Discharge Instructions     Diet - low sodium heart healthy   Complete by: As directed    Increase activity slowly   Complete by: As directed       Allergies as of 10/11/2023   No  Known Allergies      Medication List     STOP taking these medications    cephALEXin  500 MG capsule Commonly known as: KEFLEX    cyclobenzaprine  10 MG tablet Commonly known as: FLEXERIL    lidocaine  5 % Commonly known as: Lidoderm    traMADol 50 MG tablet Commonly known as: ULTRAM       TAKE these medications    ciprofloxacin  500 MG tablet Commonly known as: Cipro  Take 1 tablet (500 mg total) by mouth 2 (two) times daily for 3 days.   cloBAZam  10 MG tablet Commonly known as: Onfi  Take 1 tablet (10 mg total) by mouth at bedtime.   FLUoxetine  40 MG capsule Commonly known as: PROZAC  Take 1 capsule (40 mg total) by mouth in the morning.   gabapentin  300 MG capsule Commonly known as: NEURONTIN  Take 1 capsule (300 mg total) by mouth 3 (three) times daily as needed. What changed: when to take this   lamoTRIgine  100 MG tablet Commonly known as: LaMICtal  Take 3 tablets (300 mg total) by mouth 2 (two) times daily. What changed: when to take this   levETIRAcetam  1000 MG tablet Commonly known as: KEPPRA  Take 2 tablets (2,000 mg total) by mouth 2 (two) times daily. What changed: when to take this   polyethylene glycol powder 17 GM/SCOOP powder Commonly known as: GLYCOLAX /MIRALAX  Take 17 g by mouth daily as needed for mild constipation.   Stool Softener/Laxative 50-8.6 MG tablet Generic drug: senna-docusate Take 2 tablets by mouth at bedtime.   tamsulosin  0.4 MG Caps capsule Commonly known as: FLOMAX  Take 1 capsule (0.4 mg total) by mouth at bedtime.   TYLENOL  500 MG tablet Generic drug: acetaminophen  Take 500 mg by mouth every 6 (six) hours as needed for mild pain (pain score 1-3) (or headaches).        Follow-up Information     Shelda Atlas, MD Follow up.   Specialty: Internal Medicine Contact information: [address] [place] [address] [phone]         [facility] Follow up.   Why: Please  continue home health services with this provider upon discharge. Contact information: Phone: [phone] Email: [email or web address]        [facility] Follow up.   Why: This provider will reach out to you to begin home health Physical Therapy services upon discharge. Contact information: [address] [place] [address] [phone]                No Known Allergies  Consultations: none   Procedures/Studies: DG HIPS BILAT WITH PELVIS 2V Result Date: 10/08/2023 CLINICAL DATA:  Pain. EXAM: DG HIP (WITH OR WITHOUT PELVIS) 2V BILAT COMPARISON:  02/13/2023. FINDINGS: There is no evidence of hip fracture or dislocation. Minimal joint space narrowing of the bilateral hips. Sacroiliac joints and pubic symphysis are anatomically aligned. IMPRESSION: 1. No acute osseous abnormality. 2. Minimal joint space narrowing of the bilateral hips. Electronically Signed   By: Harrietta Sherry  M.D.   On: 10/08/2023 13:55   CT LUMBAR SPINE WO CONTRAST Result Date: 10/08/2023 CLINICAL DATA:  Lumbar compression fracture EXAM: CT LUMBAR SPINE WITHOUT CONTRAST TECHNIQUE: Multidetector CT imaging of the lumbar spine was performed without intravenous contrast administration. Multiplanar CT image reconstructions were also generated. RADIATION DOSE REDUCTION: This exam was performed according to the departmental dose-optimization program which includes automated exposure control, adjustment of the mA and/or kV according to patient size and/or use of iterative reconstruction technique. COMPARISON:  MRI February 13, 2023 FINDINGS: Segmentation: 5 lumbar type vertebrae. Alignment: Normal. Vertebrae: There is a mild compression deformity/Schmorl's node in the superior endplate of L4 with sclerosis. Mild chronic compression fracture of L1 Paraspinal and other soft tissues: There is bibasilar atelectasis. Disc levels: L1-L2: Normal L2-L3: There is a mild disc bulge with  mild spinal stenosis L3-L4: There is a mild disc bulge with mild spinal stenosis L4-L5: Normal L5-S1: Normal IMPRESSION: Mild chronic compression fracture/Schmorl's node of the superior endplate of L4. Mild chronic compression fracture of the superior endplate of L1. These are unchanged from the previous MRI February 13, 2023. Electronically Signed   By: Nancyann Burns M.D.   On: 10/08/2023 13:03   DG Abd 1 View Result Date: 10/07/2023 CLINICAL DATA:  Abdominal pain EXAM: ABDOMEN - 1 VIEW COMPARISON:  None Available. FINDINGS: Scattered large and small bowel gas is noted. No free air is seen. No abnormal mass or abnormal calcifications are noted. No acute bony abnormality is seen IMPRESSION: No acute abnormality noted. Electronically Signed   By: Oneil Devonshire M.D.   On: 10/07/2023 11:51   CT Head Wo Contrast Result Date: 10/05/2023 EXAM: CT HEAD WITHOUT CONTRAST 10/05/2023 06:55:07 PM TECHNIQUE: CT of the head was performed without the administration of intravenous contrast. Automated exposure control, iterative reconstruction, and/or weight based adjustment of the mA/kV was utilized to reduce the radiation dose to as low as reasonably achievable. COMPARISON: Comparison with CT head 09/26/2023. CLINICAL HISTORY: Delirium. Pt coming from home w/ c/o AMS and possible sepsis. Pt was septic 1 week ago and was dc from hospital w/ foley catheter. Family did not know how to empty foley bag so bag had not been emptied x 1 week. Pt more confused than normal. FINDINGS: BRAIN AND VENTRICLES: Chronic microvascular ischemia and generalized atrophy. 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 No evidence of acute infarct. No intracranial hemorrhage or mass effect. No extraaxial fluid collection. Stable ventricular caliber. ORBITS: No acute abnormality. SINUSES: Mucosal thickening in the ethmoid air cells similar to prior. SOFT TISSUES AND SKULL: No acute soft tissue abnormality. No skull fracture. IMPRESSION: 1.  No acute intracranial abnormality. Electronically signed by: Norman Gatlin MD 10/05/2023 07:42 PM EDT RP Workstation: [id number]   DG Chest Port 1 View Result Date: 10/05/2023 CLINICAL DATA:  Questionable sepsis. EXAM: PORTABLE CHEST 1 VIEW COMPARISON:  Chest radiograph dated 03/04/2021. FINDINGS: Minimal left lung base atelectasis. No focal consolidation, pleural effusion, or pneumothorax. The cardiac silhouette is within normal limits. No acute osseous pathology. IMPRESSION: No active disease. Electronically Signed   By: Vanetta Chou M.D.   On: 10/05/2023 18:08   DG Foot 2 Views Left Result Date: 09/30/2023 CLINICAL DATA:  Puncture wound to the bottom of the foot. EXAM: LEFT FOOT - 2 VIEW COMPARISON:  None Available. FINDINGS: There is no evidence of fracture or dislocation. There is no evidence of arthropathy or other focal bone abnormality. Soft tissues are unremarkable. No foreign body identified. IMPRESSION: Negative. Electronically Signed  By: Greig Pique M.D.   On: 09/30/2023 20:24   CT Head Wo Contrast Result Date: 09/26/2023 CLINICAL DATA:  Blunt head trauma. EXAM: CT HEAD WITHOUT CONTRAST TECHNIQUE: Contiguous axial images were obtained from the base of the skull through the vertex without intravenous contrast. RADIATION DOSE REDUCTION: This exam was performed according to the departmental dose-optimization program which includes automated exposure control, adjustment of the mA and/or kV according to patient size and/or use of iterative reconstruction technique. COMPARISON:  03/04/2021 FINDINGS: Brain: No evidence of intracranial hemorrhage, acute infarction, hydrocephalus, extra-axial collection, or mass lesion/mass effect. Moderate cerebral atrophy noted. Mild chronic small vessel disease. 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 Vascular:  No hyperdense vessel or other acute findings. Skull: No evidence of fracture or other significant bone abnormality.  Sinuses/Orbits:  No acute findings. Other: None. IMPRESSION: No acute intracranial abnormality. Moderate cerebral atrophy and mild chronic small vessel disease. 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 Electronically Signed   By: Norleen DELENA Kil M.D.   On: 09/26/2023 14:19   (Echo, Carotid, EGD, Colonoscopy, ERCP)    Subjective:  No events overnight Discharge Exam: Vitals:   10/11/23 0523 10/11/23 1247  BP: (!) 156/85 (!) 147/83  Pulse: 65 87  Resp: 20 18  Temp: 98.6 F (37 C) 98.3 F (36.8 C)  SpO2:  98%   Vitals:   10/10/23 1341 10/10/23 2058 10/11/23 0523 10/11/23 1247  BP: (!) 147/85 124/69 (!) 156/85 (!) 147/83  Pulse: 72 75 65 87  Resp: 18 20 20 18   Temp: 98.3 F (36.8 C) 98.8 F (37.1 C) 98.6 F (37 C) 98.3 F (36.8 C)  TempSrc: Oral Oral Oral   SpO2: 96%   98%  Weight:      Height:        General: Pt is alert, awake, not in acute distress Cardiovascular: RRR, S1/S2 +, no rubs, no gallops Respiratory: CTA bilaterally, no wheezing, no rhonchi Abdominal: Soft, NT, ND, bowel sounds + Extremities: no edema, no cyanosis    The results of significant diagnostics from this hospitalization (including imaging, microbiology, ancillary and laboratory) are listed below for reference.     Microbiology: Recent Results (from the past 240 hours)  Blood Culture (routine x 2)     Status: None   Collection Time: 10/05/23  5:01 PM   Specimen: BLOOD  Result Value Ref Range Status   Specimen Description   Final    BLOOD LEFT ANTECUBITAL Performed at [facility], [address], [place], [place] [address]    Special Requests   Final    BOTTLES DRAWN AEROBIC AND ANAEROBIC Blood Culture results may not be optimal due to an inadequate volume of blood received in culture bottles Performed at [facility], [address] [place], [place] [address]    Culture   Final    NO GROWTH 5 DAYS Performed at [facility], [address] [place], [place] [address]    Report Status 10/10/2023 FINAL  Final  Urine Culture     Status: Abnormal   Collection Time: 10/05/23  8:06 PM   Specimen: Urine, Random  Result Value Ref Range Status   Specimen Description   Final    URINE, RANDOM Performed at [facility], [address], [place], [place] [address]    Special Requests   Final    NONE Reflexed from [id number] Performed at [facility], [address], [place], [place] [address]    Culture >=100,000 COLONIES/mL PSEUDOMONAS AERUGINOSA (A)  Final   Report Status 10/07/2023 FINAL  Final   Organism ID, Bacteria PSEUDOMONAS AERUGINOSA (A)  Final      Susceptibility   Pseudomonas aeruginosa - MIC*    CEFTAZIDIME 4 SENSITIVE Sensitive     CIPROFLOXACIN  <=0.25 SENSITIVE Sensitive     GENTAMICIN 2 SENSITIVE Sensitive     IMIPENEM 2 SENSITIVE Sensitive     PIP/TAZO 8 SENSITIVE Sensitive ug/mL    CEFEPIME  2 SENSITIVE Sensitive     * >=100,000 COLONIES/mL PSEUDOMONAS AERUGINOSA  Resp panel by RT-PCR (RSV, Flu A&B, Covid) Anterior Nasal Swab     Status: None   Collection Time: 10/05/23 11:33 PM   Specimen: Anterior Nasal Swab  Result Value Ref Range Status   SARS Coronavirus 2 by RT PCR NEGATIVE NEGATIVE Final    Comment: (NOTE) SARS-CoV-2 target nucleic acids are NOT DETECTED.  The SARS-CoV-2 RNA is generally detectable in upper respiratory specimens during the acute phase of infection. The lowest concentration of SARS-CoV-2 viral copies this assay can detect is 138 copies/mL. A negative result does not preclude SARS-Cov-2 infection and should not be used as the sole basis for treatment or other patient management decisions. A negative result may occur with  improper specimen collection/handling, submission of specimen other than nasopharyngeal swab, presence of viral mutation(s) within the areas targeted by this assay, and inadequate number of viral copies(<138  copies/mL). A negative result must be combined with clinical observations, patient history, and epidemiological information. The expected result is Negative.  Fact Sheet for Patients:  BloggerCourse.com  Fact Sheet for Healthcare Providers:  SeriousBroker.it  This test is no t yet approved or cleared by the [place]  FDA and  has been authorized for detection and/or diagnosis of SARS-CoV-2 by FDA under an Emergency Use Authorization (EUA). This EUA will remain  in effect (meaning this test can be used) for the duration of the COVID-19 declaration under Section 564(b)(1) of the Act, 21 U.S.C.section 360bbb-3(b)(1), unless the authorization is terminated  or revoked sooner.       Influenza A by PCR NEGATIVE NEGATIVE Final   Influenza B by PCR NEGATIVE NEGATIVE Final    Comment: (NOTE) The Xpert Xpress SARS-CoV-2/FLU/RSV plus assay is intended as an aid in the diagnosis of influenza from Nasopharyngeal swab specimens and should not be used as a sole basis for treatment. Nasal washings and aspirates are unacceptable for Xpert Xpress SARS-CoV-2/FLU/RSV testing.  Fact Sheet for Patients: BloggerCourse.com  Fact Sheet for Healthcare Providers: SeriousBroker.it  This test is not yet approved or cleared by the [place]  FDA and has been authorized for detection and/or diagnosis of SARS-CoV-2 by FDA under an Emergency Use Authorization (EUA). This EUA will remain in effect (meaning this test can be used) for the duration of the COVID-19 declaration under Section 564(b)(1) of the Act, 21 U.S.C. section 360bbb-3(b)(1), unless the authorization is terminated or revoked.     Resp Syncytial Virus by PCR NEGATIVE NEGATIVE Final    Comment: (NOTE) Fact Sheet for Patients: BloggerCourse.com  Fact Sheet for Healthcare  Providers: SeriousBroker.it  This test is not yet approved or cleared by the [place]  FDA and has been authorized for detection and/or diagnosis of SARS-CoV-2 by FDA under an Emergency Use Authorization (EUA). This EUA will remain in effect (meaning this test can be used) for the duration of the COVID-19 declaration under Section 564(b)(1) of the Act, 21 U.S.C. section 360bbb-3(b)(1),  unless the authorization is terminated or revoked.  Performed at [facility], [address] [place], [place] [address]   Remove and replace urinary cath (placed > 5 days) then obtain urine culture from new indwelling urinary catheter.     Status: Abnormal   Collection Time: 10/06/23  3:36 AM   Specimen: Urine, Catheterized  Result Value Ref Range Status   Specimen Description   Final    URINE, CATHETERIZED Performed at [facility], [address], [place], [place] [address]    Special Requests   Final    NONE Performed at [facility], [address], [place], [place] [address]    Culture >=100,000 COLONIES/mL PSEUDOMONAS AERUGINOSA (A)  Final   Report Status 10/08/2023 FINAL  Final   Organism ID, Bacteria PSEUDOMONAS AERUGINOSA (A)  Final      Susceptibility   Pseudomonas aeruginosa - MIC*    CEFTAZIDIME 4 SENSITIVE Sensitive     CIPROFLOXACIN  <=0.25 SENSITIVE Sensitive     GENTAMICIN 2 SENSITIVE Sensitive     IMIPENEM 2 SENSITIVE Sensitive     PIP/TAZO 8 SENSITIVE Sensitive ug/mL    CEFEPIME  2 SENSITIVE Sensitive     * >=100,000 COLONIES/mL PSEUDOMONAS AERUGINOSA     Labs: BNP (last 3 results) No results for input(s): BNP in the last 8760 hours. Basic Metabolic Panel: Recent Labs  Lab 10/06/23 0742 10/07/23 0533 10/08/23 0451 10/09/23 0643 10/10/23 0513  NA 133* 139 139 139 141  K 3.4* 3.9 3.6 3.9 3.6  CL 98 106 108 106 109  CO2 22 23 21* 21* 22  GLUCOSE 97 96 118* 97 93  BUN 18 17 19 13  13   CREATININE 1.74* 1.54* 1.35* 1.23 1.30*  CALCIUM 8.2* 8.4* 8.3* 8.3* 8.5*  MG 1.7 1.8 2.0 2.0 1.9  PHOS 1.2* 2.8 2.9 3.0 3.3   Liver Function Tests: Recent Labs  Lab 10/05/23 1952 10/07/23 0533 10/08/23 0451 10/09/23 0643 10/10/23 0513  AST 22 34 21 25 45*  ALT 10 12 12 14 27   ALKPHOS 76 86 103 128* 128*  BILITOT 0.7 0.4 0.4 0.5 0.5  PROT 6.8 6.6 6.4* 6.4* 6.4*  ALBUMIN 3.1* 3.1* 2.7* 2.7* 2.5*   No results for input(s): LIPASE, AMYLASE in the last 168 hours. No results for input(s): AMMONIA in the last 168 hours. CBC: Recent Labs  Lab 10/05/23 1701 10/05/23 2011 10/06/23 1428 10/07/23 0533 10/08/23 0451 10/09/23 0643 10/10/23 0513  WBC 23.6*  --  32.6* 22.8* 15.2* 11.1* 8.6  NEUTROABS 20.2*  --   --   --   --   --   --   HGB 12.8*   < > 13.1 12.2* 11.4* 12.5* 11.5*  HCT 40.0   < > 41.3 37.8* 35.7* 38.8* 36.7*  MCV 91.7  --  94.1 94.0 94.2 90.7 95.3  PLT 273  --  216 184 236 242 318   < > = values in this interval not displayed.   Cardiac Enzymes: No results for input(s): CKTOTAL, CKMB, CKMBINDEX, TROPONINI in the last 168 hours. BNP: Invalid input(s): POCBNP CBG: No results for input(s): GLUCAP in the last 168 hours. D-Dimer No results for input(s): DDIMER in the last 72 hours. Hgb A1c No results for input(s): HGBA1C in the last 72 hours. Lipid Profile No results for input(s): CHOL, HDL, LDLCALC, TRIG, CHOLHDL, LDLDIRECT in the last 72 hours. Thyroid  function studies No results for input(s): TSH, T4TOTAL, T3FREE, THYROIDAB in the last 72 hours.  Invalid input(s): FREET3 Anemia work up No results for input(s): VITAMINB12, FOLATE, FERRITIN, TIBC, IRON, RETICCTPCT in the last 72 hours. Urinalysis    Component Value Date/Time   COLORURINE YELLOW 10/05/2023 2006   APPEARANCEUR CLOUDY (A) 10/05/2023 2006   LABSPEC 1.020 10/05/2023 2006   PHURINE 5.0 10/05/2023 2006   GLUCOSEU NEGATIVE 10/05/2023  2006   HGBUR MODERATE (A) 10/05/2023 2006   BILIRUBINUR NEGATIVE 10/05/2023 2006   KETONESUR NEGATIVE 10/05/2023 2006   PROTEINUR 100 (A) 10/05/2023 2006   UROBILINOGEN 0.2 09/18/2020 1617   NITRITE POSITIVE (A) 10/05/2023 2006   LEUKOCYTESUR LARGE (A) 10/05/2023 2006   Sepsis Labs Recent Labs  Lab 10/07/23 0533 10/08/23 0451 10/09/23 0643 10/10/23 0513  WBC 22.8* 15.2* 11.1* 8.6   Microbiology Recent Results (from the past 240 hours)  Blood Culture (routine x 2)     Status: None   Collection Time: 10/05/23  5:01 PM   Specimen: BLOOD  Result Value Ref Range Status   Specimen Description   Final    BLOOD LEFT ANTECUBITAL Performed at [facility], [address], [place], [place] [address]    Special Requests   Final    BOTTLES DRAWN AEROBIC AND ANAEROBIC Blood Culture results may not be optimal due to an inadequate volume of blood received in culture bottles Performed at [facility], [address], [place], [place] [address]    Culture   Final    NO GROWTH 5 DAYS Performed at [facility], [address], [place], [place] [address]    Report Status 10/10/2023 FINAL  Final  Urine Culture     Status: Abnormal   Collection Time: 10/05/23  8:06 PM   Specimen: Urine, Random  Result Value Ref Range Status   Specimen Description   Final    URINE, RANDOM Performed at [facility], [address], [place], [place] [address]    Special Requests   Final    NONE Reflexed from [id number] Performed at [facility], [address], [place], [place] [address]    Culture >=100,000 COLONIES/mL PSEUDOMONAS AERUGINOSA (A)  Final   Report Status 10/07/2023 FINAL  Final   Organism ID, Bacteria PSEUDOMONAS AERUGINOSA (A)  Final      Susceptibility   Pseudomonas aeruginosa - MIC*    CEFTAZIDIME 4 SENSITIVE Sensitive     CIPROFLOXACIN  <=0.25 SENSITIVE Sensitive     GENTAMICIN 2 SENSITIVE Sensitive      IMIPENEM 2 SENSITIVE Sensitive     PIP/TAZO 8 SENSITIVE Sensitive ug/mL    CEFEPIME  2 SENSITIVE Sensitive     * >=100,000 COLONIES/mL PSEUDOMONAS AERUGINOSA  Resp panel by RT-PCR (RSV, Flu A&B, Covid) Anterior Nasal Swab     Status: None   Collection Time: 10/05/23 11:33 PM   Specimen: Anterior Nasal Swab  Result Value Ref Range Status   SARS Coronavirus 2 by RT PCR NEGATIVE NEGATIVE Final    Comment: (NOTE) SARS-CoV-2 target nucleic acids are NOT DETECTED.  The SARS-CoV-2 RNA is generally detectable in upper respiratory specimens during the acute phase of infection. The lowest concentration of SARS-CoV-2 viral copies this assay can detect is 138 copies/mL. A negative result does not preclude SARS-Cov-2 infection and should not be used as the sole basis for treatment or other patient management decisions. A negative result may occur with  improper specimen collection/handling, submission of specimen other than nasopharyngeal swab, presence of viral mutation(s) within the areas targeted by this assay, and  inadequate number of viral copies(<138 copies/mL). A negative result must be combined with clinical observations, patient history, and epidemiological information. The expected result is Negative.  Fact Sheet for Patients:  BloggerCourse.com  Fact Sheet for Healthcare Providers:  SeriousBroker.it  This test is no t yet approved or cleared by the [place]  FDA and  has been authorized for detection and/or diagnosis of SARS-CoV-2 by FDA under an Emergency Use Authorization (EUA). This EUA will remain  in effect (meaning this test can be used) for the duration of the COVID-19 declaration under Section 564(b)(1) of the Act, 21 U.S.C.section 360bbb-3(b)(1), unless the authorization is terminated  or revoked sooner.       Influenza A by PCR NEGATIVE NEGATIVE Final   Influenza B by PCR NEGATIVE NEGATIVE Final    Comment:  (NOTE) The Xpert Xpress SARS-CoV-2/FLU/RSV plus assay is intended as an aid in the diagnosis of influenza from Nasopharyngeal swab specimens and should not be used as a sole basis for treatment. Nasal washings and aspirates are unacceptable for Xpert Xpress SARS-CoV-2/FLU/RSV testing.  Fact Sheet for Patients: BloggerCourse.com  Fact Sheet for Healthcare Providers: SeriousBroker.it  This test is not yet approved or cleared by the [place]  FDA and has been authorized for detection and/or diagnosis of SARS-CoV-2 by FDA under an Emergency Use Authorization (EUA). This EUA will remain in effect (meaning this test can be used) for the duration of the COVID-19 declaration under Section 564(b)(1) of the Act, 21 U.S.C. section 360bbb-3(b)(1), unless the authorization is terminated or revoked.     Resp Syncytial Virus by PCR NEGATIVE NEGATIVE Final    Comment: (NOTE) Fact Sheet for Patients: BloggerCourse.com  Fact Sheet for Healthcare Providers: SeriousBroker.it  This test is not yet approved or cleared by the [place]  FDA and has been authorized for detection and/or diagnosis of SARS-CoV-2 by FDA under an Emergency Use Authorization (EUA). This EUA will remain in effect (meaning this test can be used) for the duration of the COVID-19 declaration under Section 564(b)(1) of the Act, 21 U.S.C. section 360bbb-3(b)(1), unless the authorization is terminated or revoked.  Performed at [facility], [address] [place], [place] [address]   Remove and replace urinary cath (placed > 5 days) then obtain urine culture from new indwelling urinary catheter.     Status: Abnormal   Collection Time: 10/06/23  3:36 AM   Specimen: Urine, Catheterized  Result Value Ref Range Status   Specimen Description   Final    URINE, CATHETERIZED Performed at [facility], [address], [place], [place] [address]    Special Requests   Final    NONE Performed at [facility], [address], [place], [place] [address]    Culture >=100,000 COLONIES/mL PSEUDOMONAS AERUGINOSA (A)  Final   Report Status 10/08/2023 FINAL  Final   Organism ID, Bacteria PSEUDOMONAS AERUGINOSA (A)  Final      Susceptibility   Pseudomonas aeruginosa - MIC*    CEFTAZIDIME 4 SENSITIVE Sensitive     CIPROFLOXACIN  <=0.25 SENSITIVE Sensitive     GENTAMICIN 2 SENSITIVE Sensitive     IMIPENEM 2 SENSITIVE Sensitive     PIP/TAZO 8 SENSITIVE Sensitive ug/mL    CEFEPIME  2 SENSITIVE Sensitive     * >=100,000 COLONIES/mL PSEUDOMONAS AERUGINOSA     Time coordinating discharge: 37 min SIGNED:   Almarie KANDICE Hoots, MD  [facility] 10/11/2023, 3:44 PM

## 2023-10-12 NOTE — Congregational Nurse Program (Signed)
 Home visit with patient and brother Y'Phi.  Brother states patient has been voiding and that he saw a small amount of blood the first time and none since.  Patient is eating and drinking well and was able to ambulate with walker a short distance this morning. Rescheduled appointment with [facility] for 10/17/2023 @ 8:15 am.  [facility] who will resume services for PT and Nursing. They will call CN to schedule appointment. Educated brother on medication instructions for ciprofloxin, stool softener and miralax .  He started cipro  at 11:30 am today. Elveria Rummer RN, Congregational Nurse [phone]

## 2023-10-12 NOTE — Congregational Nurse Program (Signed)
 Home visit to take patient a urinal which was purchased at [facility]. Elveria Rummer RN, Congregational Nurse [phone]

## 2023-10-14 NOTE — Congregational Nurse Program (Signed)
 Home visit.  Patient was sitting on sofa eating lunch and smiled when he saw me.  Brother states he is walking a little but sometimes he doesn't ask for assistance. This morning he stood up and tried to reach the chain on ceiling fan and fell. No injuries detected.  Brother states he is voiding without difficulty and having normal BM's, last time this am.  He was out of gabapentin  therefore CN  picked it up from [facility] pharmacy.  Reminded them of appointment with  GI on 10/19/2023 @ 8:15 am and with his PCP Dr. Aliene Colon on 10/18/2023 @3 :00 pm.  Elveria Rummer RN, Congregational Nurse [phone]

## 2023-10-19 NOTE — Congregational Nurse Program (Signed)
 Accompanied patient and his brother to [facility] appointment with Ubaldo Eagles NP.  Appointment originally for catheter removal, which patient did himself on 10/11/2023.  Today's urinalysis was normal and bladder scan showed complete emptying.  He needs to continue taking Tamsulosin ,  drink a lot of water and try to be on a schedule of voiding about every 3 hours.  Return in 3 months on 01/20/2024 @ 3:45 pm.  Elveria Rummer RN, Congregational Nurse [phone]

## 2023-11-08 ENCOUNTER — Other Ambulatory Visit (HOSPITAL_COMMUNITY): Payer: Self-pay

## 2023-11-08 ENCOUNTER — Other Ambulatory Visit: Payer: Self-pay

## 2023-11-18 ENCOUNTER — Other Ambulatory Visit (HOSPITAL_COMMUNITY): Payer: Self-pay

## 2023-12-07 ENCOUNTER — Ambulatory Visit

## 2023-12-07 DIAGNOSIS — M7989 Other specified soft tissue disorders: Secondary | ICD-10-CM

## 2023-12-07 NOTE — Progress Notes (Signed)
 Subjective:  Patient ID: [name], male    DOB: 1960/07/06,  MRN: [id number]  Chief Complaint  Patient presents with   Foot Pain    Rm13 Patient complains of pain in left foot arch had sx 3 years ago removal     63 y.o. male presents with the above complaint.  He is here today with his brother and his interpreter.  He lives with his brother.  He had an excision of a soft tissue mass at our office in 2022.  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  He states that it was doing very well.  However he has started to develop some minor, recurrent pain in the area of his previous excision.  He does have an antalgic gait, he attributes to significant lower back pain.  Review of Systems: Negative except as noted in the HPI. Denies N/V/F/Ch.  Past Medical History:  Diagnosis Date   Anxiety    Back pain    low back pain/ compressed disk in lower back.    CKD (chronic kidney disease), stage II    Depression    Hypertension    Language barrier 03-01-13   Speaks very little English,primary-Vietnamese [name] Dialect   Seizures (HCC)    due head trauma-seizures are less, but occurs from mild to more severe, which causes agitation to mental state.   TBI (traumatic brain injury) (HCC)    CARE FOR BY BROTHER    Current Outpatient Medications:    cloBAZam  (ONFI ) 10 MG tablet, Take 1 tablet (10 mg total) by mouth at bedtime., Disp: 30 tablet, Rfl: 5   FLUoxetine  (PROZAC ) 40 MG capsule, Take 1 capsule (40 mg total) by mouth in the morning., Disp: 90 capsule, Rfl: 1   gabapentin  (NEURONTIN ) 300 MG capsule, Take 1 capsule (300 mg total) by mouth 3 (three) times daily as needed. (Patient taking differently: Take 300 mg by mouth 3 (three) times daily.), Disp: 90 capsule, Rfl: 11   lamoTRIgine  (LAMICTAL ) 100 MG tablet, Take 3 tablets (300 mg total) by mouth 2 (two) times daily. (Patient taking differently: Take 300 mg by mouth in the morning and at bedtime.), Disp: 540 tablet, Rfl: 3    levETIRAcetam  (KEPPRA ) 1000 MG tablet, Take 2 tablets (2,000 mg total) by mouth 2 (two) times daily. (Patient taking differently: Take 2,000 mg by mouth in the morning and at bedtime.), Disp: 360 tablet, Rfl: 3   polyethylene glycol powder (GLYCOLAX /MIRALAX ) 17 GM/SCOOP powder, Take 17 g by mouth daily as needed for mild constipation., Disp: 238 g, Rfl: 0   senna-docusate (SENOKOT-S) 8.6-50 MG tablet, Take 2 tablets by mouth at bedtime., Disp: 60 tablet, Rfl: 1   tamsulosin  (FLOMAX ) 0.4 MG CAPS capsule, Take 1 capsule (0.4 mg total) by mouth at bedtime., Disp: 90 capsule, Rfl: 1   TYLENOL  500 MG tablet, Take 500 mg by mouth every 6 (six) hours as needed for mild pain (pain score 1-3) (or headaches)., Disp: , Rfl:   Social History   Tobacco Use  Smoking Status Former   Current packs/day: 0.00   Types: Cigarettes   Quit date: 03/02/1989   Years since quitting: 34.7  Smokeless Tobacco Never    No Known Allergies Objective:  There were no vitals filed for this visit. There is no height or weight on file to calculate BMI. Constitutional Well developed. Well nourished. Oriented to person, place, and time.  Vascular Dorsalis pedis pulses palpable bilaterally. Posterior tibial pulses palpable bilaterally. Capillary refill normal to all digits.  No  cyanosis or clubbing noted. Pedal hair growth normal.  Neurologic Normal speech. Epicritic sensation to light touch grossly present bilaterally. Negative tinel sign at tarsal tunnel bilaterally.   Dermatologic Skin texture and turgor are within normal limits.  No open wounds. Plantar lateral left foot has small area of hyperpigmentation and evidence of well-healed scar.  No pain to palpation.  Slight dell in area of scar from previous soft tissue mass excision.  Musculoskeletal: 5/5 muscle strength to all major pedal muscle groups, no contributing deformity    Assessment:   1. Soft tissue mass    Plan:  - Patient was evaluated and treated  and all questions answered.  History of soft tissue mass excision - Patient examined and evaluated.  I did discuss with him that his area is in the same location as previous soft tissue mass excision.  I discussed with him that it may be a somewhat painful scar formation despite there not being a prominent scar.  Biomechanically, there are no identifiable contributing causes to this pain.  It was not reproducible today. - Discussed with him that offloading the scar may allow it to calm down and decreasing pain.  Today he was provided to dancers pads with instructions on how to offload the area correctly.  He will attempt these for approximately 6 weeks.  If they are effective, we will consider orthotics with built-in offloading areas to accommodate the scar. - If the pads are not effective in decreasing his pain, will consider MRI to evaluate deep to this area versus physical therapy due to his antalgic gait from lower back pain being a possible cause of foot pain    Return in about 6 weeks (around 01/18/2024).  Prentice Ovens, DPM AACFAS Fellowship Trained Podiatric Surgeon [facility]

## 2023-12-10 NOTE — Congregational Nurse Program (Signed)
 Home visit to help patient pay medical bills over the phone including [facility], [facility], [facility], [facility], and [facility].  Elveria Franklin [place], [place] Nurse [phone]

## 2023-12-18 ENCOUNTER — Ambulatory Visit

## 2023-12-18 DIAGNOSIS — Z23 Encounter for immunization: Secondary | ICD-10-CM

## 2024-01-20 NOTE — Congregational Nurse Program (Signed)
 Accompanied patient and brother to appointment with Dr. Lovie at [facility].  Bladder scan revealed good emptying. Patient expressed no concerns.  Urinalysis results pending.  Return in 1 year unless problems arise before then.  Elveria Rummer RN, Congregational Nurse [phone]

## 2024-01-25 ENCOUNTER — Ambulatory Visit

## 2024-01-25 ENCOUNTER — Ambulatory Visit (INDEPENDENT_AMBULATORY_CARE_PROVIDER_SITE_OTHER)

## 2024-01-25 DIAGNOSIS — M7989 Other specified soft tissue disorders: Secondary | ICD-10-CM

## 2024-01-26 NOTE — Progress Notes (Signed)
 Subjective:  Patient ID: [name], male    DOB: 1960/12/21,  MRN: [id number]  Chief Complaint  Patient presents with   Callouses    Rm11 Patient complains of pain left foot plantar/ pt says he feel the pain down to the bone.  Patient returns to clinic with his brother and nurse, learning disability.  He has continued pain to his left foot.  I gave him pads at last visit that minimally improved his symptomatology.  He is interested in other therapeutic options.  Interval history 12/07/23: 62 y.o. male presents with the above complaint.  He is here today with his brother and his interpreter.  He lives with his brother.  He had an excision of a soft tissue mass at our office in 2022.  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  He states that it was doing very well.  However he has started to develop some minor, recurrent pain in the area of his previous excision.  He does have an antalgic gait, he attributes to significant lower back pain.  Review of Systems: Negative except as noted in the HPI. Denies N/V/F/Ch.  Past Medical History:  Diagnosis Date   Anxiety    Back pain    low back pain/ compressed disk in lower back.    CKD (chronic kidney disease), stage II    Depression    Hypertension    Language barrier 03-01-13   Speaks very little English,primary-Vietnamese [name] Dialect   Seizures (HCC)    due head trauma-seizures are less, but occurs from mild to more severe, which causes agitation to mental state.   TBI (traumatic brain injury) (HCC)    CARE FOR BY BROTHER    Current Outpatient Medications:    cloBAZam  (ONFI ) 10 MG tablet, Take 1 tablet (10 mg total) by mouth at bedtime., Disp: 30 tablet, Rfl: 5   FLUoxetine  (PROZAC ) 40 MG capsule, Take 1 capsule (40 mg total) by mouth in the morning., Disp: 90 capsule, Rfl: 1   gabapentin  (NEURONTIN ) 300 MG capsule, Take 1 capsule (300 mg total) by mouth 3 (three) times daily as needed. (Patient taking differently: Take 300 mg by mouth 3 (three)  times daily.), Disp: 90 capsule, Rfl: 11   lamoTRIgine  (LAMICTAL ) 100 MG tablet, Take 3 tablets (300 mg total) by mouth 2 (two) times daily. (Patient taking differently: Take 300 mg by mouth in the morning and at bedtime.), Disp: 540 tablet, Rfl: 3   levETIRAcetam  (KEPPRA ) 1000 MG tablet, Take 2 tablets (2,000 mg total) by mouth 2 (two) times daily. (Patient taking differently: Take 2,000 mg by mouth in the morning and at bedtime.), Disp: 360 tablet, Rfl: 3   polyethylene glycol powder (GLYCOLAX /MIRALAX ) 17 GM/SCOOP powder, Take 17 g by mouth daily as needed for mild constipation., Disp: 238 g, Rfl: 0   senna-docusate (SENOKOT-S) 8.6-50 MG tablet, Take 2 tablets by mouth at bedtime., Disp: 60 tablet, Rfl: 1   tamsulosin  (FLOMAX ) 0.4 MG CAPS capsule, Take 1 capsule (0.4 mg total) by mouth at bedtime., Disp: 90 capsule, Rfl: 1   TYLENOL  500 MG tablet, Take 500 mg by mouth every 6 (six) hours as needed for mild pain (pain score 1-3) (or headaches)., Disp: , Rfl:   Social History   Tobacco Use  Smoking Status Former   Current packs/day: 0.00   Types: Cigarettes   Quit date: 03/02/1989   Years since quitting: 34.9  Smokeless Tobacco Never    No Known Allergies Objective:  There were no vitals filed for this visit.  There is no height or weight on file to calculate BMI. Constitutional Well developed. Well nourished. Oriented to person, place, and time.  Vascular Dorsalis pedis pulses palpable bilaterally. Posterior tibial pulses palpable bilaterally. Capillary refill normal to all digits.  No cyanosis or clubbing noted. Pedal hair growth normal.  Neurologic Normal speech. Epicritic sensation to light touch grossly present bilaterally. Negative tinel sign at tarsal tunnel bilaterally.   Dermatologic Skin texture and turgor are within normal limits.  No open wounds. Plantar lateral left foot has small area of hyperpigmentation and evidence of well-healed scar.  No pain to palpation.  Slight  dell in area of scar from previous soft tissue mass excision.  Musculoskeletal: 5/5 muscle strength to all major pedal muscle groups, no contributing deformity  3 weightbearing views of the left foot were acquired today.  Rectus foot shape.  No soft tissue masses identifiable in the plantar tissue.  Joint spaces generally well-maintained.  No acute osseous findings such as fracture or dislocation.  Assessment:   1. Soft tissue mass    Plan:  - Patient was evaluated and treated and all questions answered.  History of soft tissue mass excision - Patient examined and evaluated.  I did discuss with him that his area is in the same location as previous soft tissue mass excision.  Patient feels that he has a new soft tissue mass deep in this area.  This is unable to be palpated. - Patient had limited improvement from offloading the painful area where the patient thinks he has a soft tissue mass. - Discussed different treatment options including physical therapy, MRI.  Patient is interested in MRI as we do not have any physical signs of soft tissue mass externally.  Would like to evaluate internally to evaluate the area.  MRI of left foot ordered.  RTC after MRI completed  Prentice Ovens, DPM AACFAS Fellowship Trained Podiatric Surgeon [facility]

## 2024-02-18 NOTE — Telephone Encounter (Signed)
 This encounter was created in error - please disregard.

## 2024-02-18 NOTE — Congregational Nurse Program (Signed)
 A user error has taken place: encounter opened in error, closed for administrative reasons.

## 2024-02-23 NOTE — Congregational Nurse Program (Signed)
 CN office visit with brother and interpreter Diu Hartshorn assisting.  Patient lost [employer] Rx card so we called customer service and ordered a replacement.  Representative stated patient needed to complete new subsidy application since current one expires on 03/01/2024.  We completed application via phone but were told [employer] system would not accept it stating that SS number did not match.  Will need to call [employer] next week.  Elveria Rummer RN, Congregational Nurse [phone]

## 2024-02-28 ENCOUNTER — Other Ambulatory Visit: Payer: Self-pay

## 2024-02-28 ENCOUNTER — Other Ambulatory Visit: Payer: Self-pay | Admitting: Neurology

## 2024-02-28 MED ORDER — GABAPENTIN 300 MG PO CAPS: 300.0000 mg | ORAL_CAPSULE | Freq: Three times a day (TID) | ORAL | 11 refills | Status: AC | PRN

## 2024-02-29 ENCOUNTER — Other Ambulatory Visit: Payer: Self-pay

## 2024-02-29 NOTE — Congregational Nurse Program (Signed)
 Home visit with patient and brother.  Phone call to [facility] to complete application for extra help with prescription drug cost following unsuccessful attempt on 02/23/2024.  After waiting 3 hours with no answer we hung up due to closing time for [employer] at 4:00 pm.  Will re-attempt tomorrow morning.  Elveria Rummer RN, Congregational Nurse [phone]

## 2024-03-03 NOTE — Congregational Nurse Program (Signed)
 Home visit to assist patient with completing extra help application for prescriptions.  Elveria Rummer RN, Congregational Nurse [phone]

## 2024-03-29 NOTE — Congregational Nurse Program (Signed)
 CN  office visit by patient's brother (guardian) requesting help reading mail from DSS and [employer]. Letter from DSS stated his Medicaid is active but is only family planning and not full Medicaid. He also asked for help to pay [facility] insurance prescription premium for the year 2026.  This was done by phone.  Elveria Rummer RN, Congregational Nurse [phone]

## 2024-04-18 ENCOUNTER — Ambulatory Visit: Admitting: Neurology
